# Patient Record
Sex: Male | Born: 1990 | Race: White | Hispanic: No | Marital: Single | Smoking: Current every day smoker
Health system: Southern US, Community
[De-identification: ages and names within clinical notes are randomized; demographics above are authoritative.]

## PROBLEM LIST (undated history)

## (undated) DIAGNOSIS — F1910 Other psychoactive substance abuse, uncomplicated: Secondary | ICD-10-CM

## (undated) DIAGNOSIS — Z789 Other specified health status: Secondary | ICD-10-CM

## (undated) HISTORY — PX: NO PAST SURGERIES: SHX2092

---
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
## 2012-07-11 ENCOUNTER — Emergency Department (HOSPITAL_COMMUNITY)
Admission: EM | Admit: 2012-07-11 | Discharge: 2012-07-11 | Disposition: A | Discharge status: Home / Self Care | Payer: Self-pay | Attending: Emergency Medicine | Admitting: Emergency Medicine

## 2012-07-11 ENCOUNTER — Encounter (HOSPITAL_COMMUNITY): Payer: Self-pay | Admitting: Emergency Medicine

## 2012-07-11 DIAGNOSIS — L02419 Cutaneous abscess of limb, unspecified: Secondary | ICD-10-CM | POA: Insufficient documentation

## 2012-07-11 DIAGNOSIS — M25569 Pain in unspecified knee: Secondary | ICD-10-CM | POA: Insufficient documentation

## 2012-07-11 DIAGNOSIS — L02415 Cutaneous abscess of right lower limb: Secondary | ICD-10-CM

## 2012-07-11 DIAGNOSIS — L02416 Cutaneous abscess of left lower limb: Secondary | ICD-10-CM

## 2012-07-11 DIAGNOSIS — L03119 Cellulitis of unspecified part of limb: Secondary | ICD-10-CM | POA: Insufficient documentation

## 2012-07-11 MED ORDER — CEFTRIAXONE SODIUM 1 G IJ SOLR
1.0000 g | Freq: Once | INTRAMUSCULAR | Status: AC
  Administered 2012-07-11: 1 g via INTRAMUSCULAR
  Filled 2012-07-11: qty 10

## 2012-07-11 MED ORDER — HYDROCODONE-ACETAMINOPHEN 7.5-325 MG PO TABS: 1.0000 | ORAL_TABLET | ORAL | Status: DC | PRN

## 2012-07-11 MED ORDER — HYDROCODONE-ACETAMINOPHEN 5-325 MG PO TABS
2.0000 | ORAL_TABLET | Freq: Once | ORAL | Status: AC
  Administered 2012-07-11: 2 via ORAL
  Filled 2012-07-11: qty 2

## 2012-07-11 MED ORDER — ONDANSETRON HCL 4 MG PO TABS
4.0000 mg | ORAL_TABLET | Freq: Once | ORAL | Status: AC
  Administered 2012-07-11: 4 mg via ORAL
  Filled 2012-07-11: qty 1

## 2012-07-11 MED ORDER — SULFAMETHOXAZOLE-TRIMETHOPRIM 800-160 MG PO TABS: 1.0000 | ORAL_TABLET | Freq: Two times a day (BID) | ORAL | Status: DC

## 2012-07-11 MED ORDER — KETOROLAC TROMETHAMINE 10 MG PO TABS
10.0000 mg | ORAL_TABLET | Freq: Once | ORAL | Status: AC
  Administered 2012-07-11: 10 mg via ORAL
  Filled 2012-07-11: qty 1

## 2012-07-11 MED ORDER — SULFAMETHOXAZOLE-TMP DS 800-160 MG PO TABS
1.0000 | ORAL_TABLET | Freq: Once | ORAL | Status: AC
  Administered 2012-07-11: 1 via ORAL
  Filled 2012-07-11: qty 1

## 2012-07-11 MED ORDER — AMOXICILLIN 500 MG PO CAPS: 500.0000 mg | ORAL_CAPSULE | Freq: Three times a day (TID) | ORAL | Status: DC

## 2012-07-11 NOTE — ED Provider Notes (Signed)
History     CSN: [id number]  Arrival date & time 07/11/12  1927   First MD Initiated Contact with Patient 07/11/12 2206      Chief Complaint  Patient presents with  . Abscess    (Consider location/radiation/quality/duration/timing/severity/associated sxs/prior treatment) HPI Comments: Patient states he has been working around wooded areas and doing tree climbing recently. He thinks that he may have sustained a bite to the lower extremities. He has redness of one area on the right leg and 2 areas on the left. The patient states that he has been squeezing these areas and getting" white pus out of them". He denies any fever or chills. He denies a history of methicillin-resistant staph arias infections. He denies any compromise to his immune system. The patient is able to walk but states that he has soreness particularly of the left knee area. He presents at this time for evaluation and assistance with this problem, particularly the pain.  The history is provided by the patient.    History reviewed. No pertinent past medical history.  History reviewed. No pertinent past surgical history.  History reviewed. No pertinent family history.  History  Substance Use Topics  . Smoking status: Never Smoker   . Smokeless tobacco: Not on file  . Alcohol Use: No      Review of Systems  Constitutional: Negative for activity change.       All ROS Neg except as noted in HPI  HENT: Negative for nosebleeds and neck pain.   Eyes: Negative for photophobia and discharge.  Respiratory: Negative for cough, shortness of breath and wheezing.   Cardiovascular: Negative for chest pain and palpitations.  Gastrointestinal: Negative for abdominal pain and blood in stool.  Genitourinary: Negative for dysuria, frequency and hematuria.  Musculoskeletal: Negative for back pain and arthralgias.  Skin: Positive for wound.  Neurological: Negative for dizziness, seizures and speech difficulty.    Psychiatric/Behavioral: Negative for hallucinations and confusion.    Allergies  Review of patient's allergies indicates not on file.  Home Medications  No current outpatient prescriptions on file.  BP 108/57  Pulse 72  Temp(Src) 97.5 F (36.4 C) (Oral)  Resp 24  Ht 5\' 6"  (1.676 m)  Wt 152 lb (68.947 kg)  BMI 24.55 kg/m2  SpO2 100%  Physical Exam  Nursing note and vitals reviewed. Constitutional: He is oriented to person, place, and time. He appears well-developed and well-nourished.  Non-toxic appearance.  HENT:  Head: Normocephalic.  Right Ear: Tympanic membrane and external ear normal.  Left Ear: Tympanic membrane and external ear normal.  Eyes: EOM and lids are normal. Pupils are equal, round, and reactive to light.  Neck: Normal range of motion. Neck supple. Carotid bruit is not present.  Cardiovascular: Normal rate, regular rhythm, normal heart sounds, intact distal pulses and normal pulses.   Pulmonary/Chest: Breath sounds normal. No respiratory distress.  Abdominal: Soft. Bowel sounds are normal. There is no tenderness. There is no guarding.  Musculoskeletal: Normal range of motion.       Legs: Lymphadenopathy:       Head (right side): No submandibular adenopathy present.       Head (left side): No submandibular adenopathy present.    He has no cervical adenopathy.  Neurological: He is alert and oriented to person, place, and time. He has normal strength. No cranial nerve deficit or sensory deficit.  Skin: Skin is warm and dry.  Psychiatric: He has a normal mood and affect. His speech is normal.  ED Course  Procedures (including critical care time)  Labs Reviewed - No data to display No results found.   No diagnosis found.    MDM  I have reviewed nursing notes, vital signs, and all appropriate lab and imaging results for this patient. Patient has an abscess area of the right leg, and to abscess areas of the left lower extremity. The vital signs are  stable. There is no active drainage at this time. 2 of the areas are scabbed. There is no red streaking noted on examination. There is a lesion of the left lateral knee, but there is no effusion of the knee.  Patient was treated in the emergency department with Rocephin and Bactrim. Prescription for Amoxil and Bactrim given to the patient. Patient has been given an Norco 7.5 mg #20 tablets. Patient will use warm saltwater tub soaks daily. Patient is to return to the emergency department on Thursday, June 12 for recheck of these abscess areas.       Kathie Dike, PA-C 07/11/12 2252

## 2012-07-11 NOTE — ED Notes (Signed)
Pt has red swollen areas to rt ant thigh, lt knee and lt lower leg, present for 3-4 days. Says he "mashed" the area on thigh and "a bunch of white stuff came out".

## 2012-07-11 NOTE — ED Notes (Signed)
Patient has one sore on the upper right leg with swelling and redness and two sores on the lower left leg with swelling and redness x 3 days. No drainage noted from sites at this time.

## 2012-07-13 ENCOUNTER — Emergency Department (HOSPITAL_COMMUNITY)
Admission: EM | Admit: 2012-07-13 | Discharge: 2012-07-13 | Disposition: A | Discharge status: Home / Self Care | Payer: Self-pay | Attending: Emergency Medicine | Admitting: Emergency Medicine

## 2012-07-13 ENCOUNTER — Encounter (HOSPITAL_COMMUNITY): Payer: Self-pay | Admitting: Emergency Medicine

## 2012-07-13 DIAGNOSIS — L02419 Cutaneous abscess of limb, unspecified: Secondary | ICD-10-CM | POA: Insufficient documentation

## 2012-07-13 DIAGNOSIS — L0291 Cutaneous abscess, unspecified: Secondary | ICD-10-CM

## 2012-07-13 MED ORDER — LIDOCAINE HCL (PF) 2 % IJ SOLN
10.0000 mL | Freq: Once | INTRAMUSCULAR | Status: AC
  Administered 2012-07-13: 10 mL
  Filled 2012-07-13: qty 10

## 2012-07-13 NOTE — ED Notes (Signed)
EDP placed dressing to I&D abscess site.

## 2012-07-13 NOTE — ED Notes (Signed)
Pt presents with multiple abscesses to bilateral legs. Pt states was treated once before for this with good results of antibiotic therapy. NAD noted. Denies fever.

## 2012-07-13 NOTE — ED Notes (Signed)
Pt c/o abscesses to left leg x 4 days. Pt states he was seen ED in Monday and received a call this am to return to ED if the abscesses had not "come to a head and popped yet".

## 2012-07-13 NOTE — ED Provider Notes (Signed)
History     CSN: [id number]  Arrival date & time 07/13/12  1437   First MD Initiated Contact with Patient 07/13/12 1606      Chief Complaint  Patient presents with  . Abscess    (Consider location/radiation/quality/duration/timing/severity/associated sxs/prior treatment) HPI Comments: Michael Costa is a 22 y.o. Male presenting for wound check.  He was seen here 2 days ago and treated with several areas on his legs felt to be small abscess site, none which were minimal to I&D.  He was placed on amoxicillin and after, and given a dose of Rocephin during his visit here.  The area on his left lower leg and his right upper thigh have significantly improved, the one left knee continues to be tender, with spreading redness and has also developed a pustule at the site.  He denies fevers or chills.  He can flex his knee with mild discomfort but no significant pain.  He has been using warm Epsom salts several times daily to this wound and is also been using his antibiotics as instructed.     The history is provided by the patient.    History reviewed. No pertinent past medical history.  History reviewed. No pertinent past surgical history.  No family history on file.  History  Substance Use Topics  . Smoking status: Never Smoker   . Smokeless tobacco: Not on file  . Alcohol Use: No      Review of Systems  Constitutional: Negative for fever and chills.  HENT: Negative for facial swelling.   Respiratory: Negative for shortness of breath and wheezing.   Skin: Positive for color change and wound.  Neurological: Negative for numbness.    Allergies  Review of patient's allergies indicates no known allergies.  Home Medications   Current Outpatient Rx  Name  Route  Sig  Dispense  Refill  . amoxicillin (AMOXIL) 500 MG capsule   Oral   Take 1 capsule (500 mg total) by mouth 3 (three) times daily.   21 capsule   0   . HYDROcodone-acetaminophen (NORCO) 7.5-325 MG per tablet  Oral   Take 1 tablet by mouth every 4 (four) hours as needed for pain.   20 tablet   0   . sulfamethoxazole-trimethoprim (SEPTRA DS) 800-160 MG per tablet   Oral   Take 1 tablet by mouth 2 (two) times daily.   14 tablet   0     BP 126/70  Pulse 84  Temp(Src) 98.1 F (36.7 C)  Resp 18  Ht 5\' 6"  (1.676 m)  Wt 148 lb (67.132 kg)  BMI 23.9 kg/m2  SpO2 100%  Physical Exam  Constitutional: He appears well-developed and well-nourished. No distress.  HENT:  Head: Normocephalic and atraumatic.  Eyes: Conjunctivae are normal.  Neck: Neck supple.  Cardiovascular: Normal rate.   Pulmonary/Chest: Effort normal. He has no wheezes.  Musculoskeletal: Normal range of motion. He exhibits no edema.  Skin: There is erythema.  Patient has an area of erythema on his left knee, there is one raised pustule approximately 0.5 cm in length which is intact.  He has a smaller scab superior to this pustule  with surrounding  halo of yellow slightly fluctuant skin also without drainage.  The area erythema had extended beyond the skin marker lines by approximately 3 cm.  There is no red streaking.  He has no knee effusion, and can flex the knee without difficulty.  He has a papule on his left lower leg with  half centimeter surrounding erythema, no fluctuance or induration.  Stating papule on his right upper thigh with erythema which is reduced in size compared to the skin marker pen applied at his previous visit.      ED Course  Procedures (including critical care time)  INCISION AND DRAINAGE Performed by: Burgess Amor Consent: Verbal consent obtained. Risks and benefits: risks, benefits and alternatives were discussed Type: abscess  Body area: left knee  Anesthesia: local infiltration  Incision was made with a scalpel.  Local anesthetic: lidocaine 2% without epinephrine  Anesthetic total: 4 ml  Complexity: complex Blunt dissection to break up loculations  Drainage: purulent  Drainage  amount: Moderate amount of purulent drainage from pustule.  A stab incision was also performed at the scab site with a small amount of purulence drained.    Packing material:  no packing.    Patient tolerance: Patient tolerated the procedure well with no immediate complications.     Labs Reviewed - No data to display No results found.   1. Abscess and cellulitis       MDM  Patient was also seen by Dr. Juleen China are discharge home.  He was encouraged to continue his current antibiotics and to use a soap and water wash twice daily and also a warm Epsom salt soaks several times daily.  Plan to return here in 2 days for recheck if not essentially improved or if symptoms are worsened in any way.       Burgess Amor, PA-C 07/13/12 1728

## 2012-07-14 NOTE — ED Provider Notes (Signed)
Medical screening examination/treatment/procedure(s) were performed by non-physician practitioner and as supervising physician I was immediately available for consultation/collaboration.   Aubry Tucholski M Guadalupe Kerekes, DO 07/14/12 0050 

## 2012-07-19 NOTE — ED Provider Notes (Signed)
Medical screening examination/treatment/procedure(s) were conducted as a shared visit with non-physician practitioner(s) and myself.  I personally evaluated the patient during the encounter.  21yM with small pustules to LLE with mild surrounding cellulitis. Able to range knee easily. Well appearing. Pustules opened. Continued abx and wound care.   Raeford Razor, MD 07/19/12 0900

## 2013-05-10 ENCOUNTER — Encounter (HOSPITAL_COMMUNITY): Payer: Self-pay | Admitting: Emergency Medicine

## 2013-05-10 ENCOUNTER — Emergency Department (HOSPITAL_COMMUNITY)
Admission: EM | Admit: 2013-05-10 | Discharge: 2013-05-10 | Disposition: A | Discharge status: Home / Self Care | Payer: Self-pay | Attending: Emergency Medicine | Admitting: Emergency Medicine

## 2013-05-10 DIAGNOSIS — F17200 Nicotine dependence, unspecified, uncomplicated: Secondary | ICD-10-CM | POA: Insufficient documentation

## 2013-05-10 DIAGNOSIS — Z4801 Encounter for change or removal of surgical wound dressing: Secondary | ICD-10-CM | POA: Insufficient documentation

## 2013-05-10 DIAGNOSIS — Z5189 Encounter for other specified aftercare: Secondary | ICD-10-CM

## 2013-05-10 NOTE — ED Notes (Signed)
PT [facility] PT HERE TO SEE IF HE NEEDS AND ANTIBIOTIC.

## 2013-05-10 NOTE — ED Provider Notes (Signed)
CSN: [id number]     Arrival date & time 05/10/13  1901 History   First MD Initiated Contact with Patient 05/10/13 2020     Chief Complaint  Patient presents with  . Wound Check     (Consider location/radiation/quality/duration/timing/severity/associated sxs/prior Treatment) HPI Comments: Patient is a 23 year old male who presents to the emergency department with a complaint of wanting to have his wound checked. The patient states that he fell and sustained a" deep" laceration to the right lower back on Friday, April 3. [facility]. The wound was stapled. The patient states he wondered to have it checked to see if he needed an antibiotic and if the staples were in place. He denies any high fever. He has had minimal drainage from the stapled site. His been no new injury to the stapled area.   Patient is a 23 y.o. male presenting with wound check. The history is provided by the patient.  Wound Check Pertinent negatives include no abdominal pain, arthralgias, chest pain, coughing or neck pain.    History reviewed. No pertinent past medical history. History reviewed. No pertinent past surgical history. History reviewed. No pertinent family history. History  Substance Use Topics  . Smoking status: Current Every Day Smoker  . Smokeless tobacco: Not on file  . Alcohol Use: No    Review of Systems  Constitutional: Negative for activity change.       All ROS Neg except as noted in HPI  HENT: Negative for nosebleeds.   Eyes: Negative for photophobia and discharge.  Respiratory: Negative for cough, shortness of breath and wheezing.   Cardiovascular: Negative for chest pain and palpitations.  Gastrointestinal: Negative for abdominal pain and blood in stool.  Genitourinary: Negative for dysuria, frequency and hematuria.  Musculoskeletal: Negative for arthralgias, back pain and neck pain.  Skin: Positive for wound.  Neurological: Negative for dizziness, seizures and  speech difficulty.  Psychiatric/Behavioral: Negative for hallucinations and confusion.      Allergies  Review of patient's allergies indicates no known allergies.  Home Medications  No current outpatient prescriptions on file. BP 123/56  Pulse 97  Temp(Src) 97.9 F (36.6 C) (Oral)  Resp 24  Ht 5\' 6"  (1.676 m)  Wt 165 lb (74.844 kg)  BMI 26.64 kg/m2  SpO2 97% Physical Exam  Nursing note and vitals reviewed. Constitutional: He is oriented to person, place, and time. He appears well-developed and well-nourished.  Non-toxic appearance.  HENT:  Head: Normocephalic.  Right Ear: Tympanic membrane and external ear normal.  Left Ear: Tympanic membrane and external ear normal.  Eyes: EOM and lids are normal. Pupils are equal, round, and reactive to light.  Neck: Normal range of motion. Neck supple. Carotid bruit is not present.  Cardiovascular: Normal rate, regular rhythm, normal heart sounds, intact distal pulses and normal pulses.   Pulmonary/Chest: Breath sounds normal. No respiratory distress.  Abdominal: Soft. Bowel sounds are normal. There is no tenderness. There is no guarding.  Musculoskeletal: Normal range of motion.  The stapled wound to the right lower back is healing nicely. There is good granulation going on. The staples are intact. There is no drainage. There is no red streaking up appreciated. The area around the stapled wound is not hot.  Lymphadenopathy:       Head (right side): No submandibular adenopathy present.       Head (left side): No submandibular adenopathy present.    He has no cervical adenopathy.  Neurological: He is alert  and oriented to person, place, and time. He has normal strength. No cranial nerve deficit or sensory deficit.  Skin: Skin is warm and dry.  Psychiatric: He has a normal mood and affect. His speech is normal.    ED Course  Procedures (including critical care time) Labs Review Labs Reviewed - No data to display Imaging Review No  results found.   EKG Interpretation None      MDM Patient presents to the emergency department to have his wound of the right lower back recheck. The patient was concerned as if he may need antibiotics because he felt that the laceration was" quite deep". The vital signs are well within normal limits. There is no red streaking or signs of infection or cellulitis at the site. The wound is healing nicely, and the staples are intact.  A dressing applied, and patient reassured of no evidence of infection at this time.    Final diagnoses:  None    **I have reviewed nursing notes, vital signs, and all appropriate lab and imaging results for this patient.Kathie Dike, PA-C 05/11/13 2008

## 2013-05-10 NOTE — Discharge Instructions (Signed)
Your vital signs are well within normal limits. There is no evidence of infection on tonight's examination. Please change the dressing daily. Please have the staples removed as [facility]. Wound Check Your wound appears healthy today. Your wound will heal gradually over time. Eventually a scar will form that will fade with time. FACTORS THAT AFFECT SCAR FORMATION:  People differ in the severity in which they scar.  Scar severity varies according to location, size, and the traits you inherited from your parents (genetic predisposition).  Irritation to the wound from infection, rubbing, or chemical exposure will increase the amount of scar formation. HOME CARE INSTRUCTIONS   If you were given a dressing, you should change it at least once a day or as instructed by your caregiver. If the bandage sticks, soak it off with a solution of hydrogen peroxide.  If the bandage becomes wet, dirty, or develops a bad smell, change it as soon as possible.  Look for signs of infection.  Only take over-the-counter or prescription medicines for pain, discomfort, or fever as directed by your caregiver. SEEK IMMEDIATE MEDICAL CARE IF:   You have redness, swelling, or increasing pain in the wound.  You notice pus coming from the wound.  You have a fever.  You notice a bad smell coming from the wound or dressing. Document Released: 10/26/2003 Document Revised: 04/13/2011 Document Reviewed: 01/19/2005 [facility] Patient Information 2014 [employer] [place].

## 2013-05-12 NOTE — ED Provider Notes (Signed)
Medical screening examination/treatment/procedure(s) were performed by non-physician practitioner and as supervising physician I was immediately available for consultation/collaboration.   EKG Interpretation None        Deva Ron L Dezhane Staten, MD 05/12/13 1530 

## 2013-09-05 ENCOUNTER — Encounter (HOSPITAL_COMMUNITY): Payer: Self-pay | Admitting: Emergency Medicine

## 2013-09-05 ENCOUNTER — Emergency Department (HOSPITAL_COMMUNITY)
Admission: EM | Admit: 2013-09-05 | Discharge: 2013-09-06 | Disposition: A | Discharge status: Home / Self Care | Payer: No Typology Code available for payment source | Attending: Emergency Medicine | Admitting: Emergency Medicine

## 2013-09-05 ENCOUNTER — Emergency Department (HOSPITAL_COMMUNITY): Payer: No Typology Code available for payment source

## 2013-09-05 DIAGNOSIS — S0181XA Laceration without foreign body of other part of head, initial encounter: Secondary | ICD-10-CM

## 2013-09-05 DIAGNOSIS — S199XXA Unspecified injury of neck, initial encounter: Secondary | ICD-10-CM

## 2013-09-05 DIAGNOSIS — Y9389 Activity, other specified: Secondary | ICD-10-CM | POA: Insufficient documentation

## 2013-09-05 DIAGNOSIS — F17200 Nicotine dependence, unspecified, uncomplicated: Secondary | ICD-10-CM | POA: Insufficient documentation

## 2013-09-05 DIAGNOSIS — Y92410 Unspecified street and highway as the place of occurrence of the external cause: Secondary | ICD-10-CM | POA: Insufficient documentation

## 2013-09-05 DIAGNOSIS — F1320 Sedative, hypnotic or anxiolytic dependence, uncomplicated: Secondary | ICD-10-CM | POA: Insufficient documentation

## 2013-09-05 DIAGNOSIS — S0993XA Unspecified injury of face, initial encounter: Secondary | ICD-10-CM | POA: Insufficient documentation

## 2013-09-05 DIAGNOSIS — F1110 Opioid abuse, uncomplicated: Secondary | ICD-10-CM | POA: Insufficient documentation

## 2013-09-05 DIAGNOSIS — M542 Cervicalgia: Secondary | ICD-10-CM

## 2013-09-05 DIAGNOSIS — S80812A Abrasion, left lower leg, initial encounter: Secondary | ICD-10-CM

## 2013-09-05 DIAGNOSIS — S0990XA Unspecified injury of head, initial encounter: Secondary | ICD-10-CM | POA: Insufficient documentation

## 2013-09-05 DIAGNOSIS — S058X9A Other injuries of unspecified eye and orbit, initial encounter: Secondary | ICD-10-CM | POA: Insufficient documentation

## 2013-09-05 DIAGNOSIS — F1210 Cannabis abuse, uncomplicated: Secondary | ICD-10-CM | POA: Insufficient documentation

## 2013-09-05 DIAGNOSIS — IMO0002 Reserved for concepts with insufficient information to code with codable children: Secondary | ICD-10-CM | POA: Insufficient documentation

## 2013-09-05 LAB — RAPID URINE DRUG SCREEN, HOSP PERFORMED
AMPHETAMINES: NOT DETECTED
BENZODIAZEPINES: POSITIVE — AB
Barbiturates: NOT DETECTED
Cocaine: NOT DETECTED
OPIATES: POSITIVE — AB
Tetrahydrocannabinol: POSITIVE — AB

## 2013-09-05 MED ORDER — LIDOCAINE-EPINEPHRINE 1 %-1:100000 IJ SOLN
10.0000 mL | Freq: Once | INTRAMUSCULAR | Status: DC
  Filled 2013-09-05: qty 10

## 2013-09-05 MED ORDER — LIDOCAINE-EPINEPHRINE-TETRACAINE (LET) SOLUTION
3.0000 mL | Freq: Once | NASAL | Status: AC
  Administered 2013-09-05: 3 mL via TOPICAL
  Filled 2013-09-05: qty 3

## 2013-09-05 MED ORDER — LIDOCAINE HCL (PF) 1 % IJ SOLN
5.0000 mL | Freq: Once | INTRAMUSCULAR | Status: AC
  Administered 2013-09-06: 5 mL
  Filled 2013-09-05: qty 5
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
## 2013-09-05 NOTE — ED Notes (Signed)
Went in to do vitals and rounding [facility]

## 2013-09-05 NOTE — ED Notes (Addendum)
Pt states he does not feel the need to give a urine sample that they have already drawn blood. Family trying to get pt to give a sample.
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
## 2013-09-06 MED ORDER — CYCLOBENZAPRINE HCL 10 MG PO TABS: 10.0000 mg | ORAL_TABLET | Freq: Three times a day (TID) | ORAL | Status: DC | PRN

## 2013-09-06 MED ORDER — NAPROXEN 500 MG PO TABS: 500.0000 mg | ORAL_TABLET | Freq: Two times a day (BID) | ORAL | Status: DC
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
## 2013-09-06 NOTE — ED Notes (Signed)
Removed c collar as per Dr. Lynelle DoctorKnapp and gave patient something to drink. JGW

## 2013-09-06 NOTE — Discharge Instructions (Signed)
Ice packs to the injured or sore muscles and also use heat to relax your sore muscles.  Take the medications for pain and muscle spasms. Return to the ED for any problems listed on the head injury sheet. Recheck if you aren't improving in the next week. The sutures need to be removed in 3-5 days. Use antibiotic ointment on the wound.

## 2013-09-06 NOTE — ED Notes (Signed)
Pt gave permission for mother to see drug screen results.

## 2013-10-09 ENCOUNTER — Emergency Department (HOSPITAL_COMMUNITY)
Admission: EM | Admit: 2013-10-09 | Discharge: 2013-10-09 | Disposition: A | Discharge status: Home / Self Care | Payer: Self-pay | Attending: Emergency Medicine | Admitting: Emergency Medicine

## 2013-10-09 ENCOUNTER — Emergency Department (HOSPITAL_COMMUNITY): Payer: Self-pay

## 2013-10-09 ENCOUNTER — Encounter (HOSPITAL_COMMUNITY): Payer: Self-pay | Admitting: Emergency Medicine

## 2013-10-09 DIAGNOSIS — M25473 Effusion, unspecified ankle: Secondary | ICD-10-CM | POA: Insufficient documentation

## 2013-10-09 DIAGNOSIS — F17200 Nicotine dependence, unspecified, uncomplicated: Secondary | ICD-10-CM | POA: Insufficient documentation

## 2013-10-09 DIAGNOSIS — M79609 Pain in unspecified limb: Secondary | ICD-10-CM | POA: Insufficient documentation

## 2013-10-09 DIAGNOSIS — M25476 Effusion, unspecified foot: Secondary | ICD-10-CM | POA: Insufficient documentation

## 2013-10-09 DIAGNOSIS — M79671 Pain in right foot: Secondary | ICD-10-CM

## 2013-10-09 MED ORDER — IBUPROFEN 800 MG PO TABS
800.0000 mg | ORAL_TABLET | Freq: Once | ORAL | Status: AC
  Administered 2013-10-09: 800 mg via ORAL
  Filled 2013-10-09: qty 1

## 2013-10-09 MED ORDER — IBUPROFEN 800 MG PO TABS: 800.0000 mg | ORAL_TABLET | Freq: Three times a day (TID) | ORAL | Status: DC

## 2013-10-09 NOTE — ED Notes (Signed)
Pt c/o right foot swelling and pain for past few days, denies injury to foot

## 2013-10-09 NOTE — Discharge Instructions (Signed)
Your x-rays are negative for fracture or dislocation. Please keep your foot elevated above your waist is much as possible. Please see Dr. Hilda Lias for additional evaluation and management. Use ibuprofen 3 times daily with food.

## 2013-10-09 NOTE — ED Provider Notes (Signed)
CSN: [id number]     Arrival date & time 10/09/13  2056 History   First MD Initiated Contact with Patient 10/09/13 2114     Chief Complaint  Patient presents with  . Foot Pain     (Consider location/radiation/quality/duration/timing/severity/associated sxs/prior Treatment) HPI Comments: Patient presents to the emergency department with complaint of right foot swelling and pain for the past few days. The patient denies any recent injury to the right foot. He does not recall any insect bites. He states that he has pain on the top of his foot and also on the side of his foot behind his little toe. He denies using any uncomfortable shoes. He has not had this problem previously. He has not had any operations or procedures involving the right foot. He presents now for evaluation of this issue.  The history is provided by the patient.    History reviewed. No pertinent past medical history. History reviewed. No pertinent past surgical history. History reviewed. No pertinent family history. History  Substance Use Topics  . Smoking status: Current Every Day Smoker    Types: Cigarettes  . Smokeless tobacco: Not on file  . Alcohol Use: No    Review of Systems  Constitutional: Negative for activity change.       All ROS Neg except as noted in HPI  HENT: Negative for congestion, ear pain and nosebleeds.   Eyes: Negative for photophobia and discharge.  Respiratory: Negative for cough, shortness of breath and wheezing.   Cardiovascular: Negative for chest pain and palpitations.  Gastrointestinal: Negative for abdominal pain and blood in stool.  Endocrine: Negative.   Genitourinary: Negative for dysuria, frequency and hematuria.  Musculoskeletal: Negative for arthralgias, back pain and neck pain.  Skin: Negative.   Neurological: Negative for dizziness, seizures and speech difficulty.  Psychiatric/Behavioral: Negative for hallucinations and confusion.      Allergies  Review of patient's  allergies indicates no known allergies.  Home Medications   Prior to Admission medications   Medication Sig Start Date End Date Taking? Authorizing Provider  ibuprofen (ADVIL,MOTRIN) 200 MG tablet Take 400 mg by mouth every 6 (six) hours as needed for headache or moderate pain.   Yes Historical Provider, MD   BP 136/80  Pulse 88  Temp(Src) 98.3 F (36.8 C) (Oral)  Resp 18  Ht  (1.676 m)  Wt 127 lb (57.607 kg)  BMI 20.51 kg/m2  SpO2 99% Physical Exam  Nursing note and vitals reviewed. Constitutional: He is oriented to person, place, and time. He appears well-developed and well-nourished.  Non-toxic appearance.  HENT:  Head: Normocephalic.  Right Ear: Tympanic membrane and external ear normal.  Left Ear: Tympanic membrane and external ear normal.  Eyes: EOM and lids are normal. Pupils are equal, round, and reactive to light.  Neck: Normal range of motion. Neck supple. Carotid bruit is not present.  Cardiovascular: Normal rate, regular rhythm, normal heart sounds, intact distal pulses and normal pulses.   Pulmonary/Chest: Breath sounds normal. No respiratory distress.  Abdominal: Soft. Bowel sounds are normal. There is no tenderness. There is no guarding.  Musculoskeletal: Normal range of motion.  There is soft tissue and bony prominence of the dorsum of the right foot. The dorsalis pedis and posterior tibial pulses are 2+. Capillary refill is less than 2 seconds. There is soft tissue prominence of the lateral right foot at the mid metatarsal area. There is no puncture wound of the plantar surface. No lesions noted between the toes. The Achilles  tendon is intact.  Lymphadenopathy:       Head (right side): No submandibular adenopathy present.       Head (left side): No submandibular adenopathy present.    He has no cervical adenopathy.  Neurological: He is alert and oriented to person, place, and time. He has normal strength. No cranial nerve deficit or sensory deficit.  Skin:  Skin is warm and dry.  Psychiatric: He has a normal mood and affect. His speech is normal.    ED Course  Procedures (including critical care time) Labs Review Labs Reviewed - No data to display  Imaging Review Dg Foot Complete Right  10/09/2013   CLINICAL DATA:  Right foot pain and swelling.  EXAM: RIGHT FOOT COMPLETE - 3+ VIEW  COMPARISON:  10/08/2013  FINDINGS: There is no evidence of fracture or dislocation. There is no evidence of arthropathy or other focal bone abnormality. Soft tissues are unremarkable.  IMPRESSION: Negative.   Electronically Signed   By: Burman Nieves M.D.   On: 10/09/2013 21:26     EKG Interpretation None      MDM X-ray of the right foot is negative for fracture or dislocation. Soft tissues are unremarkable. Vital signs are well within normal limits. The patient is wrapped in an Ace wrap, placed in a postoperative shoe, and referred to orthopedics for additional evaluation. Patient reassured that the x-ray was okay has no signs of infection or other acute issues at this time.    Final diagnoses:  None    **I have reviewed nursing notes, vital signs, and all appropriate lab and imaging results for this patient.Kathie Dike, PA-C 10/09/13 2354

## 2013-10-09 NOTE — ED Notes (Signed)
Discharge instructions and prescription given to patient.  Patient verbalized understanding to take medication as directed and keep right foot elevated.  Patient ambulatory; discharged home in good condition.

## 2013-10-10 NOTE — ED Provider Notes (Signed)
Medical screening examination/treatment/procedure(s) were performed by non-physician practitioner and as supervising physician I was immediately available for consultation/collaboration.     Christiano Blandon, MD 10/10/13 1848 

## 2013-12-01 ENCOUNTER — Emergency Department (HOSPITAL_COMMUNITY)
Admission: EM | Admit: 2013-12-01 | Discharge: 2013-12-02 | Disposition: A | Discharge status: Home / Self Care | Payer: Self-pay | Attending: Emergency Medicine | Admitting: Emergency Medicine

## 2013-12-01 ENCOUNTER — Encounter (HOSPITAL_COMMUNITY): Payer: Self-pay | Admitting: Emergency Medicine

## 2013-12-01 DIAGNOSIS — F11220 Opioid dependence with intoxication, uncomplicated: Secondary | ICD-10-CM

## 2013-12-01 DIAGNOSIS — Z791 Long term (current) use of non-steroidal anti-inflammatories (NSAID): Secondary | ICD-10-CM | POA: Insufficient documentation

## 2013-12-01 DIAGNOSIS — F1120 Opioid dependence, uncomplicated: Secondary | ICD-10-CM | POA: Insufficient documentation

## 2013-12-01 DIAGNOSIS — Z720 Tobacco use: Secondary | ICD-10-CM | POA: Insufficient documentation

## 2013-12-01 NOTE — ED Notes (Signed)
Patient states he was scheduled to "pull time in jail tonight" patient states he wants to get off drugs, and patient states he has suicidal ideation. Patient denies having a plan. Patient states "ive thought about it, but im not going to hurt myself" patient states he has been on drugs for years, and wants to stop tonight. Patient is calm cooperative A&OX4 mother is with patient at this time.

## 2013-12-02 LAB — COMPREHENSIVE METABOLIC PANEL
ALBUMIN: 3.7 g/dL (ref 3.5–5.2)
ALT: 14 U/L (ref 0–53)
AST: 19 U/L (ref 0–37)
Alkaline Phosphatase: 57 U/L (ref 39–117)
Anion gap: 9 (ref 5–15)
BUN: 8 mg/dL (ref 6–23)
CO2: 31 mEq/L (ref 19–32)
CREATININE: 0.91 mg/dL (ref 0.50–1.35)
Calcium: 9.3 mg/dL (ref 8.4–10.5)
Chloride: 99 mEq/L (ref 96–112)
GFR calc Af Amer: >90 mL/min (ref >90)
GFR calc non Af Amer: >90 mL/min (ref >90)
Glucose, Bld: 86 mg/dL (ref 70–99)
Potassium: 3.9 mEq/L (ref 3.7–5.3)
Sodium: 139 mEq/L (ref 137–147)
TOTAL PROTEIN: 6.5 g/dL (ref 6.0–8.3)
Total Bilirubin: 0.2 mg/dL — ABNORMAL LOW (ref 0.3–1.2)

## 2013-12-02 LAB — CBC
HEMATOCRIT: 37.3 % — AB (ref 39.0–52.0)
Hemoglobin: 12.8 g/dL — ABNORMAL LOW (ref 13.0–17.0)
MCH: 30.8 pg (ref 26.0–34.0)
MCHC: 34.3 g/dL (ref 30.0–36.0)
MCV: 89.7 fL (ref 78.0–100.0)
Platelets: 281 10*3/uL (ref 150–400)
RBC: 4.16 MIL/uL — AB (ref 4.22–5.81)
RDW: 13.6 % (ref 11.5–15.5)
WBC: 9.2 10*3/uL (ref 4.0–10.5)

## 2013-12-02 LAB — RAPID URINE DRUG SCREEN, HOSP PERFORMED
Amphetamines: POSITIVE — AB
Barbiturates: NOT DETECTED
Benzodiazepines: POSITIVE — AB
COCAINE: NOT DETECTED
OPIATES: NOT DETECTED
Tetrahydrocannabinol: NOT DETECTED

## 2013-12-02 LAB — SALICYLATE LEVEL: Salicylate Lvl: <2 mg/dL — ABNORMAL LOW (ref 2.8–20.0)

## 2013-12-02 LAB — ETHANOL: Alcohol, Ethyl (B): <11 mg/dL (ref 0–11)

## 2013-12-02 LAB — ACETAMINOPHEN LEVEL: Acetaminophen (Tylenol), Serum: <15 ug/mL (ref 10–30)
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
## 2013-12-02 NOTE — ED Notes (Signed)
Pt reported to physician that he "was not really suicidal, but that is what my mother told me to say so I would be seen."   Per EDP.  Suicide precautions determined not necessary after assessment was completed.

## 2013-12-02 NOTE — BH Assessment (Signed)
Assessment completed. Consulted Nanine MeansJamison Lord, NP who recommended that pt be given outpatient resources for substance abuse treatment. Dr. Read DriversMolpus has been informed of the recommendation.

## 2013-12-02 NOTE — ED Notes (Signed)
TTS underway.  

## 2013-12-02 NOTE — ED Provider Notes (Signed)
CSN: [id number]     Arrival date & time 12/01/13  2240 History  This chart was scribed for Michael SeamenJohn L Billiejean Schimek, MD by Gwenyth Oberatherine Macek, ED Scribe. This patient was seen in room APA03/APA03 and the patient's care was started at 12:24 AM.    Chief Complaint  Patient presents with  . Suicidal   The history is provided by the patient. No language interpreter was used.   HPI Comments:  Michael Costa is a 23 y.o. male who presents to the Emergency Department because he wants to detox from marijuana and pain pill use. Pt states that he started taking pain pills PO every 2-3 days starting 2.5 years ago. He denies EtOH and cocaine use. Pt last used pills yesterday. Pt said he went to jail to get clean, but that they were full. He denies thoughts of suicide and acknowledges telling triage nurse about SI because his mother told him to claim SI so the ED would admit him. He denies pain, nausea or vomiting as an associated symptoms. He does complain of feeling anxious and shaky, commonly upon wakening.   Past Medical History  Diagnosis Date  . Drug abuse    History reviewed. No pertinent past surgical history. History reviewed. No pertinent family history. History  Substance Use Topics  . Smoking status: Current Every Day Smoker -- 1.00 packs/day    Types: Cigarettes  . Smokeless tobacco: Not on file  . Alcohol Use: No    Review of Systems  10 Systems reviewed and all are negative for acute change except as noted in the HPI.   Allergies  Review of patient's allergies indicates no known allergies.  Home Medications   Prior to Admission medications   Medication Sig Start Date End Date Taking? Authorizing Provider  ibuprofen (ADVIL,MOTRIN) 200 MG tablet Take 400 mg by mouth every 6 (six) hours as needed for headache or moderate pain.    Historical Provider, MD  ibuprofen (ADVIL,MOTRIN) 800 MG tablet Take 1 tablet (800 mg total) by mouth 3 (three) times daily. 10/09/13   Kathie DikeHobson M Bryant, PA-C   BP  114/68  Pulse 86  Temp(Src) 98.2 F (36.8 C) (Oral)  Resp 20  Ht 5\' 8"  (1.727 m)  Wt 156 lb (70.761 kg)  BMI 23.73 kg/m2  SpO2 100% Physical Exam  Nursing note and vitals reviewed. General: Well-developed, well-nourished male in no acute distress; appearance consistent with age of record HENT: normocephalic; atraumatic Eyes: pupils equal, round and reactive to light; extraocular muscles intact Neck: supple Heart: regular rate and rhythm; no murmurs, rubs or gallops Lungs: clear to auscultation bilaterally Abdomen: Somnolent but arousable; nontender; no masses or hepatosplenomegaly; bowel sounds present Extremities: No deformity; full range of motion; pulses normal Neurologic: Awake, alert and oriented; motor function intact in all extremities and symmetric; no facial droop Skin: Warm and dry Psychiatric: Normal mood and affect; denies SI or HI  ED Course  Procedures (including critical care time) DIAGNOSTIC STUDIES: Oxygen Saturation is 100% on RA, normal by my interpretation.    COORDINATION OF CARE: 12:35 AM Discussed treatment plan with pt at bedside and pt agreed to plan.  MDM  Nursing notes and vitals signs, including pulse oximetry, reviewed.  Summary of this visit's results, reviewed by myself:  Labs:  Results for orders placed during the hospital encounter of 12/01/13 (from the past 24 hour(s))  CBC     Status: Abnormal   Collection Time    12/02/13 12:51 AM  Result Value Ref Range   WBC 9.2  4.0 - 10.5 K/uL   RBC 4.16 (*) 4.22 - 5.81 MIL/uL   Hemoglobin 12.8 (*) 13.0 - 17.0 g/dL   HCT 16.137.3 (*) 09.639.0 - 04.552.0 %   MCV 89.7  78.0 - 100.0 fL   MCH 30.8  26.0 - 34.0 pg   MCHC 34.3  30.0 - 36.0 g/dL   RDW 40.913.6  81.111.5 - 91.415.5 %   Platelets 281  150 - 400 K/uL  COMPREHENSIVE METABOLIC PANEL     Status: Abnormal   Collection Time    12/02/13 12:51 AM      Result Value Ref Range   Sodium 139  137 - 147 mEq/L   Potassium 3.9  3.7 - 5.3 mEq/L   Chloride 99  96 -  112 mEq/L   CO2 31  19 - 32 mEq/L   Glucose, Bld 86  70 - 99 mg/dL   BUN 8  6 - 23 mg/dL   Creatinine, Ser 7.820.91  0.50 - 1.35 mg/dL   Calcium 9.3  8.4 - 95.610.5 mg/dL   Total Protein 6.5  6.0 - 8.3 g/dL   Albumin 3.7  3.5 - 5.2 g/dL   AST 19  0 - 37 U/L   ALT 14  0 - 53 U/L   Alkaline Phosphatase 57  39 - 117 U/L   Total Bilirubin 0.2 (*) 0.3 - 1.2 mg/dL   GFR calc non Af Amer >90  >90 mL/min   GFR calc Af Amer >90  >90 mL/min   Anion gap 9  5 - 15  ETHANOL     Status: None   Collection Time    12/02/13 12:51 AM      Result Value Ref Range   Alcohol, Ethyl (B) <11  0 - 11 mg/dL  ACETAMINOPHEN LEVEL     Status: None   Collection Time    12/02/13 12:51 AM      Result Value Ref Range   Acetaminophen (Tylenol), Serum <15.0  10 - 30 ug/mL  SALICYLATE LEVEL     Status: Abnormal   Collection Time    12/02/13 12:51 AM      Result Value Ref Range   Salicylate Lvl <2.0 (*) 2.8 - 20.0 mg/dL  URINE RAPID DRUG SCREEN (HOSP PERFORMED)     Status: Abnormal   Collection Time    12/02/13  1:00 AM      Result Value Ref Range   Opiates NONE DETECTED  NONE DETECTED   Cocaine NONE DETECTED  NONE DETECTED   Benzodiazepines POSITIVE (*) NONE DETECTED   Amphetamines POSITIVE (*) NONE DETECTED   Tetrahydrocannabinol NONE DETECTED  NONE DETECTED   Barbiturates NONE DETECTED  NONE DETECTED   2:15 AM Patient assessed by TTS. He emphatically denied any suicidal thoughts. We will discharge him with referrals.   I personally performed the services described in this documentation, which was scribed in my presence. The recorded information has been reviewed and is accurate.   Michael SeamenJohn L Kirstine Jacquin, MD 12/02/13 [phone]

## 2013-12-02 NOTE — Discharge Instructions (Signed)
°Emergency Department Resource Guide °1) Find a Doctor and Pay Out of Pocket °Although you won't have to find out who is covered by your insurance plan, it is a good idea to ask around and get recommendations. You will then need to call the office and see if the doctor you have chosen will accept you as a new patient and what types of options they offer for patients who are self-pay. Some doctors offer discounts or will set up payment plans for their patients who do not have insurance, but you will need to ask so you aren't surprised when you get to your appointment. ° °2) Contact Your Local Health Department °Not all health departments have doctors that can see patients for sick visits, but many do, so it is worth a call to see if yours does. If you don't know where your local health department is, you can check in your phone book. The CDC also has a tool to help you locate your state's health department, and many state websites also have listings of all of their local health departments. ° °3) Find a Walk-in Clinic °If your illness is not likely to be very severe or complicated, you may want to try a walk in clinic. These are popping up all over the country in pharmacies, drugstores, and shopping centers. They're usually staffed by nurse practitioners or physician assistants that have been trained to treat common illnesses and complaints. They're usually fairly quick and inexpensive. However, if you have serious medical issues or chronic medical problems, these are probably not your best option. ° °No Primary Care Doctor: °- Call [facility] at  832-8000 - they can help you locate a primary care doctor that  accepts your insurance, provides certain services, etc. °- Physician Referral Service- 1-800-533-3463 ° °Chronic Pain Problems: °Organization         Address  Phone   Notes  °[facility]  (336) 297-2271 Patients need to be referred by their primary care doctor.  ° °Medication  Assistance: °Organization         Address  Phone   Notes  °[place] [facility] [address] °[place], [place] [address] (336) 641-8030 --Must be a resident of [place] °-- Must have NO insurance coverage whatsoever (no Medicaid/ Medicare, etc.) °-- The pt. MUST have a primary care doctor that directs their care regularly and follows them in the community °  °[facility]  (866) 331-1348   °[employer]  (888) 892-1162   ° °Agencies that provide inexpensive medical care: °Organization         Address  Phone   Notes  °[facility]  (336) 832-8035   °[facility]    (336) 832-7272   °[facility] [address] °[place], [place] [address] (336) 832-4777   °[facility] [address], °[place] (336) 271-4999   °[facility]    (336) 373-0678   °[facility]    (336) 272-1050   °[facility] ° [address], [place] Phone:  (336) 832-4444, Fax:  (336) 832-4440 Hours of Operation:  9 am - 6 pm, M-F.  Also accepts Medicaid/Medicare and self-pay.  °[facility] ° [address], [place] Phone: (336) 832-3150, Fax: (336) [phone]. Hours of Operation:  8:30 am - 5:30 pm, M-F.  Also accepts Medicaid and self-pay.  °[facility] [place] [address], [place] Phone: (336) 878-6027   °[facility] [address], [place], [place] (336)723-1848, Ext. 123 Mondays & Thursdays: 7-9 AM.  First 15 patients are seen on a first come, first serve basis. °  ° °Medicaid-accepting [place] Providers: ° °Organization         Address  Phone   Notes  °[facility] [address], [place] (336) 641-2100 Also accepts self-pay patients.  °[facility] [address], [place] ° (336) 856-9996   °[facility] [address], [place]  (336) 288-8857   °[facility] [address] [place] [address], [place] (336) 299-7000   °Veita Bland [address], [place]  ° (336) 373-1557 Only accepts [place] Access Medicaid patients after they have their name applied to their card.  ° °Self-Pay (no insurance) in [place]: ° °Organization         Address  Phone   Notes  °Sickle Cell Patients, [facility] [address], [place] (336) 832-1970   °[facility] [address], [place] (336) 832-4400   °[facility] ° [address], [place] (336) 992-4800   °[facility]/Dr. Osei-Bonsu ° [address] [place] [address], [place] or [address], [place] (336) 841-8500 Phone number for both [place] and [place] locations is the same.  °[facility] [address], [place] (336) 299-0000   °[facility] [address] [place] [address], [place] or [address] (336) 852-7530 °(336) 878-2260   °[facility] [address], [place] (336) 350-1642, phone; (336) 294-5005, fax Sees patients 1st and 3rd Saturday of every month.  Must not qualify for public or private insurance (i.e. Medicaid, Medicare, [place] Health Choice, Veterans' Benefits) • Household income should be no more than 200% of the poverty level •The clinic cannot treat you if you are pregnant or think you are pregnant • Sexually transmitted diseases are not treated at the clinic.  ° ° °Dental Care: °Organization         Address  Phone  Notes  °[place] [facility] [address],  (336) 641-6152 Accepts children up to age 21 who are enrolled in Medicaid or [place] Health Choice; pregnant women with a Medicaid card; and children who have applied for Medicaid or [place] Health Choice, but were declined, whose parents can pay a reduced fee at time of service.  °[place]  [facility] [place]  [address], [place] (336) 641-7733 Accepts children up to age 21 who are enrolled in Medicaid or [place] Health Choice; pregnant women with a Medicaid card; and children who have applied for Medicaid or [place] Health Choice, but were declined, whose parents can pay a reduced fee at time of service.  °[facility] ° [address],  (336) 641-4533 Patients are seen by appointment only. Walk-ins are not accepted. [facility] will see patients 18 years of age and older. °Monday - Tuesday (8am-5pm) °Most Wednesdays (8:30-5pm) °$30 per visit, cash only  °[facility] ° [address], [place] (336) 641-4533 Patients are seen by appointment only. Walk-ins are not accepted. [facility] will see patients 18 years of age and older. °One   Wednesday Evening (Monthly: Volunteer Based).  $30 per visit, cash only  °[facility]  (919) 537-3737 for adults; Children under age 4, call Graduate Pediatric Dentistry at (919) [phone]. Children aged 4-14, please call (919) [phone] to request a pediatric application. ° Dental services are provided in all areas of dental care including fillings, crowns and bridges, complete and partial dentures, implants, gum treatment, root canals, and extractions. Preventive care is also provided. Treatment is provided to both adults and children. °Patients are selected via a lottery and there is often a waiting list. °  °[facility] [address], °[place] ° (336) 763-8833 [email or web address] °  °[facility] [address], [place], [place] (336)723-1848, Ext. 123 Second and Fourth Thursday of each month, opens at 6:30 AM; Clinic ends at 9 AM.  Patients are seen on a first-come first-served basis, and a limited number are seen during each clinic.  ° °[facility] ° [address], [place], [place] (336) 723-7904    Eligibility Requirements °You must have lived in [place], [place], or [place] counties for at least the last three months. °  You cannot be eligible for state or federal sponsored healthcare insurance, including Veterans Administration, Medicaid, or Medicare. °  You generally cannot be eligible for healthcare insurance through your employer.  °  How to apply: °Eligibility screenings are held every Tuesday and Wednesday afternoon from 1:00 pm until 4:00 pm. You do not need an appointment for the interview!  °[facility] [address], [place], [place] 336-631-2330   °[facility]  336-342-8273   °[facility]  336-703-3100   °[facility]  336-570-6415   ° °Behavioral Health Resources in the Community: °Intensive Outpatient Programs °Organization         Address  Phone  Notes  °[facility] [address], [place], [place] 336-878-6098   °[facility] [address], [place], [place] 336-832-9800   °[facility] [address], [place], [place] ° 336-882-2125   °[facility] [address],  °[place], [place] 1-800-853-5163 or 336-641-4981   °Substance Abuse Resources °Organization         Address  Phone  Notes  °[facility]  336-882-2125   °[facility]  336-784-9470   °[facility]  336-285-9073   °[facility]  336-845-3988   °Residential & Outpatient Substance Abuse Program  1-800-659-3381   °Psychological Services °Organization         Address  Phone  Notes  °[facility]  336- 832-9600   °[facility]  336- 378-7881   °[facility] [address], [place] 1-800-853-5163 or 336-641-4981   ° °Mobile Crisis Teams °Organization         Address  Phone  Notes  °[facility], Mobile Crisis Care Unit  1-877-626-1772   °Assertive °[facility] ° [address]  [place], [place] 336-834-9664   °Sharon DeEsch [address] °[place] 336-554-5454   ° °Self-Help/Support Groups °Organization         Address  Phone             Notes  °[facility]  - variety of support groups  336- 373-1402 Call for more information  °Narcotics Anonymous (NA), [facility] [address], °[place]  2 meetings at this location  ° °  Residential Treatment Programs °Organization         Address  Phone  Notes  °[facility] [address],    °[place]  1-866-801-8205   °[facility] ° [address], [place], [place] 704-293-8524   °[facility] [address], [place] 336-845-3988 Admissions: 8am-3pm M-F  °[facility] [address]    °[place], [place] 336-841-1104   °[facility] [address], [place], [place] 336-379-7146   °[facility] [address]  °[place], [place] 336-285-9073   °[facility] - Intensive Outpatient [address], [place], [place] 336-852-3033   °[facility] [address]  °[place], [place] 1-877-615-2722 or 336-784-9470   °[facility] [address] [place], [place] 336-227-7417 Accepts Medicaid  °[facility] [address]  °[place] 1-800-659-3381 Substance Abuse/Addiction Treatment  ° °[place] Behavioral Health Resources °Organization         Address  Phone  Notes  °[facility]  (888) 581-9988   °Julie Brannon, PhD [address] [place], [place]   (336) 349-5553 or (336) 951-0000   °[facility]   [address] °[place], [place] (336) 349-4454   °[facility] [address], [place], [place] (336) 342-8316 Insurance/Medicaid/sponsorship through [facility]  °[facility] [address]                                    [place], [place] (336) 342-8316 Therapy/tele-psych/case    °[facility] [address]  ° [place], [place] (336) 349-2233    °Dr. Arfeen  (336) 349-4544   °[facility] [place]  [employer] [place] [facility] 1) [address], [place] °2) [address], [place] °3)  [address], [place] (336) 349-3220 °(336) 342-7768 ° °(336) 342-8140   °[place] [facility] (336) 342-1394 or (336) 342-3537 (After Hours)    ° ° °

## 2015-03-04 ENCOUNTER — Emergency Department (HOSPITAL_COMMUNITY)
Admission: EM | Admit: 2015-03-04 | Discharge: 2015-03-04 | Disposition: A | Discharge status: Other Acute Inpatient Hospital | Payer: Self-pay | Attending: Emergency Medicine | Admitting: Emergency Medicine

## 2015-03-04 ENCOUNTER — Encounter (HOSPITAL_COMMUNITY): Payer: Self-pay | Admitting: Emergency Medicine

## 2015-03-04 ENCOUNTER — Inpatient Hospital Stay (HOSPITAL_COMMUNITY)
Admission: AD | Admit: 2015-03-04 | Discharge: 2015-03-07 | DRG: 885 | Disposition: A | Discharge status: Home / Self Care | Payer: No Typology Code available for payment source | Source: Intra-hospital | Attending: Psychiatry | Admitting: Psychiatry

## 2015-03-04 ENCOUNTER — Encounter (HOSPITAL_COMMUNITY): Payer: Self-pay | Admitting: *Deleted

## 2015-03-04 DIAGNOSIS — R45850 Homicidal ideations: Secondary | ICD-10-CM

## 2015-03-04 DIAGNOSIS — F419 Anxiety disorder, unspecified: Secondary | ICD-10-CM | POA: Diagnosis present

## 2015-03-04 DIAGNOSIS — F17210 Nicotine dependence, cigarettes, uncomplicated: Secondary | ICD-10-CM | POA: Diagnosis present

## 2015-03-04 DIAGNOSIS — F1010 Alcohol abuse, uncomplicated: Secondary | ICD-10-CM | POA: Clinically undetermined

## 2015-03-04 DIAGNOSIS — F1310 Sedative, hypnotic or anxiolytic abuse, uncomplicated: Secondary | ICD-10-CM | POA: Insufficient documentation

## 2015-03-04 DIAGNOSIS — F1210 Cannabis abuse, uncomplicated: Secondary | ICD-10-CM | POA: Insufficient documentation

## 2015-03-04 DIAGNOSIS — F19929 Other psychoactive substance use, unspecified with intoxication, unspecified: Secondary | ICD-10-CM | POA: Diagnosis present

## 2015-03-04 DIAGNOSIS — F19951 Other psychoactive substance use, unspecified with psychoactive substance-induced psychotic disorder with hallucinations: Secondary | ICD-10-CM

## 2015-03-04 DIAGNOSIS — F1110 Opioid abuse, uncomplicated: Secondary | ICD-10-CM | POA: Insufficient documentation

## 2015-03-04 DIAGNOSIS — G4700 Insomnia, unspecified: Secondary | ICD-10-CM | POA: Diagnosis present

## 2015-03-04 DIAGNOSIS — F1120 Opioid dependence, uncomplicated: Secondary | ICD-10-CM | POA: Diagnosis present

## 2015-03-04 DIAGNOSIS — R440 Auditory hallucinations: Secondary | ICD-10-CM | POA: Insufficient documentation

## 2015-03-04 DIAGNOSIS — F19251 Other psychoactive substance dependence with psychoactive substance-induced psychotic disorder with hallucinations: Secondary | ICD-10-CM | POA: Diagnosis not present

## 2015-03-04 DIAGNOSIS — F329 Major depressive disorder, single episode, unspecified: Secondary | ICD-10-CM | POA: Insufficient documentation

## 2015-03-04 DIAGNOSIS — F32A Depression, unspecified: Secondary | ICD-10-CM

## 2015-03-04 DIAGNOSIS — F1320 Sedative, hypnotic or anxiolytic dependence, uncomplicated: Secondary | ICD-10-CM | POA: Clinically undetermined

## 2015-03-04 DIAGNOSIS — F321 Major depressive disorder, single episode, moderate: Secondary | ICD-10-CM | POA: Diagnosis present

## 2015-03-04 DIAGNOSIS — F17200 Nicotine dependence, unspecified, uncomplicated: Secondary | ICD-10-CM | POA: Clinically undetermined

## 2015-03-04 DIAGNOSIS — F1910 Other psychoactive substance abuse, uncomplicated: Secondary | ICD-10-CM

## 2015-03-04 HISTORY — DX: Other psychoactive substance abuse, uncomplicated: F19.10

## 2015-03-04 LAB — COMPREHENSIVE METABOLIC PANEL
ALK PHOS: 49 U/L (ref 38–126)
ALT: 15 U/L — AB (ref 17–63)
ANION GAP: 8 (ref 5–15)
AST: 15 U/L (ref 15–41)
Albumin: 4.9 g/dL (ref 3.5–5.0)
BUN: 19 mg/dL (ref 6–20)
CO2: 30 mmol/L (ref 22–32)
CREATININE: 0.95 mg/dL (ref 0.61–1.24)
Calcium: 9.8 mg/dL (ref 8.9–10.3)
Chloride: 98 mmol/L — ABNORMAL LOW (ref 101–111)
GFR calc Af Amer: >60 mL/min (ref >60)
Glucose, Bld: 99 mg/dL (ref 65–99)
Potassium: 4.4 mmol/L (ref 3.5–5.1)
SODIUM: 136 mmol/L (ref 135–145)
Total Bilirubin: 1.4 mg/dL — ABNORMAL HIGH (ref 0.3–1.2)
Total Protein: 8.1 g/dL (ref 6.5–8.1)

## 2015-03-04 LAB — CBC WITH DIFFERENTIAL/PLATELET
Basophils Absolute: 0 10*3/uL (ref 0.0–0.1)
Basophils Relative: 0 %
EOS ABS: 0.2 10*3/uL (ref 0.0–0.7)
Eosinophils Relative: 2 %
HCT: 46.3 % (ref 39.0–52.0)
Hemoglobin: 16 g/dL (ref 13.0–17.0)
LYMPHS PCT: 17 %
Lymphs Abs: 2 10*3/uL (ref 0.7–4.0)
MCH: 31.2 pg (ref 26.0–34.0)
MCHC: 34.6 g/dL (ref 30.0–36.0)
MCV: 90.3 fL (ref 78.0–100.0)
MONO ABS: 0.9 10*3/uL (ref 0.1–1.0)
Monocytes Relative: 8 %
Neutro Abs: 8.6 10*3/uL — ABNORMAL HIGH (ref 1.7–7.7)
Neutrophils Relative %: 73 %
PLATELETS: 239 10*3/uL (ref 150–400)
RBC: 5.13 MIL/uL (ref 4.22–5.81)
RDW: 12.9 % (ref 11.5–15.5)
WBC: 11.8 10*3/uL — ABNORMAL HIGH (ref 4.0–10.5)

## 2015-03-04 LAB — ETHANOL: Alcohol, Ethyl (B): <5 mg/dL (ref <5)

## 2015-03-04 LAB — RAPID URINE DRUG SCREEN, HOSP PERFORMED
Amphetamines: POSITIVE — AB
BENZODIAZEPINES: POSITIVE — AB
Barbiturates: NOT DETECTED
COCAINE: POSITIVE — AB
Opiates: NOT DETECTED
Tetrahydrocannabinol: NOT DETECTED

## 2015-03-04 LAB — PROTIME-INR
INR: 1.12 (ref 0.00–1.49)
PROTHROMBIN TIME: 14.6 s (ref 11.6–15.2)

## 2015-03-04 LAB — SALICYLATE LEVEL: Salicylate Lvl: <4 mg/dL (ref 2.8–30.0)

## 2015-03-04 LAB — ACETAMINOPHEN LEVEL: Acetaminophen (Tylenol), Serum: <10 ug/mL — ABNORMAL LOW (ref 10–30)

## 2015-03-04 MED ORDER — LORAZEPAM 1 MG PO TABS
1.0000 mg | ORAL_TABLET | Freq: Four times a day (QID) | ORAL | Status: DC | PRN
  Administered 2015-03-04 – 2015-03-05 (×2): 1 mg via ORAL
  Filled 2015-03-04 (×2): qty 1

## 2015-03-04 MED ORDER — TRAZODONE HCL 50 MG PO TABS
50.0000 mg | ORAL_TABLET | Freq: Every evening | ORAL | Status: DC | PRN
  Administered 2015-03-04: 50 mg via ORAL
  Filled 2015-03-04: qty 1

## 2015-03-04 MED ORDER — MAGNESIUM HYDROXIDE 400 MG/5ML PO SUSP: 30.0000 mL | Freq: Every day | ORAL | Status: DC | PRN

## 2015-03-04 MED ORDER — NICOTINE 21 MG/24HR TD PT24
21.0000 mg | MEDICATED_PATCH | Freq: Every day | TRANSDERMAL | Status: DC
  Administered 2015-03-05 – 2015-03-07 (×3): 21 mg via TRANSDERMAL
  Filled 2015-03-04 (×6): qty 1

## 2015-03-04 MED ORDER — ALUM & MAG HYDROXIDE-SIMETH 200-200-20 MG/5ML PO SUSP: 30.0000 mL | ORAL | Status: DC | PRN

## 2015-03-04 NOTE — ED Notes (Signed)
Per Dr Clarene Duke, no sitter, does not need to dress in paper scrubs at this time.

## 2015-03-04 NOTE — ED Notes (Signed)
Patient left ED at this time with [facility] for transport to [facility].
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
## 2015-03-04 NOTE — Tx Team (Signed)
Initial Interdisciplinary Treatment Plan   PATIENT STRESSORS: Financial difficulties Marital or family conflict Substance abuse   PATIENT STRENGTHS: Active sense of humor Motivation for treatment/growth   PROBLEM LIST: Problem List/Patient Goals Date to be addressed Date deferred Reason deferred Estimated date of resolution  Substance Abuse 03/04/2015     Auditory Hallucinations 03/04/2015                                                DISCHARGE CRITERIA:  Improved stabilization in mood, thinking, and/or behavior  PRELIMINARY DISCHARGE PLAN: Outpatient therapy  PATIENT/FAMIILY INVOLVEMENT: This treatment plan has been presented to and reviewed with the patient, Spiros Greenfeld, and/or family member, .  The patient and family have been given the opportunity to ask questions and make suggestions.  Shelia Media 03/04/2015, 6:14 PM
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
## 2015-03-04 NOTE — ED Notes (Signed)
Pt sent from crisis prevention reports addiction to pain pills, xanax, and marijuana.  Pt last use of pain medication was 3 oxycodone 10's, took 3  xanax and used marijuana 17 days ago.  Pt has no thoughts of suicide but does have homicidal ideation.  Pt not able to provide specifics as to who he would want to hurt.  Pt has also had visual hallucinations, denies command hallucinations.  Pt depressed.  Pt alert and oriented.

## 2015-03-04 NOTE — ED Provider Notes (Signed)
CSN: [id number]     Arrival date & time 03/04/15  1238 History   First MD Initiated Contact with Patient 03/04/15 1312     Chief Complaint  Patient presents with  . Homicidal      HPI  Pt was seen at 1320. Per pt, c/o gradual onset and worsening of persistent depression for the past several weeks. Pt states he abuses opiates, benzos, and marijuana. Endorses occasional auditory hallucinations of his "family arguing." Pt endorses HI, but will not reveal to whom. States there are "guns in the house" that he can access. Pt called [facility] P[facility], then was sent to the ED for further evaluation. Pt also endorses hx of polysubstance abuse and states "I need to get clean." Denies SI, no SA.   Past Medical History  Diagnosis Date  . Polysubstance abuse     opiates, benzos, marijuana   History reviewed. No pertinent past surgical history.  Social History  Substance Use Topics  . Smoking status: Current Every Day Smoker -- 1.00 packs/day    Types: Cigarettes  . Smokeless tobacco: None  . Alcohol Use: No    Review of Systems ROS: Statement: All systems negative except as marked or noted in the HPI; Constitutional: Negative for fever and chills. ; ; Eyes: Negative for eye pain, redness and discharge. ; ; ENMT: Negative for ear pain, hoarseness, nasal congestion, sinus pressure and sore throat. ; ; Cardiovascular: Negative for chest pain, palpitations, diaphoresis, dyspnea and peripheral edema. ; ; Respiratory: Negative for cough, wheezing and stridor. ; ; Gastrointestinal: Negative for nausea, vomiting, diarrhea, abdominal pain, blood in stool, hematemesis, jaundice and rectal bleeding. . ; ; Genitourinary: Negative for dysuria, flank pain and hematuria. ; ; Musculoskeletal: Negative for back pain and neck pain. Negative for swelling and trauma.; ; Skin: Negative for pruritus, rash, abrasions, blisters, bruising and skin lesion.; ; Neuro: Negative for headache, lightheadedness and neck  stiffness. Negative for weakness, altered level of consciousness , altered mental status, extremity weakness, paresthesias, involuntary movement, seizure and syncope.; Psych:  No SI, no SA. +HI, +auditory hallucinations.     Allergies  Norco  Home Medications   Prior to Admission medications   Not on File   BP 130/79 mmHg  Pulse 102  Temp(Src) 97.9 F (36.6 C) (Oral)  Resp 18  Ht  (1.676 m)  Wt 154 lb (69.854 kg)  BMI 24.87 kg/m2  SpO2 99% Physical Exam  1325: Physical examination:  Nursing notes reviewed; Vital signs and O2 SAT reviewed;  Constitutional: Well developed, Well nourished, Well hydrated, In no acute distress; Head:  Normocephalic, atraumatic; Eyes: EOMI, PERRL, No scleral icterus; ENMT: Mouth and pharynx normal, Mucous membranes moist; Neck: Supple, Full range of motion; Cardiovascular: Regular rate and rhythm; Respiratory: Breath sounds clear, No wheezes.  Speaking full sentences with ease, Normal respiratory effort/excursion; Chest: No deformity, Movement normal; Abdomen: Nondistended; Extremities: No deformity.; Neuro: AA&Ox3, Major CN grossly intact.  Speech clear. No gross focal motor deficits in extremities. Climbs on and off stretcher easily by himself. Gait steady.; Skin: Color normal, Warm, Dry.; Psych:  Anxious. Does not appear to be responding to internal stimuli.      ED Course  Procedures (including critical care time) Labs Review  Imaging Review  I have personally reviewed and evaluated these images and lab results as part of my medical decision-making.   EKG Interpretation None      MDM  MDM Reviewed: previous chart, nursing note and vitals Reviewed previous:  labs Interpretation: labs   Results for orders placed or performed during the hospital encounter of 03/04/15  Acetaminophen level  Result Value Ref Range   Acetaminophen (Tylenol), Serum <10 (L) 10 - 30 ug/mL  Comprehensive metabolic panel  Result Value Ref Range   Sodium 136  135 - 145 mmol/L   Potassium 4.4 3.5 - 5.1 mmol/L   Chloride 98 (L) 101 - 111 mmol/L   CO2 30 22 - 32 mmol/L   Glucose, Bld 99 65 - 99 mg/dL   BUN 19 6 - 20 mg/dL   Creatinine, Ser 1.61 0.61 - 1.24 mg/dL   Calcium 9.8 8.9 - 09.6 mg/dL   Total Protein 8.1 6.5 - 8.1 g/dL   Albumin 4.9 3.5 - 5.0 g/dL   AST 15 15 - 41 U/L   ALT 15 (L) 17 - 63 U/L   Alkaline Phosphatase 49 38 - 126 U/L   Total Bilirubin 1.4 (H) 0.3 - 1.2 mg/dL   GFR calc non Af Amer >60 >60 mL/min   GFR calc Af Amer >60 >60 mL/min   Anion gap 8 5 - 15  Ethanol  Result Value Ref Range   Alcohol, Ethyl (B) <5 <5 mg/dL  Salicylate level  Result Value Ref Range   Salicylate Lvl <4.0 2.8 - 30.0 mg/dL  CBC with Differential  Result Value Ref Range   WBC 11.8 (H) 4.0 - 10.5 K/uL   RBC 5.13 4.22 - 5.81 MIL/uL   Hemoglobin 16.0 13.0 - 17.0 g/dL   HCT 04.5 40.9 - 81.1 %   MCV 90.3 78.0 - 100.0 fL   MCH 31.2 26.0 - 34.0 pg   MCHC 34.6 30.0 - 36.0 g/dL   RDW 91.4 78.2 - 95.6 %   Platelets 239 150 - 400 K/uL   Neutrophils Relative % 73 %   Neutro Abs 8.6 (H) 1.7 - 7.7 K/uL   Lymphocytes Relative 17 %   Lymphs Abs 2.0 0.7 - 4.0 K/uL   Monocytes Relative 8 %   Monocytes Absolute 0.9 0.1 - 1.0 K/uL   Eosinophils Relative 2 %   Eosinophils Absolute 0.2 0.0 - 0.7 K/uL   Basophils Relative 0 %   Basophils Absolute 0.0 0.0 - 0.1 K/uL  Protime-INR  Result Value Ref Range   Prothrombin Time 14.6 11.6 - 15.2 seconds   INR 1.12 0.00 - 1.49    1450:  TTS has evaluated pt: placement pending.  1500:  [facility] has accepted pt; will transfer stable.         Samuel Jester, DO 03/06/15 1302

## 2015-03-04 NOTE — ED Notes (Signed)
Pt states he has no plans to hurt himself but states he has a temper and when he gets mad he wants to hurt others that have made him mad. States he also, wants to get off drugs

## 2015-03-04 NOTE — ED Notes (Signed)
Pt wanded by security. 

## 2015-03-04 NOTE — ED Notes (Signed)
Given sprite per request 

## 2015-03-04 NOTE — ED Notes (Signed)
Called [facility] for transport to [facility]. 

## 2015-03-04 NOTE — BH Assessment (Addendum)
[facility] Assessment Progress Note   Pt accepted to [facility] bed 307-2 per Inetta Fermo, [facility]. Fransisca Kaufmann, NP accepted to Dr. Dub Mikes. Please call report to [phone] and fax voluntary paperwork.

## 2015-03-04 NOTE — Progress Notes (Signed)
Adult Psychoeducational Group Note  Date:  03/04/2015 Time:  9:19 PM  Group Topic/Focus:  Wrap-Up Group:   The focus of this group is to help patients review their daily goal of treatment and discuss progress on daily workbooks.  Participation Level:  Active  Participation Quality:  Appropriate and Attentive  Affect:  Appropriate  Cognitive:  Appropriate  Insight: Appropriate and Good  Engagement in Group:  Engaged  Modes of Intervention:  Discussion  Additional Comments:  Pt rated his day a 6 out of 10. Pt goal is to try and find things to occupy his time.   Merlinda Frederick 03/04/2015, 9:19 PM

## 2015-03-04 NOTE — Progress Notes (Signed)
D:Patient in the hallway on approach.  Patient states he is here because he is having family problems.  Patient states his sister makes him angry.  Patient does admits to having a polysub problem.  Patient denies SI but states he would have HI if someone tried to harm him.  Patient states he has auditory hallucinations sometimes. A: Staff to monitor Q 15 mins for safety.  Encouragement and support offered.  No scheduled medications administered per orders.  Ativan administered prn for anxiety.  Trazodone administered prn for sleep.   R: Patient remains safe on the unit.  Patient attended group tonight.  Patient visible on the unit.  Patient taking administered medications.

## 2015-03-05 ENCOUNTER — Encounter (HOSPITAL_COMMUNITY): Payer: Self-pay | Admitting: Psychiatry

## 2015-03-05 DIAGNOSIS — F321 Major depressive disorder, single episode, moderate: Secondary | ICD-10-CM | POA: Clinically undetermined

## 2015-03-05 DIAGNOSIS — F1010 Alcohol abuse, uncomplicated: Secondary | ICD-10-CM | POA: Clinically undetermined

## 2015-03-05 DIAGNOSIS — F17200 Nicotine dependence, unspecified, uncomplicated: Secondary | ICD-10-CM | POA: Clinically undetermined

## 2015-03-05 DIAGNOSIS — F1320 Sedative, hypnotic or anxiolytic dependence, uncomplicated: Secondary | ICD-10-CM | POA: Clinically undetermined

## 2015-03-05 DIAGNOSIS — F19251 Other psychoactive substance dependence with psychoactive substance-induced psychotic disorder with hallucinations: Secondary | ICD-10-CM

## 2015-03-05 DIAGNOSIS — F1120 Opioid dependence, uncomplicated: Secondary | ICD-10-CM | POA: Clinically undetermined

## 2015-03-05 MED ORDER — METHOCARBAMOL 500 MG PO TABS: 500.0000 mg | ORAL_TABLET | Freq: Three times a day (TID) | ORAL | Status: DC | PRN

## 2015-03-05 MED ORDER — CITALOPRAM HYDROBROMIDE 10 MG PO TABS
10.0000 mg | ORAL_TABLET | Freq: Every day | ORAL | Status: DC
  Administered 2015-03-05 – 2015-03-07 (×3): 10 mg via ORAL
  Filled 2015-03-05: qty 7
  Filled 2015-03-05 (×5): qty 1

## 2015-03-05 MED ORDER — CLONIDINE HCL 0.1 MG PO TABS
0.1000 mg | ORAL_TABLET | ORAL | Status: DC
  Filled 2015-03-05 (×4): qty 1

## 2015-03-05 MED ORDER — CHLORDIAZEPOXIDE HCL 25 MG PO CAPS
25.0000 mg | ORAL_CAPSULE | Freq: Three times a day (TID) | ORAL | Status: AC
  Administered 2015-03-06 (×3): 25 mg via ORAL
  Filled 2015-03-05 (×2): qty 1

## 2015-03-05 MED ORDER — CHLORDIAZEPOXIDE HCL 25 MG PO CAPS: 25.0000 mg | ORAL_CAPSULE | Freq: Every day | ORAL | Status: DC

## 2015-03-05 MED ORDER — CHLORDIAZEPOXIDE HCL 25 MG PO CAPS
25.0000 mg | ORAL_CAPSULE | Freq: Four times a day (QID) | ORAL | Status: AC
  Administered 2015-03-05 (×2): 25 mg via ORAL
  Filled 2015-03-05 (×3): qty 1

## 2015-03-05 MED ORDER — CLONIDINE HCL 0.1 MG PO TABS
0.1000 mg | ORAL_TABLET | Freq: Every day | ORAL | Status: DC
Start: 2015-03-09 — End: 2015-03-07
  Filled 2015-03-05: qty 1

## 2015-03-05 MED ORDER — CLONIDINE HCL 0.1 MG PO TABS
0.1000 mg | ORAL_TABLET | Freq: Four times a day (QID) | ORAL | Status: AC
  Administered 2015-03-05 – 2015-03-06 (×3): 0.1 mg via ORAL
  Filled 2015-03-05 (×8): qty 1

## 2015-03-05 MED ORDER — THIAMINE HCL 100 MG/ML IJ SOLN
100.0000 mg | Freq: Once | INTRAMUSCULAR | Status: AC
Start: 2015-03-05 — End: 2015-03-05
  Administered 2015-03-05: 100 mg via INTRAMUSCULAR
  Filled 2015-03-05: qty 2

## 2015-03-05 MED ORDER — CHLORDIAZEPOXIDE HCL 25 MG PO CAPS
25.0000 mg | ORAL_CAPSULE | Freq: Four times a day (QID) | ORAL | Status: DC | PRN
  Administered 2015-03-06: 25 mg via ORAL
  Filled 2015-03-05: qty 1

## 2015-03-05 MED ORDER — HYDROXYZINE HCL 25 MG PO TABS: 25.0000 mg | ORAL_TABLET | Freq: Four times a day (QID) | ORAL | Status: DC | PRN

## 2015-03-05 MED ORDER — ADULT MULTIVITAMIN W/MINERALS CH
1.0000 | ORAL_TABLET | Freq: Every day | ORAL | Status: DC
  Administered 2015-03-05 – 2015-03-07 (×3): 1 via ORAL
  Filled 2015-03-05 (×6): qty 1

## 2015-03-05 MED ORDER — VITAMIN B-1 100 MG PO TABS
100.0000 mg | ORAL_TABLET | Freq: Every day | ORAL | Status: DC
  Administered 2015-03-06 – 2015-03-07 (×2): 100 mg via ORAL
  Filled 2015-03-05 (×4): qty 1

## 2015-03-05 MED ORDER — CHLORDIAZEPOXIDE HCL 25 MG PO CAPS
25.0000 mg | ORAL_CAPSULE | ORAL | Status: DC
  Administered 2015-03-07: 25 mg via ORAL
  Filled 2015-03-05: qty 1

## 2015-03-05 MED ORDER — LOPERAMIDE HCL 2 MG PO CAPS: 2.0000 mg | ORAL_CAPSULE | ORAL | Status: DC | PRN

## 2015-03-05 MED ORDER — DICYCLOMINE HCL 20 MG PO TABS: 20.0000 mg | ORAL_TABLET | Freq: Four times a day (QID) | ORAL | Status: DC | PRN

## 2015-03-05 MED ORDER — NAPROXEN 500 MG PO TABS
500.0000 mg | ORAL_TABLET | Freq: Two times a day (BID) | ORAL | Status: DC | PRN
  Administered 2015-03-06: 500 mg via ORAL
  Filled 2015-03-05: qty 1

## 2015-03-05 MED ORDER — ONDANSETRON 4 MG PO TBDP: 4.0000 mg | ORAL_TABLET | Freq: Four times a day (QID) | ORAL | Status: DC | PRN

## 2015-03-05 MED ORDER — TRAZODONE HCL 50 MG PO TABS
50.0000 mg | ORAL_TABLET | Freq: Every day | ORAL | Status: DC
  Administered 2015-03-05 – 2015-03-06 (×2): 50 mg via ORAL
  Filled 2015-03-05 (×2): qty 1
  Filled 2015-03-05: qty 7
  Filled 2015-03-05 (×3): qty 1

## 2015-03-05 NOTE — H&P (Signed)
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 Admitted to [facility] from the [facility] with complaints of polysubstance use disorder, depressed mood & the thought of violence towards others. Ritesh reports that this is his first inpatient psychiatric admission. During this admission assessment, he reports, "My family took me to the [facility] yesterday. I have been having a lot of stuff happening in my life. My parents are having problems, then they split up. I have been depressed since. I have been using pills that I buy off the the streets. I have been using x 4 years. I use just to get away from my reality. I also use to get high. This is not who I'm. I was not like this in the past. Since started using drugs, I don't like to be around people. I don't talk much any more. I drink occasional alcohol (bud light), may be 2-3 cans at a time. Depressed for 4 years. I have not been treated for depression. I have not been to any substance abuse treatment program. I have not been sober. I'm not suicidal or homicidal. I have occasional thought of violence, but I will walk away from violence before I will get involved in one. I have been using the Roxys & the hydrocodones. I had used some Subutex about 8 months ago. I did not do pain pills for 2 weeks. Is there any way that I can be put on Subutex for my opioid addiction". Raeqwon will be receiving detox treatments for Benzodiazepine & opioids. He will be started on an antidepressant.    Associated Signs/Symptoms:  Depression Symptoms:  depressed mood, insomnia, feelings of worthlessness/guilt, anxiety, loss of energy/fatigue,  (Hypo) Manic Symptoms:  Denies any hypomanic symptoms  Anxiety Symptoms:  Excessive Worry,  Psychotic Symptoms:  Admits auditory hallucinations 3 days ago  PTSD Symptoms: Denies  Total Time spent with patient: 1 hour  Past Psychiatric History: Says depressed x 3 days  Risk to Self: Is patient at risk for suicide?: No What has been your use of drugs/alcohol within the last 12 months?: pain pills daily "for years now." xanax for years now. "buying off the street." occassional marijuana abuse. "I drink sometimes but hardly ever."   Risk to Others: No  Prior Inpatient Therapy: No  Prior Outpatient Therapy: No  Alcohol Screening: 1. How often do you have a drink containing alcohol?: 4 or more times a week 2. How many drinks containing alcohol do you have on a typical day when you are drinking?: 1 or 2 3. How often do you have six or more drinks on one occasion?: Never Preliminary Score: 0 9. Have you or someone else been injured as a result of your drinking?: No 10. Has a relative or friend or a doctor or another health worker been concerned about your drinking or suggested you cut down?: No Alcohol Use Disorder Identification Test Final Score (AUDIT): 4 Brief Intervention: AUDIT score less than 7 or less-screening does not suggest unhealthy drinking-brief intervention not indicated  Substance Abuse History in the last 12 months:  Yes.  Consequences of Substance Abuse: Medical Consequences:  Liver damage, Possible death by overdose Legal Consequences:  Arrests, jail time, Loss of driving privilege. Family Consequences:  Family discord, divorce and or separation.  Previous Psychotropic Medications: No   Psychological Evaluations: Yes   Past Medical History:  Past Medical History  Diagnosis Date  . Polysubstance abuse      opiates, benzos, marijuana   History reviewed. No pertinent past surgical history.  Family History: History reviewed. No pertinent family history.  Family Psychiatric  History: Denies any family hx of mental illness or sunbstance abuse  Social History:  History  Alcohol Use  . 3.0 oz/week  . 5 Cans of beer per week     History  Drug Use  . Yes  . Special: Marijuana    Comment: pain pills, opiates, benzos    Social History   Social History  . Marital Status: Single    Spouse Name: N/A  . Number of Children: N/A  . Years of Education: N/A   Social History Main Topics  . Smoking status: Current Every Day Smoker -- 2.00 packs/day    Types: Cigarettes  . Smokeless tobacco: None  . Alcohol Use: 3.0 oz/week    5 Cans of beer per week  . Drug Use: Yes    Special: Marijuana     Comment: pain pills, opiates, benzos  . Sexual Activity: Yes    Birth Control/ Protection: None   Other Topics Concern  . None   Social History Narrative   Additional Social History: History of alcohol / drug use?: Yes  Allergies:   Allergies  Allergen Reactions  . Norco [Hydrocodone-Acetaminophen] Nausea And Vomiting   Lab Results:  Results for orders placed or performed during the hospital encounter of 03/04/15 (from the past 48 hour(s))  Acetaminophen level     Status: Abnormal   Collection Time: 03/04/15  1:25 PM  Result Value Ref Range   Acetaminophen (Tylenol), Serum <10 (L) 10 - 30 ug/mL    Comment:        THERAPEUTIC CONCENTRATIONS VARY SIGNIFICANTLY. A RANGE OF 10-30 ug/mL MAY BE AN EFFECTIVE CONCENTRATION FOR MANY PATIENTS. HOWEVER, SOME ARE BEST TREATED AT CONCENTRATIONS OUTSIDE THIS RANGE. ACETAMINOPHEN CONCENTRATIONS >150 ug/mL AT 4 HOURS AFTER INGESTION AND >50 ug/mL AT 12 HOURS AFTER INGESTION ARE OFTEN ASSOCIATED WITH TOXIC REACTIONS.   Comprehensive metabolic panel     Status: Abnormal   Collection Time: 03/04/15  1:25 PM  Result Value Ref Range   Sodium 136  135 - 145 mmol/L   Potassium 4.4 3.5 - 5.1 mmol/L   Chloride 98 (L) 101 - 111 mmol/L   CO2 30 22 - 32 mmol/L   Glucose, Bld 99 65 - 99 mg/dL   BUN 19 6 - 20 mg/dL   Creatinine, Ser 0.95 0.61 - 1.24 mg/dL   Calcium 9.8 8.9 - 10.3 mg/dL   Total Protein 8.1 6.5 - 8.1 g/dL   Albumin 4.9 3.5 - 5.0 g/dL   AST 15 15 - 41 U/L   ALT 15 (L) 17 - 63 U/L   Alkaline Phosphatase 49 38 - 126 U/L   Total Bilirubin 1.4 (H) 0.3 - 1.2 mg/dL   GFR calc non Af Amer >60 >60 mL/min   GFR calc Af Amer >60 >60 mL/min    Comment: (NOTE) The eGFR has been calculated using the CKD EPI equation. This calculation has not been validated in all clinical situations. eGFR's persistently <60 mL/min signify possible Chronic Kidney  Disease.    Anion gap 8 5 - 15  Ethanol     Status: None   Collection Time: 03/04/15  1:25 PM  Result Value Ref Range   Alcohol, Ethyl (B) <5 <5 mg/dL    Comment:        LOWEST DETECTABLE LIMIT FOR SERUM ALCOHOL IS 5 mg/dL FOR MEDICAL PURPOSES ONLY   Salicylate level     Status: None   Collection Time: 03/04/15  1:25 PM  Result Value Ref Range   Salicylate Lvl <2.4 2.8 - 30.0 mg/dL  CBC with Differential     Status: Abnormal   Collection Time: 03/04/15  1:25 PM  Result Value Ref Range   WBC 11.8 (H) 4.0 - 10.5 K/uL   RBC 5.13 4.22 - 5.81 MIL/uL   Hemoglobin 16.0 13.0 - 17.0 g/dL   HCT 46.3 39.0 - 52.0 %   MCV 90.3 78.0 - 100.0 fL   MCH 31.2 26.0 - 34.0 pg   MCHC 34.6 30.0 - 36.0 g/dL   RDW 12.9 11.5 - 15.5 %   Platelets 239 150 - 400 K/uL   Neutrophils Relative % 73 %   Neutro Abs 8.6 (H) 1.7 - 7.7 K/uL   Lymphocytes Relative 17 %   Lymphs Abs 2.0 0.7 - 4.0 K/uL   Monocytes Relative 8 %   Monocytes Absolute 0.9 0.1 - 1.0 K/uL   Eosinophils Relative 2 %   Eosinophils Absolute 0.2 0.0 - 0.7 K/uL   Basophils Relative 0 %   Basophils Absolute 0.0 0.0 - 0.1 K/uL  Protime-INR     Status: None   Collection Time: 03/04/15  1:25 PM  Result Value Ref Range   Prothrombin Time  14.6 11.6 - 15.2 seconds   INR 1.12 0.00 - 1.49  Urine rapid drug screen (hosp performed)     Status: Abnormal   Collection Time: 03/04/15  2:30 PM  Result Value Ref Range   Opiates NONE DETECTED NONE DETECTED   Cocaine POSITIVE (A) NONE DETECTED   Benzodiazepines POSITIVE (A) NONE DETECTED   Amphetamines POSITIVE (A) NONE DETECTED   Tetrahydrocannabinol NONE DETECTED NONE DETECTED   Barbiturates NONE DETECTED NONE DETECTED    Comment:        DRUG SCREEN FOR MEDICAL PURPOSES ONLY.  IF CONFIRMATION IS NEEDED FOR ANY PURPOSE, NOTIFY LAB WITHIN 5 DAYS.        LOWEST DETECTABLE LIMITS FOR URINE DRUG SCREEN Drug Class       Cutoff (ng/mL) Amphetamine      1000 Barbiturate      200 Benzodiazepine   401 Tricyclics       027 Opiates          300 Cocaine          300 THC              50    Metabolic Disorder Labs:  No results found for: HGBA1C, MPG No results found for: PROLACTIN No results found for: CHOL, TRIG, HDL, CHOLHDL, VLDL, LDLCALC  Current Medications: Current Facility-Administered Medications  Medication Dose Route Frequency Provider Last Rate Last Dose  . alum & mag hydroxide-simeth (MAALOX/MYLANTA) 200-200-20 MG/5ML suspension 30 mL  30 mL Oral Q4H PRN Niel Hummer, NP      . chlordiazePOXIDE (LIBRIUM) capsule 25 mg  25 mg Oral Q6H PRN Encarnacion Slates, NP      . chlordiazePOXIDE (LIBRIUM) capsule 25 mg  25 mg Oral QID Encarnacion Slates, NP  Followed by  . [START ON 03/06/2015] chlordiazePOXIDE (LIBRIUM) capsule 25 mg  25 mg Oral TID Encarnacion Slates, NP       Followed by  . [START ON 03/07/2015] chlordiazePOXIDE (LIBRIUM) capsule 25 mg  25 mg Oral BH-qamhs Encarnacion Slates, NP       Followed by  . [START ON 03/09/2015] chlordiazePOXIDE (LIBRIUM) capsule 25 mg  25 mg Oral Daily Encarnacion Slates, NP      . hydrOXYzine (ATARAX/VISTARIL) tablet 25 mg  25 mg Oral Q6H PRN Encarnacion Slates, NP      . loperamide (IMODIUM) capsule 2-4 mg  2-4 mg Oral PRN Encarnacion Slates, NP      . magnesium  hydroxide (MILK OF MAGNESIA) suspension 30 mL  30 mL Oral Daily PRN Niel Hummer, NP      . multivitamin with minerals tablet 1 tablet  1 tablet Oral Daily Encarnacion Slates, NP      . nicotine (NICODERM CQ - dosed in mg/24 hours) patch 21 mg  21 mg Transdermal Daily Nicholaus Bloom, MD   21 mg at 03/05/15 1194  . ondansetron (ZOFRAN-ODT) disintegrating tablet 4 mg  4 mg Oral Q6H PRN Encarnacion Slates, NP      . thiamine (B-1) injection 100 mg  100 mg Intramuscular Once Encarnacion Slates, NP      . Derrill Memo ON 03/06/2015] thiamine (VITAMIN B-1) tablet 100 mg  100 mg Oral Daily Encarnacion Slates, NP      . traZODone (DESYREL) tablet 50 mg  50 mg Oral QHS Encarnacion Slates, NP       PTA Medications: No prescriptions prior to admission   Musculoskeletal: Strength & Muscle Tone: within normal limits Gait & Station: normal Patient leans: N/A  Psychiatric Specialty Exam: Physical Exam  Constitutional: He is oriented to person, place, and time. He appears well-developed and well-nourished.  HENT:  Head: Normocephalic.  Eyes: Pupils are equal, round, and reactive to light.  Neck: Normal range of motion.  Cardiovascular: Normal rate.   Respiratory: Effort normal.  GI: Soft.  Genitourinary:  Denies any issues in this area  Musculoskeletal: Normal range of motion.  Neurological: He is alert and oriented to person, place, and time.  Skin: Skin is warm and dry.  Psychiatric: His speech is normal and behavior is normal. Thought content normal. His mood appears anxious. His affect is not angry, not blunt, not labile and not inappropriate. Cognition and memory are normal. He expresses impulsivity. He exhibits a depressed mood.    Review of Systems  Constitutional: Positive for chills, malaise/fatigue and diaphoresis.  Eyes: Negative.   Respiratory: Negative.   Cardiovascular: Negative.   Gastrointestinal: Positive for nausea and abdominal pain.  Genitourinary: Negative.   Musculoskeletal: Positive for myalgias.   Skin: Negative.   Neurological: Positive for dizziness, weakness and headaches.  Endo/Heme/Allergies: Negative.   Psychiatric/Behavioral: Positive for depression (Rates #7) and substance abuse (Opioid, Benzodiazepine dependence). Negative for suicidal ideas, hallucinations and memory loss. The patient is nervous/anxious and has insomnia.     Blood pressure 97/53, pulse 116, temperature 97.3 F (36.3 C), temperature source Oral, resp. rate 14, height 5' 6"  (1.676 m), weight 69.854 kg (154 lb), SpO2 99 %.Body mass index is 24.87 kg/(m^2).  General Appearance: Casual and Fairly Groomed  Eye Contact::  Good  Speech:  Clear and Coherent  Volume:  Normal  Mood:  Anxious and Depressed  Affect:  Blunt  Thought Process:  Coherent,  Goal Directed and Logical  Orientation:  Full (Time, Place, and Person)  Thought Content:  Admits auditory hallucinations 3 days ago  Suicidal Thoughts:  Denies  Homicidal Thoughts:  Denies  Memory:  Grossly intact  Judgement:  Fair  Insight:  Present  Psychomotor Activity:  Restlessness  Concentration:  Fair  Recall:  Good  Fund of Knowledge:Fair  Language: Good  Akathisia:  No  Handed:  Right  AIMS (if indicated):     Assets:  Communication Skills Desire for Improvement Physical Health  ADL's:  Fairly intact  Cognition: WNL  Sleep:  Number of Hours: 6.75   Treatment Plan/Recommendations: 1. Admit for crisis management and stabilization, estimated length of stay 3-5 days.  2. Medication management to reduce current symptoms to base line and improve the patient's overall level of functioning; Librium/Clonidine detoxification treatment protocols for opioid & Benzodiazepine dependence, Citalopram 10 mg for depression & Trazodone 50 mg for insomnia.   3. Treat health problems as indicated.  4. Develop treatment plan to decrease risk of relapse upon discharge and the need for readmission.  5. Psycho-social education regarding relapse prevention and self care.   6. Health care follow up as needed for medical problems.  7. Review, reconcile, and reinstate any pertinent home medications for other health issues where appropriate. 8. Call for consults with hospitalist for any additional specialty patient care services as needed.  Observation Level/Precautions:  15 minute checks  Laboratory:  PER ED, UDS + for Amphetamine, Benzodiazepine & cocaine  Psychotherapy: Group counseling sessions, AA/NA meetings  Medications: Librium/clonidine detox protocols, Citalopram 10 mg, Trazodone 50 mg   Consultations: As needed   Discharge Concerns: mood stability/maintaining sobriety   Estimated LOS: 2-4 days only  Other:  Admit for Benzodiazepine/opioid detox & depression treatments   I certify that inpatient services furnished can reasonably be expected to improve the patient's condition.   Encarnacion Slates, PMHNP, FNP-BC 1/31/201711:18 AM

## 2015-03-05 NOTE — Progress Notes (Signed)
Patient ID: Michael Costa, male   DOB: 17-Jun-1990, 25 y.o.   MRN: [id number]   Pt currently presents with a flat affect and anxious, needy behavior. Pt only writes "Not bad right now" when referring to his depression, hopelessness and anxiety. Pt's daily goal is to "my thoughts" and they intend to do so by "staying focused." Pt reports fair sleep, a fair appetite, normal energy and poor concentration. Pt frequently asks for medications. Pt states that he is feeling uncomfortable from withdrawal today, pt sits in dayroom and attends groups today. Pt seen laughing in the dayroom.  Pt provided with medications per providers orders. Pt's labs and vitals were monitored throughout the day. Pt supported emotionally and encouraged to express concerns and questions. Pt educated on medications and medication adherence.   Pt's safety ensured with 15 minute and environmental checks. Pt currently denies SI/HI and A/V hallucinations. Pt verbally agrees to seek staff if SI/HI or A/VH occurs and to consult with staff before acting on these thoughts. Will continue POC.

## 2015-03-05 NOTE — BHH Counselor (Signed)
Adult Comprehensive Assessment  Patient ID: Michael Costa, male   DOB: Jan 02, 1991, 25 y.o.   MRN: [id number]  Information Source: Information source: Patient  Current Stressors:  Educational / Learning stressors: 10th grade Employment / Job issues: unemployed due to drug use.  Physical health (include injuries & life threatening diseases): none identified  Bereavement / Loss: none identified.   Living/Environment/Situation:  Living Arrangements: Parent Living conditions (as described by patient or guardian): living with mom, grandma, sister, and neice.  How long has patient lived in current situation?: 6 months.  What is atmosphere in current home: Supportive  Family History:  Marital status: Single Are you sexually active?: No What is your sexual orientation?: heterosexual  Has your sexual activity been affected by drugs, alcohol, medication, or emotional stress?: no Does patient have children?: No  Childhood History:  By whom was/is the patient raised?: Mother/father and step-parent Additional childhood history information: when pt was 5, biological father died of heart attack. mom was drug addict until pt was 61mo old. grandmother helped raise him. Description of patient's relationship with caregiver when they were a child: close to mother and grandmother Patient's description of current relationship with people who raised him/her: strained with mother and grandmother. close to stepdad who is separated from mother.  How were you disciplined when you got in trouble as a child/adolescent?: n/a  Does patient have siblings?: Yes Number of Siblings: 1 Description of patient's current relationship with siblings: 2 year old sister and 68 year old neice. "I love her to death."  Did patient suffer any verbal/emotional/physical/sexual abuse as a child?: No Did patient suffer from severe childhood neglect?: No Has patient ever been sexually abused/assaulted/raped as an adolescent or adult?:  No Was the patient ever a victim of a crime or a disaster?: No Witnessed domestic violence?: No Has patient been effected by domestic violence as an adult?: No  Education:  Highest grade of school patient has completed: 10th grade; quit school. "I stayed at home about a year and then I got a job."  Currently a Consulting civil engineer?: No Learning disability?: No  Employment/Work Situation:   Employment situation: Unemployed Patient's job has been impacted by current illness: Yes Describe how patient's job has been impacted: "drugs got in the way."  What is the longest time patient has a held a job?: [employer] work Where was the patient employed at that time?: 6-8 months  Has patient ever been in the [employer]?: No Has patient ever served in combat?: No Did You Receive Any Psychiatric Treatment/Services While in Equities trader?: No Are There Guns or Other Weapons in Your Home?: Yes Types of Guns/Weapons: n/a  Are These Comptroller?:  (n/a )  Financial Resources:   Financial resources: Support from parents / caregiver, No income Does patient have a Lawyer or guardian?: No  Alcohol/Substance Abuse:   What has been your use of drugs/alcohol within the last 12 months?: pain pills daily "for years now." xanax for years now. "buying off the street." occassional marijuana abuse. "I drink sometimes but hardly ever."  If attempted suicide, did drugs/alcohol play a role in this?: No Alcohol/Substance Abuse Treatment Hx: Denies past history If yes, describe treatment: n/a  Has alcohol/substance abuse ever caused legal problems?: Yes (Feb 9 for driving with license revoked. )  Social Support System:   Patient's Community Support System: Good Describe Community Support System: good friends in community Type of faith/religion: christian How does patient's faith help to cope with current  illness?: "I don't go to church."   Leisure/Recreation:   Leisure and Hobbies:  riding 4 wheelers; playing basketball.   Strengths/Needs:   What things does the patient do well?: outdoors stuff In what areas does patient struggle / problems for patient: stress and anxiety; chronic pain pill and xanax abuse.   Discharge Plan:   Does patient have access to transportation?: Yes Will patient be returning to same living situation after discharge?: Yes Currently receiving community mental health services: No If no, would patient like referral for services when discharged?: Yes (What county[place] [facility] [place]) Does patient have financial barriers related to discharge medications?: No (no income; no insurance)  Summary/Recommendations:   Emergency planning/management officer and Recommendations (to be completed by the evaluator): Patient is 25 year old [place] [facility] [place] with his mother, sister, grandmother, and niece. Patient presents to the hospital seeking treatment for opiate abuse/benzo abuse, increased anxiety/depression, medication stabilization. Recommendations for patient include: crisis stabilization, therapeutic milieu, encourage group attendance and participation, medication management for mood/detox, and development of comprehensive mental wellness/sobriety plan.   Smart, Shirline Kendle LCSW 03/05/2015 10:23 AM

## 2015-03-05 NOTE — Progress Notes (Signed)
D: Pt has depressed affect and mood.  He reports his day was "pretty good" and that his goal is to "have a more positive attitude and work on me."  He reports he "might go to [place] in [place]."  Pt denies SI/HI, denies hallucinations, denies pain.  Pt has been visible in milieu interacting with peers and staff appropriately.  Pt attended evening group.  He reports withdrawal symptom of "dry mouth."   A: Introduced self to pt.  Met with pt 1:1 and provided support and encouragement.  PO fluids encouraged and pitcher of Gatorade provided.  Medications administered per order.  R: Pt is compliant with medications.  Pt verbally contracts for safety.  Will continue to monitor and assess.
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
## 2015-03-05 NOTE — Progress Notes (Signed)
Adult Psychoeducational Group Note  Date:  03/05/2015 Time:  9:21 PM  Group Topic/Focus:  Wrap-Up Group:   The focus of this group is to help patients review their daily goal of treatment and discuss progress on daily workbooks.  Participation Level:  Active  Participation Quality:  Appropriate  Affect:  Appropriate  Cognitive:  Alert  Insight: Appropriate  Engagement in Group:  Engaged  Modes of Intervention:  Discussion  Additional Comments:  Patient goal for today was to stay positive, and to maintain a positive attitude. Patient stated he was still achieving his goal. On a scale between 1-10 (1=worst, 10=best) patient rated his day as a 7.  Michael Costa 03/05/2015, 9:21 PM

## 2015-03-05 NOTE — [facility] Group Notes (Signed)
[facility] LCSW Group Therapy  03/05/2015 3:26 PM  Type of Therapy:  Group Therapy  Participation Level:  Active  Participation Quality:  Attentive  Affect:  Appropriate  Cognitive:  Alert and Oriented  Insight:  Improving  Engagement in Therapy:  Improving  Modes of Intervention:  Confrontation, Discussion, Education, Exploration, Problem-solving, Rapport Building, Socialization and Support  Summary of Progress/Problems: Feelings Around Diagnosis. Davarious was attentive and engaged during today's processing group. He shared that he is finally acknowledging that he has an anxiety problem and has been self medicating with benzos and pain pills for "years." "It's just a temporary escape but I don't know how to handle all this stress." Mj shared that he is open to learning more about his diagnosis and what coping skills will help him deal appropriately with his symptoms.   Smart, Takiyah Bohnsack LCSW 03/05/2015, 3:26 PM

## 2015-03-05 NOTE — Plan of Care (Signed)
Problem: Alteration in mood & ability to function due to Goal: STG-Patient will report withdrawal symptoms Outcome: Progressing Same nurse assigned to pt today as yesterday

## 2015-03-05 NOTE — Plan of Care (Signed)
Problem: Alteration in mood Goal: STG-Patient is able to discuss feelings and issues (Patient is able to discuss feelings and issues leading to depression)  Outcome: Not Progressing Patient not able to verbalize what is upsetting him at this time.  Patient walks off when writer is trying to speak to him.

## 2015-03-05 NOTE — Progress Notes (Signed)
Recreation Therapy Notes  Animal-Assisted Activity (AAA) Program Checklist/Progress Notes Patient Eligibility Criteria Checklist & Daily Group note for Rec Tx Intervention  Date: 01.31.2017 Time: 2:45pm Location: [address]    AAA/T Program Assumption of Risk Form signed by Patient/ or Parent Legal Guardian  Yes  Patient is free of allergies or sever asthma yes  Patient reports no fear of animals yes  Patient reports no history of cruelty to animals yes  Patient understands his/her participation is voluntary yes  Patient washes hands before animal contact yes  Patient washes hands after animal contact yes  Behavioral Response: Appropriate   Education: Hand Washing, Appropriate Animal Interaction   Education Outcome: Acknowledges education.   Clinical Observations/Feedback: Patient interacted appropriately with therapy dog and peers during session. Additionally he shared stories about his pets at home with peers in session.   Marykay Lex Deandria Klute, LRT/CTRS  Cornie Herrington L 03/05/2015 3:22 PM
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
## 2015-03-05 NOTE — Progress Notes (Signed)
Patient ID: Michael Costa, male   DOB: 01/10/1991, 25 y.o.   MRN: [id number] Adult Psychoeducational Group Note  Date:  03/05/2015 Time: 10:50am  Group Topic/Focus:  Orientation:   The focus of this group is to educate the patient on the purpose and policies of crisis stabilization and provide a format to answer questions about their admission.  The group details unit policies and expectations of patients while admitted.  Participation Level:  Active  Participation Quality:  Appropriate  Affect:  Appropriate  Cognitive:  Appropriate  Insight: Appropriate  Engagement in Group:  Engaged  Modes of Intervention:  Education, Orientation and Support  Additional Comments:  Pt supported peers in group today, active participation.   Aurora Mask 03/05/2015, 11:53 AM

## 2015-03-06 ENCOUNTER — Encounter (HOSPITAL_COMMUNITY): Payer: Self-pay | Admitting: Psychiatry

## 2015-03-06 DIAGNOSIS — F1120 Opioid dependence, uncomplicated: Secondary | ICD-10-CM

## 2015-03-06 DIAGNOSIS — F1010 Alcohol abuse, uncomplicated: Secondary | ICD-10-CM

## 2015-03-06 DIAGNOSIS — F321 Major depressive disorder, single episode, moderate: Principal | ICD-10-CM

## 2015-03-06 LAB — TSH: TSH: 0.287 u[IU]/mL — ABNORMAL LOW (ref 0.350–4.500)

## 2015-03-06 MED ORDER — BIOTENE DRY MOUTH MT LIQD
15.0000 mL | OROMUCOSAL | Status: DC | PRN
  Administered 2015-03-07: 15 mL via OROMUCOSAL
  Filled 2015-03-06 (×2): qty 15

## 2015-03-06 NOTE — Progress Notes (Signed)
[facility] MD Progress Note  03/06/2015 1:36 PM Michael Costa  MRN:  [id number] Subjective:  25 Y/O male who states that wants to quit using drugs and alcohol as he does not like who he has become. Admits there is stress in the household because of it. He went to school until the 10 th grade. Started not caring started using drugs. He states if he is clean he feels OK. Right now he is not working what increases his stress level. Has done mill work, Aeronautical engineer. If he continues to use he is not going to be able to find and keep a job Principal Problem: MDD (major depressive disorder), single episode, moderate (HCC) Diagnosis:   Patient Active Problem List   Diagnosis Date Noted  . MDD (major depressive disorder), single episode, moderate (HCC) [F32.1] 03/05/2015  . 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 Alcohol use disorder, mild, abuse [F10.10] 03/05/2015  . 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 Tobacco use disorder [F17.200] 03/05/2015  . 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 Polysubstance abuse     opiates, benzos, marijuana   History reviewed. No pertinent past surgical history. Family History:  Family History  Problem Relation Age of Onset  . Mental illness Neg Hx    Family Psychiatric  History: denies family history alcohol or drugs or mental iilnness  Social History:  History  Alcohol Use  . 3.0 oz/week  . 5 Cans of beer per week     History  Drug Use  . Yes  . Special: Marijuana, Hydrocodone, Oxycodone, Benzodiazepines    Comment: pain pills, opiates, benzos    Social History   Social History  . Marital Status: Single    Spouse Name: N/A  . Number of Children: N/A  . Years of Education:  N/A   Social History Main Topics  . Smoking status: Current Every Day Smoker -- 2.00 packs/day    Types: Cigarettes  . Smokeless tobacco: None  . Alcohol Use: 3.0 oz/week    5 Cans of beer per week  . Drug Use: Yes    Special: Marijuana, Hydrocodone, Oxycodone, Benzodiazepines     Comment: pain pills, opiates, benzos  . Sexual Activity: Yes    Birth Control/ Protection: None   Other Topics Concern  . None   Social History Narrative  10 th grade drop out started not caring was using drugs. Lives with grandmother sister has worked in Naval architect Social History:    History of alcohol / drug use?: Yes                    Sleep: Fair  Appetite:  Poor  Current Medications: Current Facility-Administered Medications  Medication Dose Route Frequency Provider Last Rate Last Dose  . alum & mag hydroxide-simeth (MAALOX/MYLANTA) 200-200-20 MG/5ML suspension 30 mL  30 mL Oral Q4H PRN Thermon Leyland, NP      . chlordiazePOXIDE (LIBRIUM) capsule 25 mg  25 mg Oral Q6H PRN Sanjuana Kava, NP      . chlordiazePOXIDE (LIBRIUM) capsule 25 mg  25 mg Oral TID Sanjuana Kava, NP   25 mg at 03/06/15 1213   Followed by  . [START ON 03/07/2015] chlordiazePOXIDE (LIBRIUM) capsule 25 mg  25  mg Oral BH-qamhs Sanjuana Kava, NP       Followed by  . [START ON 03/08/2015] chlordiazePOXIDE (LIBRIUM) capsule 25 mg  25 mg Oral Daily Sanjuana Kava, NP      . citalopram (CELEXA) tablet 10 mg  10 mg Oral Daily Sanjuana Kava, NP   10 mg at 03/06/15 0817  . cloNIDine (CATAPRES) tablet 0.1 mg  0.1 mg Oral QID Sanjuana Kava, NP   0.1 mg at 03/06/15 1213   Followed by  . [START ON 03/07/2015] cloNIDine (CATAPRES) tablet 0.1 mg  0.1 mg Oral BH-qamhs Sanjuana Kava, NP       Followed by  . [START ON 03/09/2015] cloNIDine (CATAPRES) tablet 0.1 mg  0.1 mg Oral QAC breakfast Sanjuana Kava, NP      . dicyclomine (BENTYL) tablet 20 mg  20 mg Oral Q6H PRN Sanjuana Kava, NP      . hydrOXYzine (ATARAX/VISTARIL)  tablet 25 mg  25 mg Oral Q6H PRN Sanjuana Kava, NP      . loperamide (IMODIUM) capsule 2-4 mg  2-4 mg Oral PRN Sanjuana Kava, NP      . magnesium hydroxide (MILK OF MAGNESIA) suspension 30 mL  30 mL Oral Daily PRN Thermon Leyland, NP      . methocarbamol (ROBAXIN) tablet 500 mg  500 mg Oral Q8H PRN Sanjuana Kava, NP      . multivitamin with minerals tablet 1 tablet  1 tablet Oral Daily Sanjuana Kava, NP   1 tablet at 03/06/15 1914  . naproxen (NAPROSYN) tablet 500 mg  500 mg Oral BID PRN Sanjuana Kava, NP      . nicotine (NICODERM CQ - dosed in mg/24 hours) patch 21 mg  21 mg Transdermal Daily Rachael Fee, MD   21 mg at 03/06/15 0816  . ondansetron (ZOFRAN-ODT) disintegrating tablet 4 mg  4 mg Oral Q6H PRN Sanjuana Kava, NP      . thiamine (VITAMIN B-1) tablet 100 mg  100 mg Oral Daily Sanjuana Kava, NP   100 mg at 03/06/15 0817  . traZODone (DESYREL) tablet 50 mg  50 mg Oral QHS Sanjuana Kava, NP   50 mg at 03/05/15 2110    Lab Results:  Results for orders placed or performed during the hospital encounter of 03/04/15 (from the past 48 hour(s))  TSH     Status: Abnormal   Collection Time: 03/06/15  6:48 AM  Result Value Ref Range   TSH 0.287 (L) 0.350 - 4.500 uIU/mL    Comment: Performed at [facility]    Physical Findings: AIMS: Facial and Oral Movements Muscles of Facial Expression: None, normal Lips and Perioral Area: None, normal Jaw: None, normal Tongue: None, normal,Extremity Movements Upper (arms, wrists, hands, fingers): None, normal Lower (legs, knees, ankles, toes): None, normal, Trunk Movements Neck, shoulders, hips: None, normal, Overall Severity Severity of abnormal movements (highest score from questions above): None, normal Incapacitation due to abnormal movements: None, normal Patient's awareness of abnormal movements (rate only patient's report): No Awareness, Dental Status Current problems with teeth and/or dentures?: Yes (deental caries) Does  patient usually wear dentures?: No  CIWA:  CIWA-Ar Total: 1 COWS:  COWS Total Score: 1  Musculoskeletal: Strength & Muscle Tone: within normal limits Gait & Station: normal Patient leans: normal  Psychiatric Specialty Exam: Review of Systems  Constitutional: Positive for malaise/fatigue.  HENT: Negative.   Eyes: Negative.  Respiratory:       Pack a day  Gastrointestinal: Positive for diarrhea.  Genitourinary: Negative.   Musculoskeletal: Positive for back pain.  Skin: Negative.   Neurological: Positive for dizziness and weakness.  Endo/Heme/Allergies: Negative.   Psychiatric/Behavioral: Positive for depression. The patient is nervous/anxious.     Blood pressure 124/68, pulse 115, temperature 97.7 F (36.5 C), temperature source Oral, resp. rate 16, height  (1.676 m), weight 69.854 kg (154 lb), SpO2 99 %.Body mass index is 24.87 kg/(m^2).  General Appearance: Fairly Groomed  Patent attorney::  Fair  Speech:  Clear and Coherent and Slow  Volume:  Decreased  Mood:  Anxious and worried  Affect:  anxious worried  Thought Process:  Coherent and Goal Directed  Orientation:  Full (Time, Place, and Person)  Thought Content:  symptomse events worries concerns  Suicidal Thoughts:  No  Homicidal Thoughts:  No  Memory:  Immediate;   Fair Recent;   Fair Remote;   Fair  Judgement:  Fair  Insight:  Present and Shallow  Psychomotor Activity:  Restlessness  Concentration:  Fair  Recall:  [employer] of Knowledge:Fair  Language: Fair  Akathisia:  No  Handed:  Left  AIMS (if indicated):     Assets:  Desire for Improvement Housing  ADL's:  Intact  Cognition: WNL  Sleep:  Number of Hours: 5.25   Treatment Plan Summary: Daily contact with patient to assess and evaluate symptoms and progress in treatment and Medication management Supportive approach/coping skills Polysubstance dependence; continue the Librium Clonidine detox protocol/work a relapse prevention plan Depression;  continue Celexa 10 mg increase to 20 mg daily Insomnia; continue the Trazodone 50 mg daily PRN Work with CBT/mindfulness Explore residential treatment options Carthel Castille A, MD 03/06/2015, 1:36 PM

## 2015-03-06 NOTE — [facility] Group Notes (Signed)
[facility] LCSW Group Therapy  03/06/2015 3:37 PM  Type of Therapy:  Group Therapy  Participation Level:  Did Not Attend-Pt chose to remain in bed.   Summary of Progress/Problems: Today's Topic: Overcoming Obstacles. Patients identified one short term goal and potential obstacles in reaching this goal. Patients processed barriers involved in overcoming these obstacles. Patients identified steps necessary for overcoming these obstacles and explored motivation (internal and external) for facing these difficulties head on.   Smart, Billie Trager LCSW 03/06/2015, 3:37 PM

## 2015-03-06 NOTE — Progress Notes (Addendum)
Patient ID: Michael Costa, male   DOB: 1990-09-15, 25 y.o.   MRN: [id number]  Pt currently presents with an appropriate affect and cooperative behavior. Per self inventory, pt rates depression at a 8, hopelessness 5 and anxiety 7. Pt's daily goal is "not being distracted" and they intend to do so by "staying positive." Pt reports good sleep, a fair appetite, low energy and poor concentration. Pt currently not showing any signs of withdrawal this morning.   Pt provided with medications per providers orders. Pt's labs and vitals were monitored throughout the day. Pt supported emotionally and encouraged to express concerns and questions. Pt educated on medications and assertiveness techniques.   Pt's safety ensured with 15 minute and environmental checks. Pt currently denies SI/HI and A/V hallucinations. Pt verbally agrees to seek staff if SI/HI or A/VH occurs and to consult with staff before acting on these thoughts. Pt reports that he feels he needs to get out of his current living situation. Pt states that he spends most of his time at home trying to take care of his grandmother, sister and niece. Pt wishes to go to [facility] at discharge. Will continue POC.

## 2015-03-06 NOTE — Progress Notes (Signed)
Pt attended NA meeting this evening.  

## 2015-03-06 NOTE — [facility] Group Notes (Signed)
[facility] LCSW Aftercare Discharge Planning Group Note   03/06/2015 9:22 AM  Participation Quality:  Appropriate   Mood/Affect:  Appropriate  Depression Rating:  5  Anxiety Rating:  "low"  Thoughts of Suicide:  No Will you contract for safety?   NA  Current AVH:  No  Plan for Discharge/Comments:  Pt reports that he is willing to go to [facility] at d/c if they have a bed available. 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   Transportation Means: unknown at this time   Supports: mother, grandmother, sister  Smart, Herbert Seta LCSW

## 2015-03-06 NOTE — Progress Notes (Signed)
[facility] Group Notes:  (Nursing/MHT/Case Management/Adjunct)  Date:  03/06/2015  Time:  6:59 PM  Type of Therapy:  Psychoeducational Skills  Participation Level:  Active  Participation Quality:  Appropriate, Attentive and Sharing  Affect:  Appropriate  Cognitive:  Appropriate  Insight:  Appropriate, Good and Improving  Engagement in Group:  Engaged  Modes of Intervention:  Discussion, Education, Limit-setting, Socialization and Support  Summary of Progress/Problems: Pt attended group on personal development. Pt shared what values in the workbook that are important to patient. Pt also used the values oriented worksheet to complete ways to improve values. Pt was flat and spoke when asked to share. Pt was pleasant appropriate.  Karleen Hampshire Brittini 03/06/2015, 6:59 PM

## 2015-03-06 NOTE — Plan of Care (Signed)
Problem: Alteration in mood & ability to function due to Goal: STG-Patient will attend groups Outcome: Progressing Pt attends groups on the unit today     

## 2015-03-07 MED ORDER — TRAZODONE HCL 50 MG PO TABS: 50.0000 mg | ORAL_TABLET | Freq: Every day | ORAL | Status: DC

## 2015-03-07 MED ORDER — NICOTINE 21 MG/24HR TD PT24: 21.0000 mg | MEDICATED_PATCH | Freq: Every day | TRANSDERMAL | Status: DC

## 2015-03-07 MED ORDER — CITALOPRAM HYDROBROMIDE 10 MG PO TABS: 10.0000 mg | ORAL_TABLET | Freq: Every day | ORAL | Status: DC

## 2015-03-07 NOTE — [facility] Suicide Risk Assessment (Signed)
[facility] Discharge Suicide Risk Assessment   Principal Problem: MDD (major depressive disorder), single episode, moderate (HCC) Discharge Diagnoses:  Patient Active Problem List   Diagnosis Date Noted  . MDD (major depressive disorder), single episode, moderate (HCC) [F32.1] 03/05/2015  . 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 Alcohol use disorder, mild, abuse [F10.10] 03/05/2015  . 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 Tobacco use disorder [F17.200] 03/05/2015  . 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   HENT: Negative.   Eyes: Negative.   Respiratory: Negative.   Cardiovascular: Negative.   Gastrointestinal: Negative.   Genitourinary: Negative.   Musculoskeletal: Negative.   Skin: Negative.   Neurological: Negative.   Endo/Heme/Allergies: Negative.   Psychiatric/Behavioral: Positive for depression and substance abuse.    Blood pressure 107/57, pulse 79, temperature 97.8 F (36.6 C), temperature source Oral, resp. rate 17, height  (1.676 m), weight 69.854 kg (154 lb), SpO2 99 %.Body mass index is 24.87 kg/(m^2).  General Appearance: Fairly Groomed  Patent attorney::  Fair  Speech:  Clear and Coherent409  Volume:  Normal  Mood:  Euthymic  Affect:  Appropriate  Thought Process:  Coherent and Goal Directed  Orientation:  Full (Time, Place, and Person)  Thought Content:  plans as he moves on, relapse prevention plan  Suicidal Thoughts:  No  Homicidal Thoughts:  No  Memory:  Immediate;   Fair Recent;   Fair Remote;   Fair  Judgement:  Fair  Insight:  Present  Psychomotor Activity:  Normal  Concentration:  Fair  Recall:   [employer] of Knowledge:Fair  Language: Fair  Akathisia:  No  Handed:  Right  AIMS (if indicated):     Assets:  Desire for Improvement Housing Social Support  Sleep:  Number of Hours: 5.75  Cognition: WNL  ADL's:  Intact  Elmo states he feels ready to go home today. He declined a bed at [facility] today. States he can  Take it from here. States he feels really good and is willing to pursue outpatient treatment to be sure he does not fall back into the same old habits behaviors. He is planning to get a job ASAP and get busy Mental Status Per Nursing Assessment::   On Admission:  NA  Demographic Factors:  Male, Adolescent or young adult and Caucasian  Loss Factors: Financial problems/change in socioeconomic status  Historical Factors: none identified  Risk Reduction Factors:   Sense of responsibility to family, Living with another person, especially a relative and Positive social support  Continued Clinical Symptoms:  Alcohol/Substance Abuse/Dependencies  Cognitive Features That Contribute To Risk:  None    Suicide Risk:  Minimal: No identifiable suicidal ideation.  Patients presenting with no risk factors but with morbid ruminations; may be classified as minimal risk based on the severity of the depressive symptoms  Follow-up Information    Follow up with Arna Medici  On 03/11/2015.   Why:  Appt on this date at 8:00AM for hospital follow-up. Please bring the following: Photo ID, Social Security Card, and any proof of income (if any).    Contact information:   [address] [place], [place] [address] Phone: [phone] Fax: [phone]      Plan Of Care/Follow-up recommendations:  Activity:  as tolerated Diet:  regular  Caleigha Zale A, MD 03/07/2015, 2:22 PM

## 2015-03-07 NOTE — Discharge Summary (Signed)
Physician Discharge Summary Note  Patient:  Michael Costa is an 25 y.o., male MRN:  [id number] DOB:  10/20/1990 Patient phone:  [phone] (home)  Patient address:   [address] [place] [address],  Total Time spent with patient: Greater than 30 minutes  Date of Admission:  03/04/2015 Date of Discharge: 03-07-15  Reason for Admission:  Opioid & alcohol induced mood disorder  Principal Problem: MDD (major depressive disorder), single episode, moderate (HCC)  Discharge Diagnoses: Patient Active Problem List   Diagnosis Date Noted  . MDD (major depressive disorder), single episode, moderate (HCC) [F32.1] 03/05/2015  . 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 Alcohol use disorder, mild, abuse [F10.10] 03/05/2015  . 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 Tobacco use disorder [F17.200] 03/05/2015  . 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 Polysubstance abuse     opiates, benzos, marijuana   History reviewed. No pertinent past surgical history. Family History:  Family History  Problem Relation Age of Onset  . Mental illness Neg Hx    Family Psychiatric  History: See H&P  Social History:  History  Alcohol Use  . 3.0 oz/week  . 5 Cans of beer per week     History  Drug Use  . Yes  . Special: Marijuana, Hydrocodone, Oxycodone, Benzodiazepines    Comment: pain pills, opiates, benzos    Social History   Social History  . Marital Status: Single    Spouse Name: N/A  . Number of Children: N/A  . Years of Education: N/A   Social History Main Topics  . Smoking status: Current Every Day Smoker -- 2.00 packs/day    Types: Cigarettes  . Smokeless tobacco: None  . Alcohol Use: 3.0 oz/week    5 Cans of beer per week  . Drug Use: Yes    Special:  Marijuana, Hydrocodone, Oxycodone, Benzodiazepines     Comment: pain pills, opiates, benzos  . Sexual Activity: Yes    Birth Control/ Protection: None   Other Topics Concern  . None   Social History Narrative   Hospital Course: Michael Costa is a 25 year old Caucasian male. Ad[facility] to [facility] from the [name] with complaints of polysubstance use disorder, depressed mood & the thought of violence towards others. Michael Costa reports that this is his first inpatient psychiatric admission. During this admission assessment, he reports, "My family took me to the [name] yesterday (03-03-15). I have been having a lot of stuff happening in my life. My parents are having problems, then they split up. I have been depressed since. I have been using pills that I buy off the the streets. I have been using x 4 years. I use just to get away from my reality. I also use to get high. This is not who I'm. I was not like this in the past. Since started using drugs, I don't like to be around people. I don't talk much any more. I drink occasional alcohol (bud light), may be 2-3 cans at a time. Depressed for 4 years. I have not been treated for depression. I have not been to any substance abuse treatment program. I have not been sober. I'm not suicidal or homicidal. I have occasional thought of violence, but I will walk away from violence before I will get involved in one. I have  been using the Roxys & the hydrocodones. I had used some Subutex about 8 months ago. I did not do pain pills for 2 weeks. Is there any way that I can be put on Subutex for my opioid addiction". Michael Costa will be receiving detox treatments for Benzodiazepine & opioids. He will be started on an antidepressant.    Michael Costa was admitted to the hospital with his UDS test results positive for Amphetamine, Benzodiazepine & cocaine. He also admitted using opioids as well. He was presenting with substance withdrawal symptoms. He reported having been very  depressed related to familial problems. He was in need of drug detox as well as mood stabilization treatments. He says he has never been treated for depression & would like to get started on antidepressant during his hospital stay. Michael Costa also stated that he has never had substance abuse treatment, but was not willing to commit to participating in one after discharge. He preferred an outpatient substance abuse treatment.  During the course of his hospitalization, Michael Costa received both Clonidine & Librium detoxification treatment protocols for opioid/benzodiazepine detox. He was also medicated & discharged on; Citalopram 10 mg for depression, Hydroxyzine 25 mg prn for anxiety & trazodone 50 mg for insomnia. He presented no other pre-existing medical issues that required treatment or monitoring. However, he tolerated his treatment regimen without any adverse effects or reactions reported. Michael Costa was enrolled & participated in the group counseling sessions, AA/Na meetings being offered & held on this unit. He learned coping skills that should help him to cope better after discharge to maintain sobriety & mood stability.  Michael Costa has completed detox treatment & his mood is stable. This is evidenced by his reports of improved mood, absence of suicidal/violent thoughts, presentation of good affects & absence of substance withdrawal symptoms. He is currently being discharged to continue psychiatric & substance abuse treatments on an outpatient basis at the [facility] clinic in [place], [place]. He is provided with all the necessary information needed to make this appointment without problems.   Upon discharge, Hence adamantly denies any SIHI, AVH, delusional thoughts or paranoia. He is provided with a 7 days worth, supply samples of his [name] discharge medications. [name] left [facility] with all personal belonging in no apparent distress. Transportation per parents.   Physical Findings: AIMS: Facial and Oral Movements Muscles  of Facial Expression: None, normal Lips and Perioral Area: None, normal Jaw: None, normal Tongue: None, normal,Extremity Movements Upper (arms, wrists, hands, fingers): None, normal Lower (legs, knees, ankles, toes): None, normal, Trunk Movements Neck, shoulders, hips: None, normal, Overall Severity Severity of abnormal movements (highest score from questions above): None, normal Incapacitation due to abnormal movements: None, normal Patient's awareness of abnormal movements (rate only patient's report): No Awareness, Dental Status Current problems with teeth and/or dentures?: Yes (deental caries) Does patient usually wear dentures?: No  CIWA:  CIWA-Ar Total: 0 COWS:  COWS Total Score: 0  Musculoskeletal: Strength & Muscle Tone: within normal limits Gait & Station: normal Patient leans: N/A  Psychiatric Specialty Exam: Review of Systems  Constitutional: Negative.   HENT: Negative.   Eyes: Negative.   Respiratory: Negative.   Cardiovascular: Negative.   Gastrointestinal: Negative.   Genitourinary: Negative.   Musculoskeletal: Negative.   Skin: Negative.   Neurological: Negative.   Endo/Heme/Allergies: Negative.   Psychiatric/Behavioral: Positive for depression (Stable), hallucinations and substance abuse (Hx Opioid & alcohol abuse). Negative for suicidal ideas and memory loss. The patient has insomnia (Stable). The patient is not nervous/anxious.  Blood pressure 107/57, pulse 79, temperature 97.8 F (36.6 C), temperature source Oral, resp. rate 17, height  (1.676 m), weight 69.854 kg (154 lb), SpO2 99 %.Body mass index is 24.87 kg/(m^2).  See Md's SRA   Have you used any form of tobacco in the last 30 days? (Cigarettes, Smokeless Tobacco, Cigars, and/or Pipes): Yes  Has this patient used any form of tobacco in the last 30 days? (Cigarettes, Smokeless Tobacco, Cigars, and/or Pipes) Yes, Yes, A prescription for an FDA-approved tobacco cessation medication was offered at  discharge and the patient refused  Metabolic Disorder Labs:  No results found for: HGBA1C, MPG No results found for: PROLACTIN No results found for: CHOL, TRIG, HDL, CHOLHDL, VLDL, LDLCALC  See Psychiatric Specialty Exam and Suicide Risk Assessment completed by Attending Physician prior to discharge.  Discharge destination:  Home  Is patient on multiple antipsychotic therapies at discharge:  No   Has Patient had three or more failed trials of antipsychotic monotherapy by history:  No  Recommended Plan for Multiple Antipsychotic Therapies: NA    Medication List    TAKE these medications      Indication   citalopram 10 MG tablet  Commonly known as:  CELEXA  Take 1 tablet (10 mg total) by mouth daily. For depression   Indication:  Depression     nicotine 21 mg/24hr patch  Commonly known as:  NICODERM CQ - dosed in mg/24 hours  Place 1 patch (21 mg total) onto the skin daily. For smoking cessation   Indication:  Nicotine Addiction     traZODone 50 MG tablet  Commonly known as:  DESYREL  Take 1 tablet (50 mg total) by mouth at bedtime. For sleep   Indication:  Trouble Sleeping       Follow-up Information    Follow up with Arna Medici  On 03/11/2015.   Why:  Appt on this date at 8:00AM for hospital follow-up. Please bring the following: Photo ID, Social Security Card, and any proof of income (if any).    Contact information:   [address] [place], [place] [address] Phone: [phone] Fax: [phone]     Follow-up recommendations: Activity:  As tolerated Diet: As recommended by your primary care doctor. Keep all scheduled follow-up appointments as recommended.   Comments: Take all your medications as prescribed by your mental healthcare provider. Report any adverse effects and or reactions from your medicines to your outpatient provider promptly. Patient is instructed and cautioned to not engage in alcohol and or illegal drug use while on prescription medicines. In  the event of worsening symptoms, patient is instructed to call the crisis hotline, 911 and or go to the nearest ED for appropriate evaluation and treatment of symptoms. Follow-up with your primary care provider for your other medical issues, concerns and or health care needs.   Signed: Sanjuana Kava, NP, PMHNP-BC 03/07/2015, 1:25 PM I personally assessed the patient and formulated the plan Madie Reno A. Dub Mikes, M.D.

## 2015-03-07 NOTE — [facility] Group Notes (Signed)
[facility] Group Notes:  (Nursing/MHT/Case Management/Adjunct)  Date:  03/07/2015  Time:  0900  Type of Therapy:  Nurse Education  Participation Level:  Did Not Attend  Participation Quality:    Affect:    Cognitive:    Insight:    Engagement in Group:    Modes of Intervention:    Summary of Progress/Problems: Patient invited by staff however elected to remain in bed.  Merian Capron [facility] 03/07/2015, 0930

## 2015-03-07 NOTE — [facility] Suicide Risk Assessment (Signed)
[facility] INPATIENT:  Family/Significant Other Suicide Prevention Education  Suicide Prevention Education:  Education Completed; Kristian Covey (pt's stepfather) [phone] has been identified by the patient as the family member/significant other with whom the patient will be residing, and identified as the person(s) who will aid the patient in the event of a mental health crisis (suicidal ideations/suicide attempt).  With written consent from the patient, the family member/significant other has been provided the following suicide prevention education, prior to the and/or following the discharge of the patient.  The suicide prevention education provided includes the following:  Suicide risk factors  Suicide prevention and interventions  National Suicide Hotline telephone number  [facility] assessment telephone number  [facility] Emergency Assistance 911  [facility] and/or Residential Mobile Crisis Unit telephone number  Request made of family/significant other to:  Remove weapons (e.g., guns, rifles, knives), all items previously/currently identified as safety concern.    Remove drugs/medications (over-the-counter, prescriptions, illicit drugs), all items previously/currently identified as a safety concern.  The family member/significant other verbalizes understanding of the suicide prevention education information provided.  The family member/significant other agrees to remove the items of safety concern listed above.  Smart, Shawnn Bouillon LCSW 03/07/2015, 11:17 AM

## 2015-03-07 NOTE — Progress Notes (Signed)
  [facility] Adult Case Management Discharge Plan :  Will you be returning to the same living situation after discharge:  Yes,  home At discharge, do you have transportation home?: Yes,  parent Do you have the ability to pay for your medications: Yes,  medication management  Release of information consent forms completed and submitted to medical records by CSW.  Patient to Follow up at: Follow-up Information    Follow up with Arna Medici  On 03/11/2015.   Why:  Appt on this date at 8:00AM for hospital follow-up. Please bring the following: Photo ID, Social Security Card, and any proof of income (if any).    Contact information:   [address] [place], [place] [address] Phone: [phone] Fax: [phone]      Next level of care provider has access to [facility] Link:no  Safety Planning and Suicide Prevention discussed: Yes,  SPE completed with pt's stepfather. SPI pamphlet provided to pt and she was encouraged to share information with support network, ask questions, and talk about any concerns relating to SPE.  Have you used any form of tobacco in the last 30 days? (Cigarettes, Smokeless Tobacco, Cigars, and/or Pipes): Yes  Has patient been referred to the Quitline?: Patient refused referral  Patient has been referred for addiction treatment: Pt. refused referral-accepted to [facility]. Declined bed.   Smart, Tiarah Shisler LCSW 03/07/2015, 12:43 PM
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
## 2015-03-07 NOTE — Progress Notes (Signed)
Patient verbalizes readiness for discharge however mom concerned patient will relapse. Mom and patient spoke and decided to proceed with discharge. Follow up plan explained, Rx's given along with samples. All belongings returned, as well as belongings previously believed to be missing. Patient verbalizes understanding. Denies SI/HI and assures this Clinical research associate he will seek assistance should that change. Patient discharged ambulatory and in stable condition to mother. Lawrence Marseilles

## 2015-03-07 NOTE — Progress Notes (Signed)
D  Pt is appropriate and pleasant   He does report some anxiety and depression which he said is improving  He can be attention seeking at times   He does endorse desire to quit using drugs and ETOH    He reports minimal withdrawal symptoms A   Verbal support and encouragement given   Medications administered and effectiveness monitored   Q 15 min checks R   Pt safe at present

## 2015-03-07 NOTE — Progress Notes (Signed)
Patient up and visible in the milieu today. Reports feeling sleepy but denies pain or other physical complaints. Denies withdrawal. Rates his depression, hopelessness and anxiety all at a 1/10. Medicated per orders, no prn's requested or needed. Emotional support offered. Per Dr. Lugo, patient verbaDub Mikesizing readiness and desire to discharge today. 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 Awaiting discharge order. Michael Costa

## 2015-04-07 ENCOUNTER — Emergency Department (HOSPITAL_COMMUNITY)
Admission: EM | Admit: 2015-04-07 | Discharge: 2015-04-07 | Disposition: A | Discharge status: Other Acute Inpatient Hospital | Payer: Self-pay | Attending: Emergency Medicine | Admitting: Emergency Medicine

## 2015-04-07 ENCOUNTER — Encounter (HOSPITAL_COMMUNITY): Payer: Self-pay | Admitting: *Deleted

## 2015-04-07 ENCOUNTER — Inpatient Hospital Stay (HOSPITAL_COMMUNITY)
Admission: AD | Admit: 2015-04-07 | Discharge: 2015-04-15 | DRG: 885 | Disposition: A | Discharge status: Home / Self Care | Payer: No Typology Code available for payment source | Source: Intra-hospital | Attending: Psychiatry | Admitting: Psychiatry

## 2015-04-07 ENCOUNTER — Encounter (HOSPITAL_COMMUNITY): Payer: Self-pay

## 2015-04-07 DIAGNOSIS — R441 Visual hallucinations: Secondary | ICD-10-CM | POA: Insufficient documentation

## 2015-04-07 DIAGNOSIS — F17210 Nicotine dependence, cigarettes, uncomplicated: Secondary | ICD-10-CM | POA: Insufficient documentation

## 2015-04-07 DIAGNOSIS — R440 Auditory hallucinations: Secondary | ICD-10-CM | POA: Insufficient documentation

## 2015-04-07 DIAGNOSIS — R451 Restlessness and agitation: Secondary | ICD-10-CM | POA: Insufficient documentation

## 2015-04-07 DIAGNOSIS — F333 Major depressive disorder, recurrent, severe with psychotic symptoms: Secondary | ICD-10-CM | POA: Diagnosis not present

## 2015-04-07 DIAGNOSIS — R45851 Suicidal ideations: Secondary | ICD-10-CM | POA: Diagnosis present

## 2015-04-07 DIAGNOSIS — Z79899 Other long term (current) drug therapy: Secondary | ICD-10-CM | POA: Insufficient documentation

## 2015-04-07 DIAGNOSIS — R443 Hallucinations, unspecified: Secondary | ICD-10-CM

## 2015-04-07 DIAGNOSIS — F329 Major depressive disorder, single episode, unspecified: Secondary | ICD-10-CM | POA: Diagnosis present

## 2015-04-07 LAB — COMPREHENSIVE METABOLIC PANEL
ALBUMIN: 4.7 g/dL (ref 3.5–5.0)
ALK PHOS: 50 U/L (ref 38–126)
ALT: 13 U/L — AB (ref 17–63)
AST: 17 U/L (ref 15–41)
Anion gap: 13 (ref 5–15)
BILIRUBIN TOTAL: 1 mg/dL (ref 0.3–1.2)
BUN: 7 mg/dL (ref 6–20)
CALCIUM: 9.6 mg/dL (ref 8.9–10.3)
CO2: 24 mmol/L (ref 22–32)
CREATININE: 0.82 mg/dL (ref 0.61–1.24)
Chloride: 102 mmol/L (ref 101–111)
GFR calc Af Amer: >60 mL/min (ref >60)
GFR calc non Af Amer: >60 mL/min (ref >60)
GLUCOSE: 93 mg/dL (ref 65–99)
Potassium: 3.7 mmol/L (ref 3.5–5.1)
SODIUM: 139 mmol/L (ref 135–145)
TOTAL PROTEIN: 7.5 g/dL (ref 6.5–8.1)

## 2015-04-07 LAB — CBC
HCT: 42.9 % (ref 39.0–52.0)
Hemoglobin: 15.1 g/dL (ref 13.0–17.0)
MCH: 30.8 pg (ref 26.0–34.0)
MCHC: 35.2 g/dL (ref 30.0–36.0)
MCV: 87.4 fL (ref 78.0–100.0)
PLATELETS: 276 10*3/uL (ref 150–400)
RBC: 4.91 MIL/uL (ref 4.22–5.81)
RDW: 12.6 % (ref 11.5–15.5)
WBC: 10.7 10*3/uL — ABNORMAL HIGH (ref 4.0–10.5)

## 2015-04-07 LAB — RAPID URINE DRUG SCREEN, HOSP PERFORMED
Amphetamines: NOT DETECTED
Barbiturates: NOT DETECTED
Benzodiazepines: NOT DETECTED
COCAINE: NOT DETECTED
OPIATES: NOT DETECTED
Tetrahydrocannabinol: NOT DETECTED

## 2015-04-07 LAB — ETHANOL: Alcohol, Ethyl (B): <5 mg/dL (ref <5)

## 2015-04-07 MED ORDER — ENSURE ENLIVE PO LIQD
237.0000 mL | Freq: Two times a day (BID) | ORAL | Status: DC
  Administered 2015-04-08 – 2015-04-15 (×13): 237 mL via ORAL

## 2015-04-07 MED ORDER — ACETAMINOPHEN 325 MG PO TABS
650.0000 mg | ORAL_TABLET | Freq: Four times a day (QID) | ORAL | Status: DC | PRN
  Administered 2015-04-12 – 2015-04-15 (×4): 650 mg via ORAL
  Filled 2015-04-07 (×4): qty 2

## 2015-04-07 MED ORDER — NICOTINE 21 MG/24HR TD PT24
21.0000 mg | MEDICATED_PATCH | Freq: Every day | TRANSDERMAL | Status: DC
  Administered 2015-04-09 – 2015-04-15 (×6): 21 mg via TRANSDERMAL
  Filled 2015-04-07 (×11): qty 1

## 2015-04-07 MED ORDER — INFLUENZA VAC SPLIT QUAD 0.5 ML IM SUSY
0.5000 mL | PREFILLED_SYRINGE | INTRAMUSCULAR | Status: DC
  Filled 2015-04-07: qty 0.5

## 2015-04-07 MED ORDER — OLANZAPINE 5 MG PO TABS
5.0000 mg | ORAL_TABLET | Freq: Every day | ORAL | Status: DC
  Administered 2015-04-07 – 2015-04-08 (×2): 5 mg via ORAL
  Filled 2015-04-07 (×5): qty 1

## 2015-04-07 MED ORDER — TRAZODONE HCL 50 MG PO TABS
50.0000 mg | ORAL_TABLET | Freq: Every evening | ORAL | Status: DC | PRN
  Administered 2015-04-07 – 2015-04-14 (×8): 50 mg via ORAL
  Filled 2015-04-07 (×8): qty 1

## 2015-04-07 MED ORDER — PNEUMOCOCCAL VAC POLYVALENT 25 MCG/0.5ML IJ INJ: 0.5000 mL | INJECTION | INTRAMUSCULAR | Status: DC

## 2015-04-07 NOTE — ED Provider Notes (Signed)
CSN: [id number]     Arrival date & time 04/07/15  1629 History   First MD Initiated Contact with Patient 04/07/15 1813     Chief Complaint  Patient presents with  . Psychiatric Evaluation     (Consider location/radiation/quality/duration/timing/severity/associated sxs/prior Treatment) The history is provided by the patient and medical records. No language interpreter was used.     Michael Costa is a 25 y.o. male  with a PMH of polysubstance abuse who presents to the Emergency Department for drug detox. Patient states he is a daily opioid drug user, but has not had any illicit drug use in the last 3 days. Patient states occasional alcohol use, with no ETOH in the last 4 days. Patient admits to visual hallucinations over the last 3 months, which he describes as lights and colors. Patient admits to auditory hallucinations which he describes as voices telling him to do "stupid things "patient denies suicidal ideations, however and admits to homicidal ideations-stating he occasionally to hurt people when he gets angry. Patient denies a plan, or any certain person in particular he would like to hurt. Patient admits to depressed mood over the last 2 days; also admits to mood swings over the last few months.   Past Medical History  Diagnosis Date  . Polysubstance abuse     opiates, benzos, marijuana   History reviewed. No pertinent past surgical history. Family History  Problem Relation Age of Onset  . Mental illness Neg Hx    Social History  Substance Use Topics  . Smoking status: Current Every Day Smoker -- 2.00 packs/day    Types: Cigarettes  . Smokeless tobacco: None  . Alcohol Use: 3.0 oz/week    5 Cans of beer per week    Review of Systems  Constitutional: Negative for fever and chills.  HENT: Negative for congestion and sore throat.   Respiratory: Negative for cough, shortness of breath and wheezing.   Cardiovascular: Negative.   Gastrointestinal: Negative for nausea, vomiting  and abdominal pain.  Genitourinary: Negative for dysuria.  Musculoskeletal: Negative for myalgias.  Skin: Negative for wound.  Neurological: Negative for dizziness, weakness and headaches.  Psychiatric/Behavioral: Positive for hallucinations and agitation. Negative for suicidal ideas and self-injury.      Allergies  Review of patient's allergies indicates no active allergies.  Home Medications   Prior to Admission medications   Medication Sig Start Date End Date Taking? Authorizing Provider  citalopram (CELEXA) 10 MG tablet Take 1 tablet (10 mg total) by mouth daily. For depression 03/07/15   Sanjuana Kava, NP  nicotine (NICODERM CQ - DOSED IN MG/24 HOURS) 21 mg/24hr patch Place 1 patch (21 mg total) onto the skin daily. For smoking cessation 03/07/15   Sanjuana Kava, NP  traZODone (DESYREL) 50 MG tablet Take 1 tablet (50 mg total) by mouth at bedtime. For sleep 03/07/15   Sanjuana Kava, NP   BP 131/76 mmHg  Pulse 89  Temp(Src) 98.3 F (36.8 C) (Oral)  Resp 16  Ht  (1.676 m)  Wt 71.668 kg  BMI 25.51 kg/m2  SpO2 96% Physical Exam  Constitutional: He is oriented to person, place, and time. He appears well-developed and well-nourished.  Alert and in no acute distress  HENT:  Head: Normocephalic and atraumatic.  Cardiovascular: Normal rate, regular rhythm and normal heart sounds.  Exam reveals no gallop and no friction rub.   No murmur heard. Pulmonary/Chest: Effort normal and breath sounds normal. No respiratory distress. He has no wheezes.  He has no rales.  Abdominal: Soft. He exhibits no distension. There is no tenderness.  Musculoskeletal: He exhibits no edema.  Neurological: He is alert and oriented to person, place, and time.  Skin: Skin is warm and dry. No rash noted.  Psychiatric: He has a normal mood and affect. His behavior is normal. Judgment and thought content normal.  Nursing note and vitals reviewed.   ED Course  Procedures (including critical care time) Labs  Review Labs Reviewed  COMPREHENSIVE METABOLIC PANEL - Abnormal; Notable for the following:    ALT 13 (*)    All other components within normal limits  CBC - Abnormal; Notable for the following:    WBC 10.7 (*)    All other components within normal limits  ETHANOL  URINE RAPID DRUG SCREEN, HOSP PERFORMED    Imaging Review No results found. I have personally reviewed and evaluated these images and lab results as part of my medical decision-making.   EKG Interpretation None      MDM   Final diagnoses:  Hallucinations   Michael Costa presents for drug detox . + auditory and visual hallucinations. All labs reviewed. Dispo pending TTS. Spoke with Dr. Ala DachFord who states patient has been [facility]. Accepting physician Dr. Jama Flavorsobos.   [facility] Pilcher Justise Ehmann, PA-C 04/07/15 2111  Pricilla LovelessScott Goldston, MD 04/10/15 [phone]

## 2015-04-07 NOTE — ED Notes (Signed)
Pt reports hallucinations x 3 months.  Pt reports when he closes eyes he sees red lights and hears unintelligible voices.  Pt reports he has been crushing and snorting Oxycodone daily x 1 year, several pills a day.  Has not had any in 3 days.  No other s/s noted.

## 2015-04-07 NOTE — ED Notes (Signed)
Called staffing for sitter 

## 2015-04-07 NOTE — Tx Team (Signed)
Initial Interdisciplinary Treatment Plan   PATIENT STRESSORS: Medication change or noncompliance "hallucinations"    PATIENT STRENGTHS: Ability for insight Average or above average intelligence Communication skills General fund of knowledge   PROBLEM LIST: Problem List/Patient Goals Date to be addressed Date deferred Reason deferred Estimated date of resolution  "to feel better about myself"  04/07/15     "start caring more"  04/07/15           psychosis 04/07/15     Substance abuse 04/07/15                              DISCHARGE CRITERIA:  Improved stabilization in mood, thinking, and/or behavior Motivation to continue treatment in a less acute level of care  PRELIMINARY DISCHARGE PLAN: Outpatient therapy  PATIENT/FAMIILY INVOLVEMENT: This treatment plan has been presented to and reviewed with the patient, Donata ClayJustin Vandewater.  The patient and family have been given the opportunity to ask questions and make suggestions.  Arrie AranChurch, Marche Hottenstein J 04/07/2015, 11:08 PM

## 2015-04-07 NOTE — ED Notes (Signed)
TTS called for eval. Family is here to see pt after consult. RN notified.

## 2015-04-07 NOTE — ED Notes (Signed)
[facility]. No sitter needed since pt is not IVC'd per GilliamShelly, AC.

## 2015-04-07 NOTE — ED Notes (Signed)
Pt given red scrubs to change into, belongings given to RN and security called for wanding of pt.

## 2015-04-07 NOTE — BH Assessment (Addendum)
Tele Assessment Note   Michael Costa is a single 25 y.o. Caucasian male who presents [facility] accompanied by his mother, who participated in assessment. Pt reports he needs help because he feels "my mind isn't right." He reports racing thoughts and that he "thinks stupid stuff." Pt states he hears voices that he cannot understand. He describes feeling depressed and having low self-esteem. Pt reports symptoms including crying spells, social withdrawal, loss of interest in usual pleasures, fatigue, irritability, decreased concentration, decreased sleep, decreased appetite and feelings of guilt and hopelessness. He reports passive suicidal ideation with no plan or intent. He denies any history of suicide attempts or intentional self-injurious behavior. Pt reports having thoughts of harming people which trouble him. He denies any plan or intent to harm anyone and denies any history of violence. Pt's mother says Pt has said he has thoughts of harming her. Pt's mother also reports Pt told her he sees "laser lights" but Pt denies this or other visual hallucinations. Pt has a history of abusing narcotic pain medications, benzodiazepines, alcohol and marijuana. Pt states he has not been using on a daily basis and reports his last use was four days ago. Pt's urine drug screen is negative and blood alcohol is less than five.  Pt's mother reports she is concerned about Pt's behavior. She reports Pt was sleeping and "flipped out" says that someone was lying on top of him and yelling "get off me." Pt denies this. Mother reports she called 911 today and [place] to Pt. Mother also reports Pt hasn't been eating or sleeping well. Pt also denies this. Pt and mother agree there are family conflicts.   [facility] and diagnosed with major depression and opioid, alcohol and benzodiazepine use. [facility] [place] [facility] [place]  [place]. Pt states he went once, did not return and was unable to provide a reason for not returning.  Pt is dressed in hospital scrubs, alert, oriented x4 with normal speech and normal motor behavior. Eye contact is good. Pt's mood is depressed and guilty and affect is congruent with mood. Thought process is coherent and relevant. Pt initially agreed to inpatient psychiatric treatment, then changed his mind and said he didn't want help but also didn't appear to have a safety plan for tonight. Pt then changed his mind again and agreed to inpatient psychiatric treatment.   Diagnosis: Major Depressive Disorder, recurrent, severe with psychotic features; Opioid Use Disorder  Past Medical History:  Past Medical History  Diagnosis Date  . Polysubstance abuse     opiates, benzos, marijuana    History reviewed. No pertinent past surgical history.  Family History:  Family History  Problem Relation Age of Onset  . Mental illness Neg Hx     Social History:  reports that he has been smoking Cigarettes.  He has been smoking about 2.00 packs per day. He does not have any smokeless tobacco history on file. He reports that he drinks about 3.0 oz of alcohol per week. He reports that he uses illicit drugs (Marijuana, Hydrocodone, Oxycodone, and Benzodiazepines).  Additional Social History:  Alcohol / Drug Use Pain Medications: yes Prescriptions: Pt reports he is not taking any medications Over the Counter: Denies Longest period of sobriety (when/how long): 10 days Negative Consequences of Use: Work / Programmer, multimediachool, Personal relationships Withdrawal Symptoms: Irritability Substance #2 Name of Substance 2: percocet 10 (snorting) 2 - Age of First  Use: unk 2 - Amount (size/oz): 3-4 10 mg 2 - Frequency: "every few days" 2 - Duration: years 2 - Last Use / Amount: 04/03/15 Substance #3 Name of Substance 3: xanax 3 - Age of First Use: unk 3 - Amount (size/oz): 2-3 1 mg 3 - Frequency: daily 3 - Duration:  years 3 - Last Use / Amount: 04/03/15  CIWA: CIWA-Ar BP: 131/76 mmHg Pulse Rate: 89 COWS:    PATIENT STRENGTHS: (choose at least two) Ability for insight Average or above average intelligence Capable of independent living Metallurgist fund of knowledge Motivation for treatment/growth Physical Health Supportive family/friends  Allergies: No Active Allergies  Home Medications:  (Not in a hospital admission)  OB/GYN Status:  No LMP for male patient.  General Assessment Data Location of Assessment: [facility] ED TTS Assessment: In system Is this a Tele or Face-to-Face Assessment?: Tele Assessment Is this an Initial Assessment or a Re-assessment for this encounter?: Initial Assessment Marital status: Single Maiden name: NA Is patient pregnant?: No Pregnancy Status: No Living Arrangements: Parent Can pt return to current living arrangement?: Yes Admission Status: Voluntary Is patient capable of signing voluntary admission?: Yes Referral Source: Self/Family/Friend Insurance type: Self-pay     Crisis Care Plan Living Arrangements: Parent Legal Guardian: Other: (None) Name of Psychiatrist: [facility] Name of Therapist: [facility]  Education Status Is patient currently in school?: No Current Grade: NA Highest grade of school patient has completed: 9 Name of school: NA Contact person: NA  Risk to self with the past 6 months Suicidal Ideation: Yes-Currently Present Has patient been a risk to self within the past 6 months prior to admission? : No Suicidal Intent: No Has patient had any suicidal intent within the past 6 months prior to admission? : No Is patient at risk for suicide?: No Suicidal Plan?: No Has patient had any suicidal plan within the past 6 months prior to admission? : No Access to Means: No Specify Access to Suicidal Means: Pt denies What has been your use of drugs/alcohol within the last 12 months?: Pt is using alcohol and  opioids Previous Attempts/Gestures: No How many times?: 0 Other Self Harm Risks: None Triggers for Past Attempts: None known Intentional Self Injurious Behavior: None Family Suicide History: No Recent stressful life event(s): Conflict (Comment) (Conflicts with family members) Persecutory voices/beliefs?: No Depression: Yes Depression Symptoms: Despondent, Tearfulness, Isolating, Fatigue, Guilt, Loss of interest in usual pleasures, Feeling worthless/self pity, Feeling angry/irritable Substance abuse history and/or treatment for substance abuse?: Yes Suicide prevention information given to non-admitted patients: Not applicable  Risk to Others within the past 6 months Homicidal Ideation: No Does patient have any lifetime risk of violence toward others beyond the six months prior to admission? : No Thoughts of Harm to Others: Yes-Currently Present Comment - Thoughts of Harm to Others: Pt reports he has thoughts of harming other people Current Homicidal Intent: No Current Homicidal Plan: No Access to Homicidal Means: No Describe Access to Homicidal Means: None Identified Victim: Mother History of harm to others?: No Assessment of Violence: None Noted Violent Behavior Description: Pt denies history of violence Does patient have access to weapons?: No Criminal Charges Pending?: Yes Describe Pending Criminal Charges: Driving with licensed revoked Does patient have a court date: Yes Court Date: 05/01/15 Is patient on probation?: No  Psychosis Hallucinations: Auditory (See assessment note) Delusions: None noted  Mental Status Report Appearance/Hygiene: Unremarkable Eye Contact: Fair Motor Activity: Unremarkable Speech: Logical/coherent Level of Consciousness: Alert Mood: Depressed, Guilty  Affect: Depressed Anxiety Level: Minimal Thought Processes: Coherent, Relevant Judgement: Partial Orientation: Person, Place, Time, Situation, Appropriate for developmental age Obsessive  Compulsive Thoughts/Behaviors: None  Cognitive Functioning Concentration: Fair Memory: Recent Intact, Remote Intact IQ: Average Insight: Fair Impulse Control: Fair Appetite: Poor Weight Loss: 0 Weight Gain: 0 Sleep: Decreased Total Hours of Sleep: 6 Vegetative Symptoms: None  ADLScreening [facility] Assessment Services) Patient's cognitive ability adequate to safely complete daily activities?: Yes Patient able to express need for assistance with ADLs?: Yes Independently performs ADLs?: Yes (appropriate for developmental age)  Prior Inpatient Therapy Prior Inpatient Therapy: Yes Prior Therapy Dates: 03/04/15-02//02/17 Prior Therapy Facilty/Provider(s): [facility] Reason for Treatment: MDD, substance abuse  Prior Outpatient Therapy Prior Outpatient Therapy: Yes Prior Therapy Dates: 03/2015 Prior Therapy Facilty/Provider(s): [facility] Reason for Treatment: MDD, substance abuse Does patient have an ACCT team?: No Does patient have Intensive In-House Services?  : No Does patient have [facility] services? : No Does patient have P4CC services?: No  ADL Screening (condition at time of admission) Patient's cognitive ability adequate to safely complete daily activities?: Yes Is the patient deaf or have difficulty hearing?: No Does the patient have difficulty seeing, even when wearing glasses/contacts?: No Does the patient have difficulty concentrating, remembering, or making decisions?: No Patient able to express need for assistance with ADLs?: Yes Does the patient have difficulty dressing or bathing?: No Independently performs ADLs?: Yes (appropriate for developmental age) Does the patient have difficulty walking or climbing stairs?: No Weakness of Legs: None Weakness of Arms/Hands: None  Home Assistive Devices/Equipment Home Assistive Devices/Equipment: None    Abuse/Neglect Assessment (Assessment to be complete while patient is alone) Physical Abuse: Denies Verbal Abuse:  Denies Sexual Abuse: Denies Exploitation of patient/patient's resources: Denies Self-Neglect: Denies     Merchant navy officer (For Healthcare) Does patient have an advance directive?: No Would patient like information on creating an advanced directive?: No - patient declined information    Additional Information 1:1 In Past 12 Months?: No CIRT Risk: No Elopement Risk: No Does patient have medical clearance?: Yes     Disposition: Binnie Rail, AC at [facility], says a bed is not available tonight but will be available in the morning after scheduled discharges. Gave clinical report to Alberteen Sam, NP who said Pt meets criteria for inpatient psychiatric treatment and accepted Pt to the service of Dr. Geoffery Lyons, room 405-1. Notified [employer] [facility], PA-C and Hervey Ard, RN of acceptance.  Disposition Initial Assessment Completed for this Encounter: Yes Disposition of Patient: Other dispositions Type of inpatient treatment program: Adult Other disposition(s): Other (Comment)   Pamalee Leyden, [facility], [facility], [facility] Triage Specialist [phone]   Patsy Baltimore, Harlin Rain 04/07/2015 8:00 PM

## 2015-04-07 NOTE — ED Notes (Signed)
Pt talking with TTS at this time.  

## 2015-04-07 NOTE — ED Notes (Signed)
Called staffing for sitter again, sitter will not be available until 11pm.

## 2015-04-07 NOTE — ED Notes (Signed)
Pt requesting to have his clothes back and requesting to leave. Instructed patient to please wait until we hear [facility] from [facility].

## 2015-04-08 DIAGNOSIS — F333 Major depressive disorder, recurrent, severe with psychotic symptoms: Principal | ICD-10-CM

## 2015-04-08 MED ORDER — THIAMINE HCL 100 MG/ML IJ SOLN: 100.0000 mg | Freq: Once | INTRAMUSCULAR | Status: DC

## 2015-04-08 MED ORDER — CITALOPRAM HYDROBROMIDE 10 MG PO TABS
10.0000 mg | ORAL_TABLET | Freq: Every day | ORAL | Status: DC
  Administered 2015-04-08 – 2015-04-10 (×3): 10 mg via ORAL
  Filled 2015-04-08 (×6): qty 1

## 2015-04-08 MED ORDER — LORAZEPAM 1 MG PO TABS
1.0000 mg | ORAL_TABLET | Freq: Four times a day (QID) | ORAL | Status: DC | PRN
  Administered 2015-04-08: 1 mg via ORAL
  Filled 2015-04-08: qty 1

## 2015-04-08 MED ORDER — INFLUENZA VAC SPLIT QUAD 0.5 ML IM SUSY: 0.5000 mL | PREFILLED_SYRINGE | INTRAMUSCULAR | Status: DC

## 2015-04-08 MED ORDER — PNEUMOCOCCAL VAC POLYVALENT 25 MCG/0.5ML IJ INJ: 0.5000 mL | INJECTION | INTRAMUSCULAR | Status: DC

## 2015-04-08 MED ORDER — ONDANSETRON 4 MG PO TBDP: 4.0000 mg | ORAL_TABLET | Freq: Four times a day (QID) | ORAL | Status: AC | PRN

## 2015-04-08 MED ORDER — VITAMIN B-1 100 MG PO TABS
100.0000 mg | ORAL_TABLET | Freq: Every day | ORAL | Status: DC
  Administered 2015-04-09 – 2015-04-15 (×7): 100 mg via ORAL
  Filled 2015-04-08 (×9): qty 1

## 2015-04-08 MED ORDER — LOPERAMIDE HCL 2 MG PO CAPS: 2.0000 mg | ORAL_CAPSULE | ORAL | Status: AC | PRN

## 2015-04-08 MED ORDER — ADULT MULTIVITAMIN W/MINERALS CH
1.0000 | ORAL_TABLET | Freq: Every day | ORAL | Status: DC
  Administered 2015-04-08 – 2015-04-15 (×8): 1 via ORAL
  Filled 2015-04-08 (×11): qty 1

## 2015-04-08 MED ORDER — HYDROXYZINE HCL 25 MG PO TABS
25.0000 mg | ORAL_TABLET | Freq: Four times a day (QID) | ORAL | Status: AC | PRN
  Administered 2015-04-08 – 2015-04-10 (×3): 25 mg via ORAL
  Filled 2015-04-08 (×3): qty 1

## 2015-04-08 NOTE — Progress Notes (Signed)
Adult Psychoeducational Group Note  Date:  04/08/2015 Time:  9:19 PM  Group Topic/Focus:  Wrap-Up Group:   The focus of this group is to help patients review their daily goal of treatment and discuss progress on daily workbooks.  Participation Level:  Active  Participation Quality:  Appropriate  Affect:  Appropriate  Cognitive:  Alert  Insight: Appropriate  Engagement in Group:  Engaged  Modes of Intervention:  Discussion  Additional Comments: Patient stated, "I've had an all right day. It's just been long". Patient goal for today was too try to get his mind clear.     Verba Ainley L Renada Cronin 04/08/2015, 9:19 PM

## 2015-04-08 NOTE — Plan of Care (Signed)
Problem: Alteration in mood & ability to function due to Goal: LTG-Pt reports reduction in suicidal thoughts (Patient reports reduction in suicidal thoughts and is able to verbalize a safety plan for whenever patient is feeling suicidal)  Outcome: Progressing Pt denies SI at this time     

## 2015-04-08 NOTE — Progress Notes (Signed)
Pt agitated, pacing and appears to be responding to internal stimuli. Pt easily agitated and irritable. Pt given multivitamin and Ativan for withdrawal symptoms as ordered. Pt refused thiamine injection, stating that he doesn't like shots.

## 2015-04-08 NOTE — Progress Notes (Signed)
D: Pt irritable on approach this morning. Pt stated that he's been having racing thoughts.  Pt appeared to be minimizing his symptoms during shift assessment. Pt noted to be easily agitated when asked about endorsing AVH. Pt denies AVH to writer this morning. Pt refused to take scheduled vaccines due to not feeling well this morning and feeling weak. Pt agreed to taking vaccines tomorrow. Pt encouraged to increase fluid intake. Fluid offered to pt. Pt appears withdrawn and has been isolative this morning. Pt appears disheveled with poor hygiene.  A: Medications administered as ordered per MD. Orders reviewed by writer. Pt encouraged to attend groups. Pt encouraged to perform ADL's. 15 minute checks performed for safety.  R: Pt safety maintained. Pt has not comply with tx.

## 2015-04-08 NOTE — Progress Notes (Signed)
Recreation Therapy Notes  Date: 03.06.2017 Time: 9:30am Location: [address] Group Room   Group Topic: Stress Management  Goal Area(s) Addresses:  Patient will actively participate in stress management techniques presented during session.   Behavioral Response: Did not attend.   Jenasis Straley L Jaslen Adcox, LRT/CTRS        Marley Pakula L 04/08/2015 2:28 PM 

## 2015-04-08 NOTE — [facility] Suicide Risk Assessment (Signed)
[facility]   Nursing information obtained from:  Patient Demographic factors:  Male, Caucasian Current Mental Status:  See below  Loss Factors: does not identify any specific triggers or stressors  ]Historical Factors:  Prior psychiatric admission in January 2017, history of substance abuse  Risk Reduction Factors:  Employed, Living with another person, especially a relative  Total Time spent with patient: 45 minutes Principal Problem:  MDD  Diagnosis:   Patient Active Problem List   Diagnosis Date Noted  . MDD (major depressive disorder), recurrent, severe, with psychosis (HCC) [F33.3] 04/07/2015  . MDD (major depressive disorder), single episode, moderate (HCC) [F32.1] 03/05/2015  . 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 Alcohol use disorder, mild, abuse [F10.10] 03/05/2015  . 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 Tobacco use disorder [F17.200] 03/05/2015  . 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  World Science writerHealth Organization [facility](WHO). Score between 0-7:  no or low risk or alcohol related problems. Score between 8-15:  moderate risk of alcohol related problems. Score between 16-19:  high risk of alcohol related problems. Score 20 or above:  warrants further diagnostic evaluation for alcohol dependence and treatment.   CLINICAL FACTORS:  25 year old male, reports worsening depression, anxiety, as well as vague auditory hallucinations. Recent admission in January. History of opiate , bzd abuse, but states he has not been using any drugs in several days, and admission BAL and UDS negative.      Psychiatric Specialty Exam: ROS  Blood  pressure 103/57, pulse 96, temperature 98.4 F (36.9 C), temperature source Oral, resp. rate 20, height 5' 5.5" (1.664 m), weight 143 lb (64.864 kg).Body mass index is 23.43 kg/(m^2).  See admit note MSE  COGNITIVE FEATURES THAT CONTRIBUTE TO RISK:  Closed-mindedness and Loss of executive function    SUICIDE RISK:   Moderate:  Frequent suicidal ideation with limited intensity, and duration, some specificity in terms of plans, no associated intent, good self-control, limited dysphoria/symptomatology, some risk factors present, and identifiable protective factors, including available and accessible social support.  PLAN OF CARE: Patient will be admitted to inpatient psychiatric unit for stabilization and safety. Will provide and encourage milieu participation. Provide medication management and maked adjustments as needed.  Will follow daily.    I certify that inpatient services furnished can reasonably be expected to improve the patient's condition.   Nehemiah MassedOBOS, FERNANDO, MD 04/08/2015, 1:47 PM

## 2015-04-08 NOTE — Progress Notes (Signed)
NUTRITION ASSESSMENT  Pt identified as at risk on the Malnutrition Screen Tool  INTERVENTION: 1. Educated patient on the importance of nutrition and encouraged intake of food and beverages. 2. Discussed weight goals. 3. Supplements: continue Ensure Enlive po BID, each supplement provides 350 kcal and 20 grams of protein   NUTRITION DIAGNOSIS: Unintentional weight loss related to sub-optimal intake as evidenced by pt report.   Goal: Pt to meet >/= 90% of their estimated nutrition needs.  Monitor:  PO intake  Assessment:  Pt seen for MST. Pt admitted for substance abuse, SI, and psychosis. Pt states decreased appetite and intakes the past few months with associated 17 lb weight loss in the past 4-5 months. This would indicate 11% wt loss in this time frame which is significant. Ensure Shiela Mayernlive has already been ordered BID and is appropriate. Continue to encourage good intakes during meal and snack times.   25 y.o. male  Height: Ht Readings from Last 1 Encounters:  04/07/15 5' 5.5" (1.664 m)    Weight: Wt Readings from Last 1 Encounters:  04/07/15 143 lb (64.864 kg)    Weight Hx: Wt Readings from Last 10 Encounters:  04/07/15 143 lb (64.864 kg)  04/07/15 158 lb (71.668 kg)  03/04/15 154 lb (69.854 kg)  03/04/15 154 lb (69.854 kg)  12/01/13 156 lb (70.761 kg)  10/09/13 127 lb (57.607 kg)  09/05/13 156 lb (70.761 kg)  05/10/13 165 lb (74.844 kg)  07/13/12 148 lb (67.132 kg) (39 %*, Z = -0.27)  07/11/12 152 lb (68.947 kg) (46 %*, Z = -0.10)   * Growth percentiles are based on CDC 2-20 Years data.    BMI:  Body mass index is 23.43 kg/(m^2). Pt meets criteria for normal weight based on current BMI.  Estimated Nutritional Needs: Kcal: 25-30 kcal/kg Protein: > 1 gram protein/kg Fluid: 1 ml/kcal  Diet Order: Diet regular Room service appropriate?: Yes; Fluid consistency:: Thin Pt is also offered choice of unit snacks mid-morning and mid-afternoon.  Pt is eating as  desired.   Lab results and medications reviewed.     Trenton GammonJessica Dannae Kato, RD, LDN Inpatient Clinical Dietitian Pager # [phone] After hours/weekend pager # [phone]

## 2015-04-08 NOTE — H&P (Addendum)
Psychiatric Admission Assessment Adult  Patient Identification: Michael Costa MRN:  [id number] Date of Evaluation:  04/08/2015 Chief Complaint: " Depression , Anxiety" Principal Diagnosis: MDD Diagnosis:   Patient Active Problem List   Diagnosis Date Noted  . MDD (major depressive disorder), recurrent, severe, with psychosis ([place]) [F33.3] 04/07/2015  . MDD (major depressive disorder), single episode, moderate ([place]) [F32.1] 03/05/2015  . Opioid use disorder, moderate, dependence ([place]) [F11.20] 03/05/2015  . Alcohol use disorder, mild, abuse [F10.10] 03/05/2015  . 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 Tobacco use disorder [F17.200] 03/05/2015  . 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  He was hospitalized in our unit in January ( 1/30- 2/2 /17 ) for substance abuse and depression.  He states " when I left I felt better", but reports again feeling more depressed, sad, as well as more anxious anxious, with  frequent panic attacks. He reports passive suicide thoughts, but denies any attempts .  He cannot identify any  trigger for his worsening depression. As per notes,his parent's separation had been a prior trigger, but states this is not  an issue now, because "they are getting along now". He states he has " slowed down a lot " insofar as drug abuse, and states he has not used any drugs in almost a week. Of note, admission UDS negative and BAL negative as well . Of note, patient also reports recent onset auditory hallucinations , which he describes as voice that he cannot make out. Denies command hallucinations.  States " it was getting so bad again that I knew I had to come back to the hospital " Patient had been prescribed Celexa on prior admission, states he  took if for about one week , but then stopped it because " I ran out". Denies side effects.     Associated Signs/Symptoms: Depression Symptoms:  depressed mood, anhedonia, recurrent thoughts of death, anxiety, panic attacks, loss of energy/fatigue, decreased sense of self esteem (Hypo) Manic Symptoms:   Does not endorse  Anxiety Symptoms:   Reports frequent panic attacks, denies agoraphobia.  Psychotic Symptoms:   States he has auditory hallucinations but " cannot make out what they say" PTSD Symptoms: Denies  Total Time spent with patient: 45 minutes  Past Psychiatric History:One prior psychiatric admission in January 2017. States he has  Been depressed for " about two months now", but states he has had prior episodes of depression , although not severe. Denies any prior history of psychosis. Describes history of anxiety symptoms to include sense of excessive anxiety , some panic attacks, but no clear agoraphobia. He has never attempted suicide, denies history of cutting, denies history of violence . Denies history of PTSD   Is the patient at risk to self? Yes.    Has the patient been a risk to self in the past 6 months? Yes.    Has the patient been a risk to self within the distant past? No.  Is the patient a risk to others? No.  Has the patient been a risk to others in the past 6 months? No.  Has the patient been a risk to others within the distant past? No.   Prior Inpatient Therapy:  one prior psychiatric admission earlier this year  Prior Outpatient Therapy:  [facility]   Alcohol Screening: 1. How  often do you have a drink containing alcohol?: 2 to 4 times a month 2. How many drinks containing alcohol do you have on a typical day when you are drinking?: 3 or 4 3. How often do you have six or more drinks on one occasion?: Less than monthly Preliminary Score: 2 4. How often during the last year have you found that you were not able to stop drinking once you had started?: Never 5.  How often during the last year have you failed to do what was normally expected from you becasue of drinking?: Never 6. How often during the last year have you needed a first drink in the morning to get yourself going after a heavy drinking session?: Never 7. How often during the last year have you had a feeling of guilt of remorse after drinking?: Never 8. How often during the last year have you been unable to remember what happened the night before because you had been drinking?: Never 9. Have you or someone else been injured as a result of your drinking?: Yes, but not in the last year 10. Has a relative or friend or a doctor or another health worker been concerned about your drinking or suggested you cut down?: No Alcohol Use Disorder Identification Test Final Score (AUDIT): 6 Brief Intervention: AUDIT score less than 7 or less-screening does not suggest unhealthy drinking-brief intervention not indicated Substance Abuse History in the last 12 months:  History of opiate dependence, states he has " slowed down", and last used one week ago. He has also abused cocaine and crystal methamphetamine in the past .  Consequences of Substance Abuse: DUI two years ago , denies any history of seizures  Previous Psychotropic Medications: Recent Celexa trial , which he states he stopped a few weeks ago. States " I think it was starting to help ", and does not remember having had any side effects Psychological Evaluations:  No  Past Medical History: Denies medical illnesses, NKDA   Past Medical History  Diagnosis Date  . Polysubstance abuse     opiates, benzos, marijuana   History reviewed. No pertinent past surgical history. Family History: parents alive , separated , one sister  Family History  Problem Relation Age of Onset  . Mental illness Neg Hx    Family Psychiatric  History:  Denies any family history of mental illness, denies any suicides in family, history of alcohol dependence in extended  family . Tobacco Screening: smokes 1 PPD Social History: single, no children, employed, partial high school, denies legal issues , lives with mother/grandmother History  Alcohol Use  . 3.0 oz/week  . 5 Cans of beer per week     History  Drug Use  . Yes  . Special: Marijuana, Hydrocodone, Oxycodone, Benzodiazepines    Comment: pain pills, opiates, benzos    Additional Social History:      Pain Medications: yes Prescriptions: reports abusing opiates (crusing and snorting them) Over the Counter: Denies History of alcohol / drug use?: Yes Longest period of sobriety (when/how long): 10 days Negative Consequences of Use: Work / Youth worker, Charity fundraiser relationships Withdrawal Symptoms: Irritability   Name of Substance 2: "2 to 3 of any pain med" (snorting)- opiates 2 - Age of First Use: 20 2 - Amount (size/oz): 2-3 of any pain med per pt 2 - Frequency: daily 2 - Duration: years 2 - Last Use / Amount: 04/04/15                Allergies:  Allergies  Allergen Reactions  . Adhesive [Tape]     sensitivity   Lab Results:  Results for orders placed or performed during the hospital encounter of 04/07/15 (from the past 48 hour(s))  Ethanol (ETOH)     Status: None   Collection Time: 04/07/15  4:49 PM  Result Value Ref Range   Alcohol, Ethyl (B) <5 <5 mg/dL    Comment:        LOWEST DETECTABLE LIMIT FOR SERUM ALCOHOL IS 5 mg/dL FOR MEDICAL PURPOSES ONLY   Comprehensive metabolic panel     Status: Abnormal   Collection Time: 04/07/15  4:49 PM  Result Value Ref Range   Sodium 139 135 - 145 mmol/L   Potassium 3.7 3.5 - 5.1 mmol/L   Chloride 102 101 - 111 mmol/L   CO2 24 22 - 32 mmol/L   Glucose, Bld 93 65 - 99 mg/dL   BUN 7 6 - 20 mg/dL   Creatinine, Ser 0.82 0.61 - 1.24 mg/dL   Calcium 9.6 8.9 - 10.3 mg/dL   Total Protein 7.5 6.5 - 8.1 g/dL   Albumin 4.7 3.5 - 5.0 g/dL   AST 17 15 - 41 U/L   ALT 13 (L) 17 - 63 U/L   Alkaline Phosphatase 50 38 - 126 U/L   Total Bilirubin  1.0 0.3 - 1.2 mg/dL   GFR calc non Af Amer >60 >60 mL/min   GFR calc Af Amer >60 >60 mL/min    Comment: (NOTE) The eGFR has been calculated using the CKD EPI equation. This calculation has not been validated in all clinical situations. eGFR's persistently <60 mL/min signify possible Chronic Kidney Disease.    Anion gap 13 5 - 15  CBC     Status: Abnormal   Collection Time: 04/07/15  4:49 PM  Result Value Ref Range   WBC 10.7 (H) 4.0 - 10.5 K/uL   RBC 4.91 4.22 - 5.81 MIL/uL   Hemoglobin 15.1 13.0 - 17.0 g/dL   HCT 42.9 39.0 - 52.0 %   MCV 87.4 78.0 - 100.0 fL   MCH 30.8 26.0 - 34.0 pg   MCHC 35.2 30.0 - 36.0 g/dL   RDW 12.6 11.5 - 15.5 %   Platelets 276 150 - 400 K/uL  Urine rapid drug screen (hosp performed) (Not at [facility])     Status: None   Collection Time: 04/07/15  4:49 PM  Result Value Ref Range   Opiates NONE DETECTED NONE DETECTED   Cocaine NONE DETECTED NONE DETECTED   Benzodiazepines NONE DETECTED NONE DETECTED   Amphetamines NONE DETECTED NONE DETECTED   Tetrahydrocannabinol NONE DETECTED NONE DETECTED   Barbiturates NONE DETECTED NONE DETECTED    Comment:        DRUG SCREEN FOR MEDICAL PURPOSES ONLY.  IF CONFIRMATION IS NEEDED FOR ANY PURPOSE, NOTIFY LAB WITHIN 5 DAYS.        LOWEST DETECTABLE LIMITS FOR URINE DRUG SCREEN Drug Class       Cutoff (ng/mL) Amphetamine      1000 Barbiturate      200 Benzodiazepine   734 Tricyclics       037 Opiates          300 Cocaine          300 THC              50     Blood Alcohol level:  Lab Results  Component Value Date   ETH <5 04/07/2015   ETH <5 03/04/2015  Metabolic Disorder Labs:  No results found for: HGBA1C, MPG No results found for: PROLACTIN No results found for: CHOL, TRIG, HDL, CHOLHDL, VLDL, LDLCALC  Current Medications: Current Facility-Administered Medications  Medication Dose Route Frequency Provider Last Rate Last Dose  . acetaminophen (TYLENOL) tablet 650 mg  650 mg Oral Q6H PRN Lurena Nida, NP      . feeding supplement (ENSURE ENLIVE) (ENSURE ENLIVE) liquid 237 mL  237 mL Oral BID BM Nicholaus Bloom, MD   237 mL at 04/08/15 1000  . Influenza vac split quadrivalent PF (FLUARIX) injection 0.5 mL  0.5 mL Intramuscular Tomorrow-1000 Nicholaus Bloom, MD   0.5 mL at 04/08/15 1000  . nicotine (NICODERM CQ - dosed in mg/24 hours) patch 21 mg  21 mg Transdermal Daily Nicholaus Bloom, MD   21 mg at 04/08/15 0800  . OLANZapine (ZYPREXA) tablet 5 mg  5 mg Oral QHS Lurena Nida, NP   5 mg at 04/07/15 2301  . pneumococcal 23 valent vaccine (PNU-IMMUNE) injection 0.5 mL  0.5 mL Intramuscular Tomorrow-1000 Nicholaus Bloom, MD   0.5 mL at 04/08/15 1000  . traZODone (DESYREL) tablet 50 mg  50 mg Oral QHS PRN Lurena Nida, NP   50 mg at 04/07/15 2301   PTA Medications: Prescriptions prior to admission  Medication Sig Dispense Refill Last Dose  . citalopram (CELEXA) 10 MG tablet Take 1 tablet (10 mg total) by mouth daily. For depression 30 tablet 0   . traZODone (DESYREL) 50 MG tablet Take 1 tablet (50 mg total) by mouth at bedtime. For sleep 30 tablet 0     Musculoskeletal: Strength & Muscle Tone: within normal limits no current tremors, no diaphoresis  Gait & Station: normal Patient leans: N/A  Psychiatric Specialty Exam: Physical Exam  Review of Systems  Constitutional: Negative.   HENT: Negative.   Eyes: Negative.   Respiratory: Positive for cough. Negative for shortness of breath.   Cardiovascular: Negative.   Gastrointestinal: Negative.   Genitourinary: Negative.   Musculoskeletal: Negative.   Skin: Negative.   Neurological: Negative for seizures.  Endo/Heme/Allergies: Negative.   Psychiatric/Behavioral: Positive for depression, suicidal ideas, hallucinations and substance abuse.  All other systems reviewed and are negative.   Blood pressure 103/57, pulse 96, temperature 98.4 F (36.9 C), temperature source Oral, resp. rate 20, height 5' 5.5" (1.664 m), weight 143 lb (64.864  kg).Body mass index is 23.43 kg/(m^2).  General Appearance: Fairly Groomed  Engineer, water::  Good  Speech:  Normal Rate  Volume:  Decreased  Mood:  depressed, anxious   Affect:  Constricted and vaguely irritable  Thought Process:  Linear  Orientation:  Other:  fully alert and attentive   Thought Content:  (+) auditory hallucinations, no delusions expressed, not currently internally preoccupied   Suicidal Thoughts:  No denies any suicidal plan or intention at this time and contracts for safety   Homicidal Thoughts:  No denies any homicidal or violent ideations   Memory:  recent and remote grossly intact   Judgement:  Fair  Insight:  Fair  Psychomotor Activity:  Decreased  Concentration:  Good  Recall:  Good  Fund of Knowledge:Good  Language: Good  Akathisia:  Negative  Handed:  Left  AIMS (if indicated):     Assets:  Desire for Improvement Resilience  ADL's:  Intact  Cognition: WNL  Sleep:  Number of Hours: 5.75     Treatment Plan Summary: Daily contact with patient to assess and evaluate  symptoms and progress in treatment, Medication management, Plan inpatient admission and medications as below   Observation Level/Precautions:  15 minute checks  Laboratory:  as needed - will order HgbA1C, Lipid Panel, and will follow up on recent low TSH- repeat TSH, obtain FT3, FT4   Psychotherapy:  Milieu, support   Medications:  States Celexa was well tolerated and helping Will restart CELEXA 10 mgrs QDAY  Continue ZYPREXA 5 mgrs QHS  Due to history of BZD and Alcohol Abuse, will add Ativan PRNs in case he might develop symptoms of WDL .   Consultations:  As needed   Discharge Concerns: -    Estimated LOS: 5 days   Other:     I certify that inpatient services furnished can reasonably be expected to improve the patient's condition.    Neita Garnet, MD 3/6/20171:48 PM

## 2015-04-08 NOTE — [facility] Group Notes (Signed)
[facility]  04/08/2015 8:45 AM  Pt did not attend, declined invitation.   Chad CordialLauren Carter, LCSWA 04/08/2015 4:07 PM

## 2015-04-08 NOTE — [facility] Group Notes (Signed)
[facility] LCSW Group Therapy Note  04/08/2015 1:15pm  Type of Therapy and Topic: Group Therapy: Holding onto Grudges   Pt did not attend, declined invitation.   Chad CordialLauren Carter, LCSWA 04/08/2015 4:08 PM

## 2015-04-08 NOTE — Progress Notes (Signed)
D: Pt denies SI/HI/AVH. Pt is pleasant and cooperative. Pt stated he was doing better due to his medications working.   A: Pt was offered support and encouragement. Pt was given scheduled medications. Pt was encourage to attend groups. Q 15 minute checks were done for safety.   R:Pt attends groups and interacts well with peers and staff. Pt is taking medication. Pt has no complaints.Pt receptive to treatment and safety maintained on unit.

## 2015-04-08 NOTE — Progress Notes (Signed)
Pt is a 25 year [facility], passive SI and psychosis.  He reports he is here "for my anxiety and stress, not feeling myself."  He reports having hallucinations "like seeing lights, feeling things like something is on my chest, sometimes hearing things, a voice, I can't make out what it says."  Pt reports he has been having hallucinations for the past month.  He reports he has not been taking his prescribed medication for the past "week or two."  [facility]  During admission assessment, pt denies SI/HI, denies hallucinations at the time, denies pain, denies withdrawal symptoms except for anxiety.  He appears to be responding to internal stimuli, looking around the room throughout assessment as if he is seeing something.  He is also slow to answer questions at times.  Reports 17 pound weight loss within the past 4-5 months.  He reports he drinks alcohol 2-4 times a month and has 3 or 4 drinks each time.  He reports using opiates daily, stating he takes 2-3 of any pain medication.  He reports he snorts the medication and that the last time he used was 3 days ago.  Reports smoking 1 pack of cigarettes daily.  Pt denies history of physical, sexual, emotional abuse.  He identifies coping skills of listening to music, exercising, and talking to his father.  Pt's reported goals are "to feel better about myself" and to "start caring more."    Introduced self to pt.  Admission process and paperwork completed with pt.  Non-invasive body assessment completed.  Pt has scar on right upper arm, erythema to middle of left arm, multiple superficial scratches on both hands, 2 scratches on right side of upper neck, superficial abrasions to both lower legs, bruise to left ankle, tattoo on upper back.  He reports the scratches are from "briars."  Belongings searched for contraband and items not allowed on unit are in locker 8.  Pt oriented to unit.  Pt provided with  meal and beverage.  Medication administered per order.  PRN medication administered for sleep.  Ensure ordered, nicotine patch ordered, flu vaccine ordered, pneumonia vaccine ordered.  Actively listened to pt and offered support and encouragement.    Pt is cooperative with admission process.  He verbally contracts for safety and reports that he will inform staff of needs and concerns.  Pt is safe on the unit.  Will continue to monitor and assess.

## 2015-04-08 NOTE — [facility] Counselor (Signed)
Adult Comprehensive Assessment  Patient ID: Michael Costa, male DOB: 1990-02-09, 25 y.o. MRN: [id number]  Information Source: Information source: Patient  Current Stressors:  Educational / Learning stressors: 10th grade Employment / Job issues: unemployed due to drug use.  Physical health (include injuries & life threatening diseases): none identified  Bereavement / Loss: none identified.   Living/Environment/Situation:  Living Arrangements: Parent Living conditions (as described by patient or guardian): living with mom, grandma, sister, and neice.  How long has patient lived in current situation?: 6 months.  What is atmosphere in current home: Supportive  Family History:  Marital status: Single Are you sexually active?: No What is your sexual orientation?: heterosexual  Has your sexual activity been affected by drugs, alcohol, medication, or emotional stress?: no Does patient have children?: No  Childhood History:  By whom was/is the patient raised?: Mother/father and step-parent Additional childhood history information: when pt was 5, biological father died of heart attack. mom was drug addict until pt was 18mo old. grandmother helped raise him. Description of patient's relationship with caregiver when they were a child: close to mother and grandmother Patient's description of current relationship with people who raised him/her: strained with mother and grandmother. close to stepdad who is separated from mother.  How were you disciplined when you got in trouble as a child/adolescent?: n/a  Does patient have siblings?: Yes Number of Siblings: 1 Description of patient's current relationship with siblings: 23 year old sister and 23 year old neice. "I love her to death."  Did patient suffer any verbal/emotional/physical/sexual abuse as a child?: No Did patient suffer from severe childhood neglect?: No Has patient ever been sexually abused/assaulted/raped as an  adolescent or adult?: No Was the patient ever a victim of a crime or a disaster?: No Witnessed domestic violence?: No Has patient been effected by domestic violence as an adult?: No  Education:  Highest grade of school patient has completed: 10th grade; quit school. "I stayed at home about a year and then I got a job."  Currently a Consulting civil engineerstudent?: No Learning disability?: No  Employment/Work Situation:  Employment situation: Unemployed Patient's job has been impacted by current illness: Yes Describe how patient's job has been impacted: "drugs got in the way."  What is the longest time patient has a held a job?: [employer] work Where was the patient employed at that time?: 6-8 months  Has patient ever been in the [employer] and Companymilitary?: No Has patient ever served in combat?: No Did You Receive Any Psychiatric Treatment/Services While in Equities traderthe Military?: No Are There Guns or Other Weapons in Your Home?: Yes Types of Guns/Weapons: n/a  Are These ComptrollerWeapons Safely Secured?: (n/a )  Financial Resources:  Financial resources: Support from parents / caregiver, No income Does patient have a Lawyerrepresentative payee or guardian?: No  Alcohol/Substance Abuse:  What has been your use of drugs/alcohol within the last 12 months?: pain pills daily "for years now" but has been using less recently If attempted suicide, did drugs/alcohol play a role in this?: No Alcohol/Substance Abuse Treatment Hx: Denies past history If yes, describe treatment: n/a  Has alcohol/substance abuse ever caused legal problems?: Yes (Feb 9 for driving with license revoked. )  Social Support System:  Patient's Community Support System: Production assistant, radioGood Describe Community Support System: good friends in community Type of faith/religion: christian How does patient's faith help to cope with current illness?: "I don't go to church."   Leisure/Recreation:  Leisure and Hobbies: riding 4 wheelers; playing basketball.  Strengths/Needs:  What things does the patient do well?: outdoors stuff In what areas does patient struggle / problems for patient: stress and anxiety; chronic pain pill and xanax abuse.   Discharge Plan:  Does patient have access to transportation?: Yes Will patient be returning to same living situation after discharge?: Yes Currently receiving community mental health services: Yes- [facility] in [place] If no, would patient like referral for services when discharged?: N/A Does patient have financial barriers related to discharge medications?: No (no income; no insurance)  Summary/Recommendations: Patient is a 25 year old male with a diagnosis of Substance-Induced Mood Disorder. Pt presented to the hospital with increased anxiety, thoughts of suicide, and hallucinations. Pt reports primary trigger(s) for admission was substance abuse and unregulated depression. Patient will benefit from crisis stabilization, medication evaluation, group therapy and psycho education in addition to case management for discharge planning. At discharge it is recommended that Pt remain compliant with established discharge plan and continued treatment.   [name], LCSWA Clinical Social Work [phone]

## 2015-04-08 NOTE — Tx Team (Signed)
Interdisciplinary Treatment Plan Update (Adult) Date: 04/08/2015   Date: 04/08/2015 3:56 PM  Progress in Treatment:  Attending groups: No Participating in groups: No Taking medication as prescribed: Yes  Tolerating medication: Yes  Family/Significant othe contact made: No, CSW attempting to make contact with mother Patient understands diagnosis: Yes AEB seeking help with depression and substance abuse Discussing patient identified problems/goals with staff: Yes  Medical problems stabilized or resolved: Yes  Denies suicidal/homicidal ideation: No Pt recently admitted with SI Patient has not harmed self or Others: Yes   New problem(s) identified: None identified at this time.   Discharge Plan or Barriers: Pt will return to mother's house and follow-up with [facility]  Additional comments:  Patient and CSW reviewed pt's identified goals and treatment plan. Patient verbalized understanding and agreed to treatment plan. CSW reviewed [facility] "Discharge Process and Patient Involvement" Form. Pt verbalized understanding of information provided and signed form.   Reason for Continuation of Hospitalization:  Depression Medication stabilization Suicidal ideation Withdrawal symptoms Psychotic  Estimated length of stay: 3-5 days  Review of initial/current patient goals per problem list:   1.  Goal(s): Patient will participate in aftercare plan  Met:  Yes  Target date: 3-5 days from date of admission   As evidenced by: Patient will participate within aftercare plan AEB aftercare provider and housing plan at discharge being identified.   04/08/15: Pt will return to mother's house and follow-up with [facility] in [place]  2.  Goal (s): Patient will exhibit decreased depressive symptoms and suicidal ideations.  Met:  No  Target date: 3-5 days from date of admission   As evidenced by: Patient will utilize self rating of depression at 3 or below and demonstrate decreased  signs of depression or be deemed stable for discharge by MD. 04/08/15: Pt was admitted with symptoms of depression, rating 10/10. Pt continues to present with flat affect and depressive symptoms.  Pt will demonstrate decreased symptoms of depression and rate depression at 3/10 or lower prior to discharge.  4.  Goal(s): Patient will demonstrate decreased signs of withdrawal due to substance abuse  Met:  No  Target date: 3-5 days from date of admission   As evidenced by: Patient will produce a CIWA/COWS score of 0, have stable vitals signs, and no symptoms of withdrawal  04/08/15: Pt has COWS score of 3 and endorses anxiety and restlessness as symptoms of withdrawal. 5.  Goal(s): Patient will demonstrate decreased signs of psychosis  . Met:  No . Target date: 3-5 days from date of admission  . As evidenced by: Patient will demonstrate decreased frequency of AVH or return to baseline function    -04/08/15: Pt recently admitted with    AVH; will continue to monitor  Attendees:  Patient:    Family:    Physician: Dr. Parke Poisson, MD  04/08/2015 3:56 PM  Nursing: Lars Pinks, RN Case manager  04/08/2015 3:56 PM  Clinical Social Worker Peri Maris, [place] 04/08/2015 3:56 PM  Other: Tilden Fossa, [place] 04/08/2015 3:56 PM  Clinical:  Darrol Angel, RN; Mayra Neer, RN 04/08/2015 3:56 PM  Other: , RN Charge Nurse 04/08/2015 3:56 PM  Other: Hilda Lias, [place], [place] Work [phone]

## 2015-04-09 LAB — T4, FREE: FREE T4: 0.92 ng/dL (ref 0.61–1.12)

## 2015-04-09 LAB — LIPID PANEL
CHOL/HDL RATIO: 4.2 ratio
CHOLESTEROL: 130 mg/dL (ref 0–200)
HDL: 31 mg/dL — AB (ref >40)
LDL Cholesterol: 85 mg/dL (ref 0–99)
Triglycerides: 72 mg/dL (ref <150)
VLDL: 14 mg/dL (ref 0–40)

## 2015-04-09 LAB — TSH: TSH: 0.382 u[IU]/mL (ref 0.350–4.500)

## 2015-04-09 MED ORDER — OLANZAPINE 10 MG PO TABS
10.0000 mg | ORAL_TABLET | Freq: Every day | ORAL | Status: DC
  Administered 2015-04-09: 10 mg via ORAL
  Filled 2015-04-09 (×3): qty 1

## 2015-04-09 MED ORDER — LORAZEPAM 1 MG PO TABS
1.0000 mg | ORAL_TABLET | Freq: Four times a day (QID) | ORAL | Status: DC | PRN
  Administered 2015-04-09 – 2015-04-15 (×8): 1 mg via ORAL
  Filled 2015-04-09 (×8): qty 1

## 2015-04-09 NOTE — Progress Notes (Signed)
[facility] MD Progress Note  04/09/2015 3:35 PM Michael Costa  MRN:  [id number] Subjective:   Patient reports he feels vaguely agitated . He describes hallucinations. He has difficulty describing what voices say, but denies command hallucinations. States " just friends talking to me ".  Objective : I have discussed case with treatment team and have met with patient . As discussed with Nursing staff, patient has presented internally preoccupied, and has reported hallucinations. He at times appears vaguely agitated, pacing , but is redirectable, and has not exhibited any disruptive behaviors on unit . Limited group , milieu participation. Patient states he is still depressed, but less so than on admission. Of note he denies any suicidal ideations and contracts for safety on unit. He  has been experiencing auditory hallucinations since January, but does state he feels they are worsening, in spite of decrease in substance use. I inquired again about any potential hallucinogen use, or use of K2, Spice, or Bath Salts, but patient denies .  Although does not appear internally preoccupied at this time, he presents with inappropriate affect, and at times giggles or laughs for no known reason. He does state that he is " bothered " by his hallucinations. He also presents medication seeking, wanting to be prescribed ritalin or similar - I reviewed the rationale for avoiding this type of medication at this time, and he expressed understanding. Labs - TSH low normal, T4 WNL, Lipid panel remarkable for decreased HDL   Principal Problem:  MDD,with psychotic features , consider also substance induced psychosis  Diagnosis:   Patient Active Problem List   Diagnosis Date Noted  . MDD (major depressive disorder), recurrent, severe, with psychosis ([place]) [F33.3] 04/07/2015  . MDD (major depressive disorder), single episode, moderate ([place]) [F32.1] 03/05/2015  . Opioid use disorder, moderate, dependence ([place]) [F11.20] 03/05/2015   . Alcohol use disorder, mild, abuse [F10.10] 03/05/2015  . 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 Tobacco use disorder [F17.200] 03/05/2015  . 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 Polysubstance abuse     opiates, benzos, marijuana   History reviewed. No pertinent past surgical history. Family History:  Family History  Problem Relation Age of Onset  . Mental illness Neg Hx     Social History:  History  Alcohol Use  . 3.0 oz/week  . 5 Cans of beer per week     History  Drug Use  . Yes  . Special: Marijuana, Hydrocodone, Oxycodone, Benzodiazepines    Comment: pain pills, opiates, benzos    Social History   Social History  . Marital Status: Single    Spouse Name: N/A  . Number of Children: N/A  . Years of Education: N/A   Social History Main Topics  . Smoking status: Current Every Day Smoker -- 2.00 packs/day    Types: Cigarettes  . Smokeless tobacco: None  . Alcohol Use: 3.0 oz/week    5 Cans of beer per week  . Drug Use: Yes    Special: Marijuana, Hydrocodone, Oxycodone, Benzodiazepines     Comment: pain pills, opiates, benzos  . Sexual Activity: Not Currently    Birth Control/ Protection: None   Other Topics Concern  . None   Social History Narrative   Additional Social History:    Pain Medications: yes Prescriptions: reports abusing opiates (crusing and  snorting them) Over the Counter: Denies History of alcohol / drug use?: Yes Longest period of sobriety (when/how long): 10 days Negative Consequences of Use: Work / Youth worker, Personal relationships Withdrawal Symptoms: Irritability   Name of Substance 2: "2 to 3 of any pain med" (snorting)- opiates 2 - Age of First Use: 20 2 - Amount (size/oz): 2-3 of any pain med per pt 2 - Frequency:  daily 2 - Duration: years 2 - Last Use / Amount: 04/04/15  Sleep: Good  Appetite:  Fair  Current Medications: Current Facility-Administered Medications  Medication Dose Route Frequency Provider Last Rate Last Dose  . acetaminophen (TYLENOL) tablet 650 mg  650 mg Oral Q6H PRN Lurena Nida, NP      . citalopram (CELEXA) tablet 10 mg  10 mg Oral Daily Jenne Campus, MD   10 mg at 04/09/15 0759  . feeding supplement (ENSURE ENLIVE) (ENSURE ENLIVE) liquid 237 mL  237 mL Oral BID BM Nicholaus Bloom, MD   237 mL at 04/08/15 1400  . hydrOXYzine (ATARAX/VISTARIL) tablet 25 mg  25 mg Oral Q6H PRN Jenne Campus, MD   25 mg at 04/09/15 1356  . Influenza vac split quadrivalent PF (FLUARIX) injection 0.5 mL  0.5 mL Intramuscular Tomorrow-1000 Nicholaus Bloom, MD   0.5 mL at 04/08/15 1000  . loperamide (IMODIUM) capsule 2-4 mg  2-4 mg Oral PRN Jenne Campus, MD      . LORazepam (ATIVAN) tablet 1 mg  1 mg Oral Q6H PRN Jenne Campus, MD   1 mg at 04/08/15 1809  . multivitamin with minerals tablet 1 tablet  1 tablet Oral Daily Jenne Campus, MD   1 tablet at 04/09/15 0759  . nicotine (NICODERM CQ - dosed in mg/24 hours) patch 21 mg  21 mg Transdermal Daily Nicholaus Bloom, MD   21 mg at 04/09/15 0759  . OLANZapine (ZYPREXA) tablet 10 mg  10 mg Oral QHS Myer Peer Leisl Spurrier, MD      . ondansetron (ZOFRAN-ODT) disintegrating tablet 4 mg  4 mg Oral Q6H PRN Myer Peer Demarquez Ciolek, MD      . pneumococcal 23 valent vaccine (PNU-IMMUNE) injection 0.5 mL  0.5 mL Intramuscular Tomorrow-1000 Nicholaus Bloom, MD   0.5 mL at 04/08/15 1000  . thiamine (B-1) injection 100 mg  100 mg Intramuscular Once Jenne Campus, MD   100 mg at 04/08/15 1810  . thiamine (VITAMIN B-1) tablet 100 mg  100 mg Oral Daily Jenne Campus, MD   100 mg at 04/09/15 0759  . traZODone (DESYREL) tablet 50 mg  50 mg Oral QHS PRN Lurena Nida, NP   50 mg at 04/08/15 2106    Lab Results:  Results for orders placed or performed during the  hospital encounter of 04/07/15 (from the past 48 hour(s))  TSH     Status: None   Collection Time: 04/09/15  6:15 AM  Result Value Ref Range   TSH 0.382 0.350 - 4.500 uIU/mL    Comment: Performed at [facility]  T4, free     Status: None   Collection Time: 04/09/15  6:15 AM  Result Value Ref Range   Free T4 0.92 0.61 - 1.12 ng/dL    Comment: Performed at [facility]  Lipid panel     Status: Abnormal   Collection Time: 04/09/15  6:15 AM  Result Value Ref Range   Cholesterol 130 0 - 200 mg/dL   Triglycerides  72 <150 mg/dL   HDL 31 (L) >40 mg/dL   Total CHOL/HDL Ratio 4.2 RATIO   VLDL 14 0 - 40 mg/dL   LDL Cholesterol 85 0 - 99 mg/dL    Comment:        Total Cholesterol/HDL:CHD Risk Coronary Heart Disease Risk Table                     Men   Women  1/2 Average Risk   3.4   3.3  Average Risk       5.0   4.4  2 X Average Risk   9.6   7.1  3 X Average Risk  23.4   11.0        Use the calculated Patient Ratio above and the CHD Risk Table to determine the patient's CHD Risk.        ATP III CLASSIFICATION (LDL):  <100     mg/dL   Optimal  100-129  mg/dL   Near or Above                    Optimal  130-159  mg/dL   Borderline  160-189  mg/dL   High  >190     mg/dL   Very High Performed at [facility]     Blood Alcohol level:  Lab Results  Component Value Date   [facility] <5 04/07/2015   ETH <5 03/04/2015    Physical Findings: AIMS: Facial and Oral Movements Muscles of Facial Expression: None, normal Lips and Perioral Area: None, normal Jaw: None, normal Tongue: None, normal,Extremity Movements Upper (arms, wrists, hands, fingers): None, normal Lower (legs, knees, ankles, toes): None, normal, Trunk Movements Neck, shoulders, hips: None, normal, Overall Severity Severity of abnormal movements (highest score from questions above): None, normal Incapacitation due to abnormal movements: None, normal Patient's awareness of abnormal movements  (rate only patient's report): No Awareness, Dental Status Current problems with teeth and/or dentures?: Yes (dental caries) Does patient usually wear dentures?: No  CIWA:  CIWA-Ar Total: 1 COWS:  COWS Total Score: 3  Musculoskeletal: Strength & Muscle Tone: within normal limits Gait & Station: normal Patient leans: N/A  Psychiatric Specialty Exam: ROS denies headache, denies chest pain, denies shortness of breath, no vomiting  Blood pressure 113/82, pulse 81, temperature 97.5 F (36.4 C), temperature source Oral, resp. rate 20, height 5' 5.5" (1.664 m), weight 143 lb (64.864 kg).Body mass index is 23.43 kg/(m^2).  General Appearance: Fairly Groomed  Engineer, water::  Fair  Speech:  Normal Rate  Volume:  Normal  Mood:  Anxious and Depressed  Affect:  inappropriate affect at times, smiles , laughs at times for no known reason  Thought Process:  generally linear  Orientation:  Other:  fully alert and attentive   Thought Content:  describes auditory hallucinations, denies command hallucinations, no delusions expressed   Suicidal Thoughts:  No at this time denies any suicidal or self injurious ideations   Homicidal Thoughts:  No  Memory:  recent and remote grossly intact   Judgement:  Fair  Insight:  Fair  Psychomotor Activity:  mild restlessness at times   Concentration:  Fair  Recall:  Good  Fund of Knowledge:Good  Language: Good  Akathisia:  Negative  Handed:  Right  AIMS (if indicated):     Assets:  Desire for Improvement Resilience  ADL's:  Fair   Cognition: WNL  Sleep:  Number of Hours: 6.75  Assessment- patient is presenting with  psychotic symptoms, mainly auditory hallucinations and some inappropriate affect. He reports depression, but states it is improving, and denies suicidal ideations at this time. He has a history of substance dependence, but states that he had not been using drugs for several days prior to admission, and UDS negative on admission . Thus far tolerating  Zyprexa well . Treatment Plan Summary: Daily contact with patient to assess and evaluate symptoms and progress in treatment, Medication management, Plan inpatient treatment  and medications as below  Encourage milieu and group participation to work on coping skills and symptom reduction Increase Zyprexa to 10 mgrs QHS for psychosis, mood Continue Celexa 10 mgrs QDAY for depression, anxiety Start Ativan 1 mgr Q 6 hours PRN for agitation, severe anxiety as needed  Continue Trazodone 50 mgrs QHS PRN for insomnia as needed   Neita Garnet, MD 04/09/2015, 3:35 PM

## 2015-04-09 NOTE — Progress Notes (Addendum)
Patient ID: Donata ClayJustin Osbourn, male   DOB: [birth year], 25 y.o.   MRN: [id number]  Pt currently presents with a flat affect and guarded behavior. Pt reports "on and off" anxiety, depression and hopelessness. Pt reports his daily goal is "getting out of here, stopping this" Per self inventory pt reports anxiety at a 4. Pt reports that he "[facility]" [facility] but cannot identify exactly why. He isolates himself, staying in bed for most of the day. Pt reports good sleep, a good appetite and poor concentration.   Pt provided with medications per providers orders. Pt's labs and vitals were monitored throughout the day. Pt supported emotionally and encouraged to express concerns and questions. Pt educated on medications and assertiveness techniques.   Pt's safety ensured with 15 minute and environmental checks. Pt currently denies SI/HI and A/V hallucinations. Pt verbally agrees to seek staff if SI/HI or A/VH occurs and to consult with staff before acting on these thoughts. Will continue POC.

## 2015-04-09 NOTE — [facility] Group Notes (Signed)
[facility] Group Notes:  (Nursing/MHT/Case Management/Adjunct)  Date:  04/09/2015  Time:  0900 am  Type of Therapy:  Recovery Group  Participation Level:  Did Not Attend Patient invited; declined to attend.  Cranford MonBeaudry, Rafik Koppel Evans 04/09/2015, 10:48 AM

## 2015-04-09 NOTE — Progress Notes (Signed)
Recreation Therapy Notes  Animal-Assisted Activity (AAA) Program Checklist/Progress Notes Patient Eligibility Criteria Checklist & Daily Group note for Rec Tx Intervention  Date: 03.07.2017 Time: 2:45pm Location: [address] Dayroom   AAA/T Program Assumption of Risk Form signed by Patient/ or Parent Legal Guardian yes  Patient is free of allergies or sever asthma yes  Patient reports no fear of animals yes  Patient reports no history of cruelty to animals yes  Patient understands his/her participation is voluntary yes  Patient washes hands before animal contact yes  Patient washes hands after animal contact yes  Behavioral Response: Appropriate   Education: Hand Washing, Appropriate Animal Interaction   Education Outcome: Acknowledges education.   Clinical Observations/Feedback: Patient interacted appropriately with therapy dog and peers during session.   Stayce Delancy L Malai Lady, LRT/CTRS  Giorgio Chabot L 04/09/2015 3:07 PM 

## 2015-04-09 NOTE — Progress Notes (Signed)
Adult Psychoeducational Group Note  Date:  04/09/2015 Time:  10:14 PM  Group Topic/Focus:  Wrap-Up Group:   The focus of this group is to help patients review their daily goal of treatment and discuss progress on daily workbooks.  Participation Level:  Active  Participation Quality:  Appropriate  Affect:  Appropriate  Cognitive:  Alert  Insight: Appropriate  Engagement in Group:  Engaged  Modes of Intervention:  Discussion  Additional Comments:  Patient goal for today was to attend all 3 meals.On a scale between 1-10, (1=worst, 10=best) patient rated her day a 5.   Ardel Jagger L Cannon Arreola 04/09/2015, 10:14 PM

## 2015-04-09 NOTE — Progress Notes (Signed)
Jill AlexandersJustin endorses AH. Hearing his friends talking to him. States "It's hard to say what they are telling me". Rates Anxiety 5/10 Depression 5/10. He stares blankly at this writer several times and then begins to smile inappropriately during our conversation.  Denies SI/HI/VH. Encouragement and support given. Medications administered as prescribed. Continue Q 15 minute checks for patient safety and medication effectiveness.

## 2015-04-10 LAB — T3, FREE: T3, Free: 3.4 pg/mL (ref 2.0–4.4)

## 2015-04-10 LAB — HEMOGLOBIN A1C
HEMOGLOBIN A1C: 5.3 % (ref 4.8–5.6)
MEAN PLASMA GLUCOSE: 105 mg/dL

## 2015-04-10 MED ORDER — OLANZAPINE 7.5 MG PO TABS
15.0000 mg | ORAL_TABLET | Freq: Every day | ORAL | Status: DC
  Administered 2015-04-10 – 2015-04-11 (×2): 15 mg via ORAL
  Filled 2015-04-10 (×4): qty 2

## 2015-04-10 NOTE — Progress Notes (Signed)
Recreation Therapy Notes  Date: 03.08.2017 Time: 9:30am Location: [address] Group Room  Group Topic: Stress Management  Goal Area(s) Addresses:  Patient will actively participate in stress management techniques presented during session.   Behavioral Response: Did not attend.   Jaysa Kise L Yogi Arther, LRT/CTRS         Jennae Hakeem L 04/10/2015 10:16 AM 

## 2015-04-10 NOTE — Progress Notes (Signed)
Patient ID: Michael Costa, male   DOB: 1990/12/26, 25 y.o.   MRN: [id number]  DAR: Pt. Denies SI/HI and A/V Hallucinations however remains preoccupied. He reports sleep was good, appetite is good, energy level is normal, and concentration is poor. He rates depression and hopelessness 7/10 and anxiety 5/10. Patient does not report any pain or discomfort at this time. Support and encouragement provided to the patient. Scheduled medications administered to patient per physician's orders. PRN Vistaril administered to patient when he reported increased anxiety. Patient is minimal and forwards little to Clinical research associatewriter. Q15 minute checks are maintained for safety.

## 2015-04-10 NOTE — Progress Notes (Signed)
Patient ID: Michael Costa, male   DOB: 1990-04-03, 25 y.o.   MRN: [id number] [facility] MD Progress Note  04/10/2015 2:16 PM Gregary Blackard  MRN:  [id number] Subjective:  Patient reports he is feeling " a little better today". Does not endorse medication side effects. Does continue to report auditory hallucinations, and reports hearing the voices of friends who talk to him. Mentions that maybe " there is a microphone somewhere " where they talk through .   Objective : I have discussed case with treatment team and have met with patient . Remains psychotic, with auditory hallucinations as above . He does report feeling better, and today presents somewhat calmer, and is better groomed. Inappropriate affect, such as smiling or laughing for no reason is still noticeable but to a lesser degree than yesterday. Reports some depression and a subjective sense of anxiety. He denies any command hallucinations.  He denies any suicidal ideations . Insight into hallucinations, psychosis is limited .  He is tolerating Zyprexa well thus far - denies medication side effects . Visible on unit, going to some groups, limited interactions with peers .  Principal Problem:  MDD,with psychotic features , consider also substance induced psychosis  Diagnosis:   Patient Active Problem List   Diagnosis Date Noted  . MDD (major depressive disorder), recurrent, severe, with psychosis ([place]) [F33.3] 04/07/2015  . MDD (major depressive disorder), single episode, moderate ([place]) [F32.1] 03/05/2015  . Opioid use disorder, moderate, dependence ([place]) [F11.20] 03/05/2015  . Alcohol use disorder, mild, abuse [F10.10] 03/05/2015  . 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 Tobacco use disorder [F17.200] 03/05/2015  . 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 Polysubstance abuse     opiates, benzos, marijuana   History reviewed. No pertinent past surgical history. Family History:  Family History  Problem Relation Age of Onset  . Mental illness Neg Hx     Social History:  History  Alcohol Use  . 3.0 oz/week  . 5 Cans of beer per week     History  Drug Use  . Yes  . Special: Marijuana, Hydrocodone, Oxycodone, Benzodiazepines    Comment: pain pills, opiates, benzos    Social History   Social History  . Marital Status: Single    Spouse Name: N/A  . Number of Children: N/A  . Years of Education: N/A   Social History Main Topics  . Smoking status: Current Every Day Smoker -- 2.00 packs/day    Types: Cigarettes  . Smokeless tobacco: None  . Alcohol Use: 3.0 oz/week    5 Cans of beer per week  . Drug Use: Yes    Special: Marijuana, Hydrocodone, Oxycodone, Benzodiazepines     Comment: pain pills, opiates, benzos  . Sexual Activity: Not Currently    Birth Control/ Protection: None   Other Topics Concern  . None   Social History Narrative   Additional Social History:    Pain Medications: yes Prescriptions: reports abusing opiates (crusing and snorting them) Over the Counter: Denies History of alcohol / drug use?: Yes Longest period of sobriety (when/how long): 10 days Negative Consequences of Use: Work / Youth worker, Personal relationships Withdrawal Symptoms: Irritability   Name of Substance 2: "2 to 3 of any pain med" (snorting)- opiates 2 - Age of First Use: 20 2 - Amount (size/oz):  2-3 of any pain med per pt 2 - Frequency: daily 2 - Duration: years 2 - Last Use / Amount: 04/04/15  Sleep: Good  Appetite:   Improving   Current Medications: Current Facility-Administered Medications  Medication Dose Route Frequency Provider Last Rate Last Dose  . acetaminophen (TYLENOL) tablet 650 mg  650 mg Oral Q6H PRN Lurena Nida, NP      . citalopram (CELEXA) tablet 10 mg  10 mg Oral Daily Jenne Campus, MD   10 mg at 04/10/15 0815  . feeding supplement (ENSURE ENLIVE) (ENSURE ENLIVE) liquid 237 mL  237 mL Oral BID BM Nicholaus Bloom, MD   237 mL at 04/10/15 0815  . hydrOXYzine (ATARAX/VISTARIL) tablet 25 mg  25 mg Oral Q6H PRN Jenne Campus, MD   25 mg at 04/09/15 1356  . Influenza vac split quadrivalent PF (FLUARIX) injection 0.5 mL  0.5 mL Intramuscular Tomorrow-1000 Nicholaus Bloom, MD   0.5 mL at 04/08/15 1000  . loperamide (IMODIUM) capsule 2-4 mg  2-4 mg Oral PRN Jenne Campus, MD      . LORazepam (ATIVAN) tablet 1 mg  1 mg Oral Q6H PRN Jenne Campus, MD   1 mg at 04/09/15 1845  . multivitamin with minerals tablet 1 tablet  1 tablet Oral Daily Jenne Campus, MD   1 tablet at 04/10/15 0815  . nicotine (NICODERM CQ - dosed in mg/24 hours) patch 21 mg  21 mg Transdermal Daily Nicholaus Bloom, MD   21 mg at 04/10/15 0815  . OLANZapine (ZYPREXA) tablet 10 mg  10 mg Oral QHS Jenne Campus, MD   10 mg at 04/09/15 2115  . ondansetron (ZOFRAN-ODT) disintegrating tablet 4 mg  4 mg Oral Q6H PRN Myer Peer Cobos, MD      . pneumococcal 23 valent vaccine (PNU-IMMUNE) injection 0.5 mL  0.5 mL Intramuscular Tomorrow-1000 Nicholaus Bloom, MD   0.5 mL at 04/08/15 1000  . thiamine (B-1) injection 100 mg  100 mg Intramuscular Once Jenne Campus, MD   100 mg at 04/08/15 1810  . thiamine (VITAMIN B-1) tablet 100 mg  100 mg Oral Daily Jenne Campus, MD   100 mg at 04/10/15 0815  . traZODone (DESYREL) tablet 50 mg  50 mg Oral QHS PRN Lurena Nida, NP   50 mg at 04/09/15 2115    Lab Results:  Results for orders placed or performed during the hospital encounter of 04/07/15 (from the past 48 hour(s))  TSH     Status: None   Collection Time: 04/09/15  6:15 AM  Result Value Ref Range   TSH 0.382 0.350 - 4.500 uIU/mL    Comment: Performed at [facility]  T4, free     Status: None   Collection Time: 04/09/15  6:15 AM  Result Value Ref Range   Free T4 0.92 0.61 - 1.12  ng/dL    Comment: Performed at [facility]  T3, free     Status: None   Collection Time: 04/09/15  6:15 AM  Result Value Ref Range   T3, Free 3.4 2.0 - 4.4 pg/mL    Comment: (NOTE) Performed At: [facility] [place], [place] [id number] Lindon Romp MD [id number] Performed at [facility]   Lipid panel     Status: Abnormal   Collection Time: 04/09/15  6:15 AM  Result Value Ref Range   Cholesterol 130 0 -  200 mg/dL   Triglycerides 72 <150 mg/dL   HDL 31 (L) >40 mg/dL   Total CHOL/HDL Ratio 4.2 RATIO   VLDL 14 0 - 40 mg/dL   LDL Cholesterol 85 0 - 99 mg/dL    Comment:        Total Cholesterol/HDL:CHD Risk Coronary Heart Disease Risk Table                     Men   Women  1/2 Average Risk   3.4   3.3  Average Risk       5.0   4.4  2 X Average Risk   9.6   7.1  3 X Average Risk  23.4   11.0        Use the calculated Patient Ratio above and the CHD Risk Table to determine the patient's CHD Risk.        ATP III CLASSIFICATION (LDL):  <100     mg/dL   Optimal  100-129  mg/dL   Near or Above                    Optimal  130-159  mg/dL   Borderline  160-189  mg/dL   High  >190     mg/dL   Very High Performed at [facility]   Hemoglobin A1c     Status: None   Collection Time: 04/09/15  6:15 AM  Result Value Ref Range   Hgb A1c MFr Bld 5.3 4.8 - 5.6 %    Comment: (NOTE)         Pre-diabetes: 5.7 - 6.4         Diabetes: >6.4         Glycemic control for adults with diabetes: <7.0    Mean Plasma Glucose 105 mg/dL    Comment: (NOTE) Performed At: [facility] [place], [place] [id number] Lindon Romp MD [id number] Performed at [facility]     Blood Alcohol level:  Lab Results  Component Value Date   [facility] <5 04/07/2015   ETH <5 03/04/2015    Physical Findings: AIMS: Facial and Oral Movements Muscles of Facial Expression: None, normal Lips and Perioral  Area: None, normal Jaw: None, normal Tongue: None, normal,Extremity Movements Upper (arms, wrists, hands, fingers): None, normal Lower (legs, knees, ankles, toes): None, normal, Trunk Movements Neck, shoulders, hips: None, normal, Overall Severity Severity of abnormal movements (highest score from questions above): None, normal Incapacitation due to abnormal movements: None, normal Patient's awareness of abnormal movements (rate only patient's report): No Awareness, Dental Status Current problems with teeth and/or dentures?: Yes (dental caries) Does patient usually wear dentures?: No  CIWA:  CIWA-Ar Total: 0 COWS:  COWS Total Score: 3  Musculoskeletal: Strength & Muscle Tone: within normal limits Gait & Station: normal Patient leans: N/A  Psychiatric Specialty Exam: ROS denies headache, denies chest pain, denies shortness of breath, no vomiting  Blood pressure 119/56, pulse 71, temperature 97.5 F (36.4 C), temperature source Oral, resp. rate 20, height 5' 5.5" (1.664 m), weight 143 lb (64.864 kg).Body mass index is 23.43 kg/(m^2).  General Appearance: improved grooming compared to admission  Eye Contact::  Improved   Speech:  Normal Rate  Volume:  Normal  Mood:  Reports improvement but still depressed   Affect:  inappropriate affect at times, smiles , laughs at times for no known reason- this is somewhat improved compared to yesterday   Thought Process:  generally linear  Orientation:  Other:  fully alert and attentive   Thought Content:   (+) auditory hallucinations, as above, and also some vague delusions, such as stating there may be hidden microphone   Suicidal Thoughts:  No at this time denies any suicidal or self injurious ideations   Homicidal Thoughts:  No  Memory:  recent and remote grossly intact   Judgement:  Fair  Insight:   Limited   Psychomotor Activity:  Less restless , no agitation at this time   Concentration:  Fair  Recall:  Good  Fund of Knowledge:Good   Language: Good  Akathisia:  Negative  Handed:  Right  AIMS (if indicated):     Assets:  Desire for Improvement Resilience  ADL's:  Fair   Cognition: WNL  Sleep:  Number of Hours: 6.75  Assessment- patient  Continues to present with psychotic symptoms, auditory hallucinations.  Reports ongoing depression, but presents with some improvement in range of affect, eye contact , grooming noted. Tolerating Zyprexa and Celexa well .  Treatment Plan Summary: Daily contact with patient to assess and evaluate symptoms and progress in treatment, Medication management, Plan inpatient treatment  and medications as below  Encourage milieu and group participation to work on coping skills and symptom reduction Increase Zyprexa to 15 mgrs QHS for psychosis, mood Continue Celexa 10 mgrs QDAY for depression, anxiety Continue Ativan 1 mgr Q 6 hours PRN for agitation, severe anxiety as needed  Continue Trazodone 50 mgrs QHS PRN for insomnia as needed   Neita Garnet, MD 04/10/2015, 2:16 PM

## 2015-04-10 NOTE — [facility] Group Notes (Signed)
[facility] LCSW Group Therapy 04/10/2015 1:15 PM  Type of Therapy: Group Therapy- Emotion Regulation  Pt came in and out of group and did not participate in discussion.   Chad CordialLauren Carter, LCSWA 04/10/2015 3:22 PM

## 2015-04-10 NOTE — [facility] Group Notes (Signed)
[facility] LCSW Aftercare Discharge Planning Group Note  04/10/2015 8:45 AM  Pt did not attend, declined invitation.   Asna Muldrow Carter, LCSWA 04/10/2015 9:38 AM  

## 2015-04-10 NOTE — Progress Notes (Signed)
Patient ID: Michael ClayJustin Costa, male   DOB: [birth year], 25 y.o.   MRN: [id number] D: Client visits with parents during visiting hours. Client reports nice visit. Client reports goal "to get better, start being more productive" "been wearing myself out" "low self esteem" "hearing voices" A: Writer provided emotional support, medications reviewed and administered. Staff will monitor q3815min for safety. R: Client is safe on the unit, attended group.

## 2015-04-10 NOTE — Plan of Care (Signed)
Problem: Diagnosis: Increased Risk For Suicide Attempt Goal: STG-Patient Will Comply With Medication Regime Outcome: Progressing Michael Costa is compliant with meds this morning, no adverse effects observed nor verbalized by patient

## 2015-04-10 NOTE — [facility] Group Notes (Signed)
[facility] LCSW Group Therapy 04/10/2015 1:15 PM  Type of Therapy: Group Therapy- Feelings about Diagnosis  Participation Level: Minimal  Participation Quality:  N/A  Affect:  Incongruent  Cognitive: Alert and Oriented   Insight:  Limited  Engagement in Therapy: Minimal  Modes of Intervention: Clarification, Confrontation, Discussion, Education, Exploration, Limit-setting, Orientation, Problem-solving, Rapport Building, Dance movement psychotherapisteality Testing, Socialization and Support  Description of Group:   This group will allow patients to explore their thoughts and feelings about diagnoses they have received. Patients will be guided to explore their level of understanding and acceptance of these diagnoses. Facilitator will encourage patients to process their thoughts and feelings about the reactions of others to their diagnosis, and will guide patients in identifying ways to discuss their diagnosis with significant others in their lives. This group will be process-oriented, with patients participating in exploration of their own experiences as well as giving and receiving support and challenge from other group members.  Summary of Progress/Problems:  Pt expressed that he would like to write the group activity in his room. Pt returned but declined to participate. He was observed to be smiling inappropriately at times, possibly responding to internal stimuli.  Therapeutic Modalities:   Cognitive Behavioral Therapy Solution Focused Therapy Motivational Interviewing Relapse Prevention Therapy  Chad CordialLauren Carter, LCSWA 04/10/2015 8:03 AM \

## 2015-04-11 MED ORDER — CITALOPRAM HYDROBROMIDE 20 MG PO TABS
20.0000 mg | ORAL_TABLET | Freq: Every day | ORAL | Status: DC
  Administered 2015-04-12 – 2015-04-15 (×4): 20 mg via ORAL
  Filled 2015-04-11 (×6): qty 1

## 2015-04-11 NOTE — Progress Notes (Signed)
Patient ID: Michael ClayJustin Strupp, male   DOB: 02/09/1990, 25 y.o.   MRN: [id number] D: Client is visible on the unit, anxious, labile, restless walking back and forth, continuous to go in and out of doctor's office. Client still report voices "you don't hear those voices talking" Client laughing inappropriately and walking the halls. A: Writer provided emotional support, medication administered for anxiety (see MAR). Staff will monitor q4815min for safety. R: Client is safe on the unit, attended group.

## 2015-04-11 NOTE — [facility] Group Notes (Signed)
[facility] Group Notes:  (Nursing/MHT/Case Management/Adjunct)  Date:  04/11/2015  Time:  9:45 AM  Type of Therapy:  Psychoeducational Skills  Participation Level:  Did Not Attend  Participation Quality:  N/A  Affect:  N/A  Cognitive:  N/A  Insight:  None  Engagement in Group:  None  Modes of Intervention:  Discussion and Education  Summary of Progress/Problems: Patient invited to group but did not attend.   Kanija Remmel E 04/11/2015, 9:45 AM 

## 2015-04-11 NOTE — [facility] Group Notes (Signed)
[facility] LCSW Group Therapy 04/11/2015  1:15 PM   Type of Therapy: Group Therapy  Participation Level: Did Not Attend. Patient invited to participate but declined.   Hermena Swint, MSW, LCSW Clinical Social Worker [facility] [phone]   

## 2015-04-11 NOTE — Progress Notes (Signed)
[facility] Group Notes:  (Nursing/MHT/Case Management/Adjunct)  Date:  04/11/2015  Time:  12:57 AM  Type of Therapy:  Psychoeducational Skills  Participation Level:  Active  Participation Quality:  Appropriate  Affect:  Appropriate  Cognitive:  Appropriate  Insight:  Good  Engagement in Group:  Engaged  Modes of Intervention:  Education  Summary of Progress/Problems: The patient shared with the group last evening that he had a "pretty good" day overall. He also stated that he felt "more focused" and felt better in general. As for tomorrow, the patient's goal is to speak with the doctor.   Landy Mace S 04/11/2015, 12:57 AM

## 2015-04-11 NOTE — Progress Notes (Signed)
Adult Psychoeducational Group Note  Date:  04/11/2015 Time:  10:29 PM  Group Topic/Focus:  Wrap-Up Group:   The focus of this group is to help patients review their daily goal of treatment and discuss progress on daily workbooks.  Participation Level:  Active  Participation Quality:  Appropriate  Affect:  Appropriate  Cognitive:  Appropriate  Insight: Good  Engagement in Group:  Engaged  Modes of Intervention:  Activity  Additional Comments:  Patient rated her day a 5. Goal is to get his head clean and more organized thoughts.  Natasha MeadKiara M Gottlieb Zuercher 04/11/2015, 10:29 PM

## 2015-04-11 NOTE — Progress Notes (Signed)
Patient ID: Michael Costa, male   DOB: 1990-08-23, 23 y.o.   MRN: [id number] [facility] MD Progress Note  04/11/2015 2:34 PM Michael Costa  MRN:  [id number] Subjective:  Patient reports he is starting to feel better and states that the hallucinations have started to subside . Denies medication side effects at this time .  Objective : I have discussed case with treatment team and have met with patient . As discussed with staff, patient going to some groups, but at other times isolates in room. Pleasant and cooperative with Probation officer, but report from nursing is that he had presented negative and refusing to answer questions earlier in the day. At this time patient reports hallucinations are subsiding . He does state he sometimes thinks that there " has to be a microphone somewhere" to explain how these voices are occuring, but no other delusional ideations expressed . Today not exhibiting inappropriate affect, and presents with a fuller range of affect at this time. At patient's request I spoke with his mother on the phone. She visited him yesterday and corroborates that patient seems partially improved although still depressed and not at baseline . She states patient has a significant history of substance abuse and is hoping he will go to a Rehab setting on discharge Denies medication side effects.   Principal Problem:  MDD,with psychotic features , consider also substance induced psychosis  Diagnosis:   Patient Active Problem List   Diagnosis Date Noted  . MDD (major depressive disorder), recurrent, severe, with psychosis ([place]) [F33.3] 04/07/2015  . MDD (major depressive disorder), single episode, moderate ([place]) [F32.1] 03/05/2015  . Opioid use disorder, moderate, dependence ([place]) [F11.20] 03/05/2015  . Alcohol use disorder, mild, abuse [F10.10] 03/05/2015  . 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 Tobacco use disorder [F17.200] 03/05/2015  . 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 Polysubstance abuse     opiates, benzos, marijuana   History reviewed. No pertinent past surgical history. Family History:  Family History  Problem Relation Age of Onset  . Mental illness Neg Hx     Social History:  History  Alcohol Use  . 3.0 oz/week  . 5 Cans of beer per week     History  Drug Use  . Yes  . Special: Marijuana, Hydrocodone, Oxycodone, Benzodiazepines    Comment: pain pills, opiates, benzos    Social History   Social History  . Marital Status: Single    Spouse Name: N/A  . Number of Children: N/A  . Years of Education: N/A   Social History Main Topics  . Smoking status: Current Every Day Smoker -- 2.00 packs/day    Types: Cigarettes  . Smokeless tobacco: None  . Alcohol Use: 3.0 oz/week    5 Cans of beer per week  . Drug Use: Yes    Special: Marijuana, Hydrocodone, Oxycodone, Benzodiazepines     Comment: pain pills, opiates, benzos  . Sexual Activity: Not Currently    Birth Control/ Protection: None   Other Topics Concern  . None   Social History Narrative   Additional Social History:    Pain Medications: yes Prescriptions: reports abusing opiates (crusing and snorting them) Over the Counter: Denies History of alcohol / drug use?: Yes Longest period of sobriety (when/how long): 10 days Negative Consequences of Use: Work /  School, Personal relationships Withdrawal Symptoms: Irritability   Name of Substance 2: "2 to 3 of any pain med" (snorting)- opiates 2 - Age of First Use: 20 2 - Amount (size/oz): 2-3 of any pain med per pt 2 - Frequency: daily 2 - Duration: years 2 - Last Use / Amount: 04/04/15  Sleep: Good  Appetite:   Improving   Current Medications: Current Facility-Administered Medications  Medication Dose Route Frequency Provider Last  Rate Last Dose  . acetaminophen (TYLENOL) tablet 650 mg  650 mg Oral Q6H PRN Lurena Nida, NP      . citalopram (CELEXA) tablet 10 mg  10 mg Oral Daily Jenne Campus, MD   10 mg at 04/10/15 0815  . feeding supplement (ENSURE ENLIVE) (ENSURE ENLIVE) liquid 237 mL  237 mL Oral BID BM Nicholaus Bloom, MD   237 mL at 04/10/15 1438  . hydrOXYzine (ATARAX/VISTARIL) tablet 25 mg  25 mg Oral Q6H PRN Jenne Campus, MD   25 mg at 04/10/15 1814  . Influenza vac split quadrivalent PF (FLUARIX) injection 0.5 mL  0.5 mL Intramuscular Tomorrow-1000 Nicholaus Bloom, MD   0.5 mL at 04/08/15 1000  . loperamide (IMODIUM) capsule 2-4 mg  2-4 mg Oral PRN Jenne Campus, MD      . LORazepam (ATIVAN) tablet 1 mg  1 mg Oral Q6H PRN Jenne Campus, MD   1 mg at 04/09/15 1845  . multivitamin with minerals tablet 1 tablet  1 tablet Oral Daily Jenne Campus, MD   1 tablet at 04/10/15 0815  . nicotine (NICODERM CQ - dosed in mg/24 hours) patch 21 mg  21 mg Transdermal Daily Nicholaus Bloom, MD   21 mg at 04/10/15 0815  . OLANZapine (ZYPREXA) tablet 15 mg  15 mg Oral QHS Jenne Campus, MD   15 mg at 04/10/15 2207  . ondansetron (ZOFRAN-ODT) disintegrating tablet 4 mg  4 mg Oral Q6H PRN Myer Peer Cobos, MD      . pneumococcal 23 valent vaccine (PNU-IMMUNE) injection 0.5 mL  0.5 mL Intramuscular Tomorrow-1000 Nicholaus Bloom, MD   0.5 mL at 04/08/15 1000  . thiamine (B-1) injection 100 mg  100 mg Intramuscular Once Jenne Campus, MD   100 mg at 04/08/15 1810  . thiamine (VITAMIN B-1) tablet 100 mg  100 mg Oral Daily Jenne Campus, MD   100 mg at 04/10/15 0815  . traZODone (DESYREL) tablet 50 mg  50 mg Oral QHS PRN Lurena Nida, NP   50 mg at 04/10/15 2207    Lab Results:  No results found for this or any previous visit (from the past 48 hour(s)).  Blood Alcohol level:  Lab Results  Component Value Date   ETH <5 04/07/2015   ETH <5 03/04/2015    Physical Findings: AIMS: Facial and Oral  Movements Muscles of Facial Expression: None, normal Lips and Perioral Area: None, normal Jaw: None, normal Tongue: None, normal,Extremity Movements Upper (arms, wrists, hands, fingers): None, normal Lower (legs, knees, ankles, toes): None, normal, Trunk Movements Neck, shoulders, hips: None, normal, Overall Severity Severity of abnormal movements (highest score from questions above): None, normal Incapacitation due to abnormal movements: None, normal Patient's awareness of abnormal movements (rate only patient's report): No Awareness, Dental Status Current problems with teeth and/or dentures?: Yes (dental caries) Does patient usually wear dentures?: No  CIWA:  CIWA-Ar Total: 1 COWS:  COWS Total Score: 3  Musculoskeletal: Strength & Muscle Tone:  within normal limits Gait & Station: normal Patient leans: N/A  Psychiatric Specialty Exam: ROS denies headache, denies chest pain, denies shortness of breath, no vomiting  Blood pressure 120/53, pulse 91, temperature 97.5 F (36.4 C), temperature source Oral, resp. rate 20, height 5' 5.5" (1.664 m), weight 143 lb (64.864 kg).Body mass index is 23.43 kg/(m^2).  General Appearance: improved grooming   Eye Contact::  Improved   Speech:  Normal Rate  Volume:  Normal  Mood:  At this time reports improved mood, but still depressed   Affect: more reactive at this time but as per notes irritable and negative at times, no inappropriate affect noted at this time   Thought Process:  generally linear  Orientation:  Other:  fully alert and attentive   Thought Content:   (+) auditory hallucinations, as above, which he states are subsiding, at this time does not appear internally preoccupied   Suicidal Thoughts:  No at this time denies any suicidal or self injurious ideations   Homicidal Thoughts:  No  Memory:  recent and remote grossly intact   Judgement:  Fair but is improving   Insight:   Limited   Psychomotor Activity:  Less restless , no  agitation at this time   Concentration:  Fair  Recall:  Good  Fund of Knowledge:Good  Language: Good  Akathisia:  Negative  Handed:  Right  AIMS (if indicated):     Assets:  Desire for Improvement Resilience  ADL's:  Fair   Cognition: WNL  Sleep:  Number of Hours: 6.75  Assessment- patient presents partially improved - better groomed, improved affect, decrease in hallucinations. Presentation still variable, and as per staff at times irritable and negative . Denies SI. Thus far tolerating Zyprexa and Celexa well .  Treatment Plan Summary: Daily contact with patient to assess and evaluate symptoms and progress in treatment, Medication management, Plan inpatient treatment  and medications as below  Encourage milieu and group participation to work on coping skills and symptom reduction Continue  Zyprexa   15 mgrs QHS for psychosis, mood Continue Celexa 10 mgrs QDAY for depression, anxiety Continue Ativan 1 mgr Q 6 hours PRN for agitation, severe anxiety as needed  Continue Trazodone 50 mgrs QHS PRN for insomnia as needed   Neita Garnet, MD 04/11/2015, 2:34 PM

## 2015-04-11 NOTE — Progress Notes (Signed)
Patient ID: Michael ClayJustin Costa, male   DOB: Jan 14, 1991, 25 y.o.   MRN: [id number]  Patient did not take scheduled medications this morning although encouraged by nursing staff multiple times. Patient also refused morning vitals. Respirations appear even and unlabored and he appears in no distress. Patient is uncooperative and oppositional at this time. Refuses to answer staff however can go to cafeteria for meals without difficulty. 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 UTA SI/HI/A/V hallucinations at this time due to patient being electively mute. He does not appear vested in his plan of care/treatment. MD Cobos and treatment team notified of this. He remains isolated to his room throughout the morning. Q15 minute checks are maintained for safety.

## 2015-04-12 MED ORDER — OLANZAPINE 10 MG PO TABS
10.0000 mg | ORAL_TABLET | Freq: Every day | ORAL | Status: DC
  Administered 2015-04-12 – 2015-04-14 (×3): 10 mg via ORAL
  Filled 2015-04-12 (×5): qty 1

## 2015-04-12 MED ORDER — ATOMOXETINE HCL 25 MG PO CAPS
25.0000 mg | ORAL_CAPSULE | Freq: Every day | ORAL | Status: DC
  Administered 2015-04-12 – 2015-04-15 (×4): 25 mg via ORAL
  Filled 2015-04-12 (×7): qty 1

## 2015-04-12 MED ORDER — OLANZAPINE 7.5 MG PO TABS: 7.5000 mg | ORAL_TABLET | Freq: Every day | ORAL | Status: DC

## 2015-04-12 NOTE — Progress Notes (Signed)
Recreation Therapy Notes   Date: 03.10.2017 Time: 9:30am Location: [address] Group Room   Group Topic: Stress Management  Goal Area(s) Addresses:  Patient will actively participate in stress management techniques presented during session.   Behavioral Response: Did not attend.    Marykay Lexenise L Uzair Godley, LRT/CTRS        Orla Jolliff L 04/12/2015 10:25 AM

## 2015-04-12 NOTE — [facility] Group Notes (Signed)
[facility] LCSW Group Therapy 04/12/2015 1:15 PM Type of Therapy: Group Therapy Participation Level: Minimal  Participation Quality: Attentive  Affect: Blunted  Cognitive: Alert and Oriented  Insight: Developing/Improving and Engaged  Engagement in Therapy: Developing/Improving and Engaged  Modes of Intervention: Clarification, Confrontation, Discussion, Education, Exploration, Limit-setting, Orientation, Problem-solving, Rapport Building, Reality Testing, Socialization and Support  Summary of Progress/Problems: The topic for today was feelings about relapse. Pt discussed what relapse prevention is to them and identified triggers that they are on the path to relapse. Pt processed their feeling towards relapse and was able to relate to peers. Pt discussed coping skills that can be used for relapse prevention. Patient did not participate in discussion despite CSW encouragement.  Shoaib Siefker, MSW, LCSW Clinical Social Worker [facility] [phone]   

## 2015-04-12 NOTE — Progress Notes (Signed)
Patient ID: Michael Costa, male   DOB: August 11, 1990, 25 y.o.   MRN: [id number] Patient ID: Michael ClayJustin Costa, male   DOB: August 11, 1990, 25 y.o.   MRN: [id number] [facility]  04/12/2015 4:09 PM Michael Costa  MRN:  [id number]  Subjective: Michael Costa reports, "I will feel a lot better if I can focus more. I don't even know if my medicines are helping. I need the antipsychotic decreased because I'm not psychotic, I just hear some voices sometimes. I need Adderall. It helps me focus. I have been getting it off the streets".  Objective : Michael Costa is seen, chart reviewed. He is alert, oriented x 3 & aware of situation. He is visible on the unit. He complains of poor focus. Requested Adderall, says it helps him focus. Admits gets Adderall off the streets. Requested a reduction in the dose of Zyprexa. Says he is not psychotic, just hears voices sometime. He continue to endorse passive SI. Denis any intent or plans to hurt himself.  Principal Problem:  MDD,with psychotic features , consider also substance induced psychosis  Diagnosis:   Patient Active Problem List   Diagnosis Date Noted  . MDD (major depressive disorder), recurrent, severe, with psychosis ([facility]) [F33.3] 04/07/2015  . MDD (major depressive disorder), single episode, moderate ([facility]) [F32.1] 03/05/2015  . 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 Alcohol use disorder, mild, abuse [F10.10] 03/05/2015  . 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 Tobacco use disorder [F17.200] 03/05/2015  . 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 Polysubstance abuse     opiates, benzos, marijuana   History reviewed. No pertinent past surgical history. Family History:  Family History  Problem Relation Age of Onset  . Mental illness  Neg Hx     Social History:  History  Alcohol Use  . 3.0 oz/week  . 5 Cans of beer per week     History  Drug Use  . Yes  . Special: Marijuana, Hydrocodone, Oxycodone, Benzodiazepines    Comment: pain pills, opiates, benzos    Social History   Social History  . Marital Status: Single    Spouse Name: N/A  . Number of Children: N/A  . Years of Education: N/A   Social History Main Topics  . Smoking status: Current Every Day Smoker -- 2.00 packs/day    Types: Cigarettes  . Smokeless tobacco: None  . Alcohol Use: 3.0 oz/week    5 Cans of beer per week  . Drug Use: Yes    Special: Marijuana, Hydrocodone, Oxycodone, Benzodiazepines     Comment: pain pills, opiates, benzos  . Sexual Activity: Not Currently    Birth Control/ Protection: None   Other Topics Concern  . None   Social History Narrative   Additional Social History:    Pain Medications: yes Prescriptions: reports abusing opiates (crusing and snorting them) Over the Counter: Denies History of alcohol / drug use?: Yes Longest period of sobriety (when/how long): 10 days Negative Consequences of Use: Work / Programmer, multimediachool, Copywriter, advertisingersonal relationships Withdrawal Symptoms: Irritability   Name of Substance 2: "2 to 3 of any pain med" (snorting)- opiates 2 - Age of First Use: 20 2 - Amount (size/oz): 2-3 of any pain med per pt 2 - Frequency: daily 2 - Duration: years 2 - Last Use / Amount: 04/04/15  Sleep: Good  Appetite:   Improving   Current Medications: Current Facility-Administered Medications  Medication Dose Route Frequency Provider Last Rate Last Dose  . acetaminophen (TYLENOL) tablet 650 mg  650 mg Oral Q6H PRN Kristeen Mans, NP      . atomoxetine (STRATTERA) capsule 25 mg  25 mg Oral Daily Sanjuana Kava, NP      . citalopram (CELEXA) tablet 20 mg  20 mg Oral Daily Craige Cotta, MD   20 mg at 04/12/15 0805  . feeding supplement (ENSURE ENLIVE) (ENSURE ENLIVE) liquid 237 mL  237 mL Oral BID BM Rachael Fee,  MD   237 mL at 04/12/15 1423  . Influenza vac split quadrivalent PF (FLUARIX) injection 0.5 mL  0.5 mL Intramuscular Tomorrow-1000 Rachael Fee, MD   0.5 mL at 04/08/15 1000  . LORazepam (ATIVAN) tablet 1 mg  1 mg Oral Q6H PRN Craige Cotta, MD   1 mg at 04/12/15 1423  . multivitamin with minerals tablet 1 tablet  1 tablet Oral Daily Craige Cotta, MD   1 tablet at 04/12/15 0805  . nicotine (NICODERM CQ - dosed in mg/24 hours) patch 21 mg  21 mg Transdermal Daily Rachael Fee, MD   21 mg at 04/12/15 0805  . OLANZapine (ZYPREXA) tablet 7.5 mg  7.5 mg Oral QHS Sanjuana Kava, NP      . pneumococcal 23 valent vaccine (PNU-IMMUNE) injection 0.5 mL  0.5 mL Intramuscular Tomorrow-1000 Rachael Fee, MD   0.5 mL at 04/08/15 1000  . thiamine (B-1) injection 100 mg  100 mg Intramuscular Once Craige Cotta, MD   100 mg at 04/08/15 1810  . thiamine (VITAMIN B-1) tablet 100 mg  100 mg Oral Daily Craige Cotta, MD   100 mg at 04/12/15 0805  . traZODone (DESYREL) tablet 50 mg  50 mg Oral QHS PRN Kristeen Mans, NP   50 mg at 04/11/15 2126   Lab Results:  No results found for this or any previous visit (from the past 48 hour(s)).  Blood Alcohol level:  Lab Results  Component Value Date   ETH <5 04/07/2015   ETH <5 03/04/2015   Physical Findings: AIMS: Facial and Oral Movements Muscles of Facial Expression: None, normal Lips and Perioral Area: None, normal Jaw: None, normal Tongue: None, normal,Extremity Movements Upper (arms, wrists, hands, fingers): None, normal Lower (legs, knees, ankles, toes): None, normal, Trunk Movements Neck, shoulders, hips: None, normal, Overall Severity Severity of abnormal movements (highest score from questions above): None, normal Incapacitation due to abnormal movements: None, normal Patient's awareness of abnormal movements (rate only patient's report): No Awareness, Dental Status Current problems with teeth and/or dentures?: Yes (dental caries) Does  patient usually wear dentures?: No  CIWA:  CIWA-Ar Total: 1 COWS:  COWS Total Score: 3  Musculoskeletal: Strength & Muscle Tone: within normal limits Gait & Station: normal Patient leans: N/A  Psychiatric Specialty Exam: ROS denies headache, denies chest pain, denies shortness of breath, no vomiting  Blood pressure 117/69, pulse 77, temperature 98.4 F (36.9 C), temperature source Oral, resp. rate 16, height 5' 5.5" (1.664 m), weight 64.864 kg (143 lb).Body mass index is 23.43 kg/(m^2).  General Appearance: improved grooming   Eye Contact::  Improved   Speech:  Normal Rate  Volume:  Normal  Mood:  At this time reports improved mood, but still depressed   Affect: more reactive at this time but as per notes irritable and negative  at times, no inappropriate affect noted at this time   Thought Process:  generally linear  Orientation:  Other:  fully alert and attentive   Thought Content:   (+) auditory hallucinations, as above, which he states are subsiding, at this time does not appear internally preoccupied   Suicidal Thoughts:  No , at this time denies any suicidal or self injurious ideations   Homicidal Thoughts:  No  Memory:  recent and remote grossly intact   Judgement:  Fair, but is improving   Insight:   Limited   Psychomotor Activity:  Less restless , no agitation at this time   Concentration:  Fair  Recall:  Good  Fund of Knowledge:Good  Language: Good  Akathisia:  Negative  Handed:  Right  AIMS (if indicated):     Assets:  Desire for Improvement Resilience  ADL's:  Fair   Cognition: WNL  Sleep:  Number of Hours: 6.75   Assessment- patient presents partially improved - better groomed, improved affect, decrease in hallucinations. Presentation still variable, and as per staff at times irritable and negative . Denies SI. Thus far tolerating Zyprexa and Celexa well .   Treatment Plan Summary: Daily contact with patient to assess and evaluate symptoms and progress in  treatment, Medication management, Plan inpatient treatment  and medications as below  Encourage milieu and group participation to work on coping skills and symptom reduction Decreased Zyprexa from 15 to 10 mg QHS for psychosis, mood control Continue Celexa 10 mgrs QDAY for depression, anxiety Continue Ativan 1 mgr Q 6 hours PRN for agitation, severe anxiety as needed  Continue Trazodone 50 mgrs QHS PRN for insomnia as needed. Will initiate Strattera 25 mg for ADHD. Social worker to continue discharge/disposition plans. Continue current plan of care.  Sanjuana Kava, NP, PMHNP, FNP-BC 04/12/2015, 4:09 PM I agree with assessment and plan Reymundo Poll. Dub Mikes, M.D.

## 2015-04-12 NOTE — [facility] Group Notes (Signed)
[facility]  04/12/2015 8:45 AM  Participation Quality: Alert, Appropriate and Oriented  Mood/Affect: Flat  Depression Rating: 4  Anxiety Rating: 4  Thoughts of Suicide: Pt denies SI/HI  Will you contract for safety? Yes  Current AVH: Pt denies  Plan for Discharge/Comments: Pt attended discharge planning group and actively participated in group. CSW discussed suicide prevention education with the group and encouraged them to discuss discharge planning and any relevant barriers. Pt expressed that he was sleepy but had experienced improvement in mood.  Transportation Means: Pt reports access to transportation  Supports: No supports mentioned at this time  Chad CordialLauren Carter, LCSWA 04/12/2015 4:16 PM

## 2015-04-12 NOTE — Plan of Care (Signed)
Problem: Alteration in mood Goal: STG-Patient is able to discuss feelings and issues (Patient is able to discuss feelings and issues leading to depression)  Outcome: Progressing Client able to identify feelings AEB verbalizing "I have low self-esteem" "need to be more productive"

## 2015-04-12 NOTE — Plan of Care (Signed)
Problem: Alteration in mood & ability to function due to Goal: STG-Patient will comply with prescribed medication regimen (Patient will comply with prescribed medication regimen)  Outcome: Progressing Client is compliant with medication regime AEB taking medications as prescribed without incidence and verbalizing medication helps.

## 2015-04-12 NOTE — Progress Notes (Signed)
Patient did not attend wrap up group tonight.  

## 2015-04-12 NOTE — Tx Team (Signed)
Interdisciplinary Treatment Plan Update (Adult) Date: 04/12/2015   Date: 04/12/2015 1:10 PM  Progress in Treatment:  Attending groups: Minimally Participating in groups: No Taking medication as prescribed: Yes  Tolerating medication: Yes  Family/Significant othe contact made: No, CSW attempting to make contact with mother Patient understands diagnosis: Yes AEB seeking help with depression and substance abuse Discussing patient identified problems/goals with staff: Yes  Medical problems stabilized or resolved: Yes  Denies suicidal/homicidal ideation: Yes Patient has not harmed self or Others: Yes   New problem(s) identified: None identified at this time.   Discharge Plan or Barriers: Pt will return to mother's house and follow-up with [facility]  Additional comments:  Patient and CSW reviewed pt's identified goals and treatment plan. Patient verbalized understanding and agreed to treatment plan. CSW reviewed [facility] "Discharge Process and Patient Involvement" Form. Pt verbalized understanding of information provided and signed form.   Reason for Continuation of Hospitalization:  Depression Medication stabilization Suicidal ideation Withdrawal symptoms Psychotic  Estimated length of stay: 2-3 days  Review of initial/current patient goals per problem list:   1.  Goal(s): Patient will participate in aftercare plan  Met:  Yes  Target date: 3-5 days from date of admission   As evidenced by: Patient will participate within aftercare plan AEB aftercare provider and housing plan at discharge being identified.   04/08/15: Pt will return to mother's house and follow-up with [facility] in [place]  2.  Goal (s): Patient will exhibit decreased depressive symptoms and suicidal ideations.  Met:  Progressing  Target date: 3-5 days from date of admission   As evidenced by: Patient will utilize self rating of depression at 3 or below and demonstrate decreased signs of  depression or be deemed stable for discharge by MD. 04/08/15: Pt was admitted with symptoms of depression, rating 10/10. Pt continues to present with flat affect and depressive symptoms.  Pt will demonstrate decreased symptoms of depression and rate depression at 3/10 or lower prior to discharge. 04/12/15: Pt rates depression at 4/10; denies SI  4.  Goal(s): Patient will demonstrate decreased signs of withdrawal due to substance abuse  Met:  Yes  Target date: 3-5 days from date of admission   As evidenced by: Patient will produce a CIWA/COWS score of 0, have stable vitals signs, and no symptoms of withdrawal  04/08/15: Pt has COWS score of 3 and endorses anxiety and restlessness as symptoms of withdrawal.  04/12/15: Pt denies symptoms of withdrawal. 5.  Goal(s): Patient will demonstrate decreased signs of psychosis  . Met:  Progressing . Target date: 3-5 days from date of admission  . As evidenced by: Patient will demonstrate decreased frequency of AVH or return to baseline function    -04/08/15: Pt recently admitted with    AVH; will continue to monitor    -04/12/15: Pt continues to endorse    AVH but expresses that it is more    manageable.  Attendees:  Patient:    Family:    Physician: Dr. Sabra Heck, MD  04/12/2015 1:10 PM  Nursing: Lars Pinks, RN Case manager  04/12/2015 1:10 PM  Clinical Social Worker Peri Maris, LCSWA 04/12/2015 1:10 PM  Other: Erasmo Downer Drinkard, LCSWA 04/12/2015 1:10 PM  Clinical:  Desma Paganini, RN; Maureen Chatters, RN  04/12/2015 1:10 PM  Other: , RN Charge Nurse 04/12/2015 1:10 PM  Other: Hilda Lias, [place], [place] Social Work [phone]

## 2015-04-12 NOTE — Progress Notes (Signed)
D: Pt has been in his bed on and off this morning. He is restless at times and c/o anxiety. Pt appears to be having auditory hallucinations as he looks around as he is talking to Clinical research associatewriter. Pt verbally denies si and hi thoughts. Pt verbally denies hallucinations this morning. A:Offered support, encouragement and 15 minute checks. R:Safety maintained on the 400 hall.

## 2015-04-13 MED ORDER — HYDROXYZINE HCL 50 MG PO TABS
50.0000 mg | ORAL_TABLET | Freq: Four times a day (QID) | ORAL | Status: DC | PRN
  Administered 2015-04-13 – 2015-04-15 (×2): 50 mg via ORAL
  Filled 2015-04-13 (×2): qty 1

## 2015-04-13 NOTE — Progress Notes (Signed)
Patient ID: Michael Costa, male   DOB: [birth year], 25 y.o.   MRN: [id number]  [facility] MD Progress Note  04/13/2015 3:05 PM Michael Costa  MRN:  [id number]  Subjective: Michael Costa reports, "I don't really like to talk about my suicidal thoughts. But if you insist sometimes I wish I had a gun to shoot myself with. My depression is at a five right now. I'm not really hearing voices now because of the Zyprexa."   Objective : Michael Costa is seen, chart reviewed. He is alert, oriented x 3 & aware of situation. He is visible on the unit. He complains of poor focus. Requested Adderall, says it helps him focus. Admits gets Adderall off the streets. Requested a reduction in the dose of Zyprexa. Says he is not psychotic, just hears voices sometimes possibly related to illicit Adderrall usage.  He continue to endorse passive SI. Denies any intent or plans to hurt himself. Nursing staff report that the patient is restless on the unit and requests prn medication for patient. Michael Costa is noted to be very guarded during interaction today being reluctant to fully discuss his symptoms.   Principal Problem:  MDD,with psychotic features , consider also substance induced psychosis  Diagnosis:   Patient Active Problem List   Diagnosis Date Noted  . MDD (major depressive disorder), recurrent, severe, with psychosis (HCC) [F33.3] 04/07/2015  . MDD (major depressive disorder), single episode, moderate (HCC) [F32.1] 03/05/2015  . 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 Alcohol use disorder, mild, abuse [F10.10] 03/05/2015  . 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 Tobacco use disorder [F17.200] 03/05/2015  . 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 Polysubstance abuse     opiates, benzos, marijuana   History reviewed. No pertinent past surgical history. Family History:  Family History  Problem Relation Age of Onset  . Mental illness Neg Hx     Social History:  History  Alcohol Use  . 3.0 oz/week  . 5 Cans of beer per week     History  Drug Use  . Yes  . Special: Marijuana, Hydrocodone, Oxycodone, Benzodiazepines    Comment: pain pills, opiates, benzos    Social History   Social History  . Marital Status: Single    Spouse Name: N/A  . Number of Children: N/A  . Years of Education: N/A   Social History Main Topics  . Smoking status: Current Every Day Smoker -- 2.00 packs/day    Types: Cigarettes  . Smokeless tobacco: None  . Alcohol Use: 3.0 oz/week    5 Cans of beer per week  . Drug Use: Yes    Special: Marijuana, Hydrocodone, Oxycodone, Benzodiazepines     Comment: pain pills, opiates, benzos  . Sexual Activity: Not Currently    Birth Control/ Protection: None   Other Topics Concern  . None   Social History Narrative   Additional Social History:    Pain Medications: yes Prescriptions: reports abusing opiates (crusing and snorting them) Over the Counter: Denies History of alcohol / drug use?: Yes Longest period of sobriety (when/how long): 10 days Negative Consequences of Use: Work / Programmer, multimedia, Copywriter, advertising relationships Withdrawal Symptoms: Irritability   Name of Substance 2: "2 to 3 of any pain med" (snorting)- opiates 2 - Age of First Use: 20 2 - Amount (size/oz): 2-3 of  any pain med per pt 2 - Frequency: daily 2 - Duration: years 2 - Last Use / Amount: 04/04/15  Sleep: Good  Appetite:   Improving   Current Medications: Current Facility-Administered Medications  Medication Dose Route Frequency Provider Last Rate Last Dose  . acetaminophen (TYLENOL) tablet 650 mg  650 mg Oral Q6H PRN Kristeen Mans, NP   650 mg at 04/12/15 2124  . atomoxetine (STRATTERA) capsule 25 mg  25 mg Oral Daily Sanjuana Kava, NP   25 mg at 04/13/15 1610  . citalopram (CELEXA)  tablet 20 mg  20 mg Oral Daily Craige Cotta, MD   20 mg at 04/13/15 0832  . feeding supplement (ENSURE ENLIVE) (ENSURE ENLIVE) liquid 237 mL  237 mL Oral BID BM Rachael Fee, MD   237 mL at 04/13/15 1322  . hydrOXYzine (ATARAX/VISTARIL) tablet 50 mg  50 mg Oral Q6H PRN Thermon Leyland, NP      . Influenza vac split quadrivalent PF (FLUARIX) injection 0.5 mL  0.5 mL Intramuscular Tomorrow-1000 Rachael Fee, MD   0.5 mL at 04/08/15 1000  . LORazepam (ATIVAN) tablet 1 mg  1 mg Oral Q6H PRN Craige Cotta, MD   1 mg at 04/13/15 1156  . multivitamin with minerals tablet 1 tablet  1 tablet Oral Daily Craige Cotta, MD   1 tablet at 04/13/15 [phone]  . nicotine (NICODERM CQ - dosed in mg/24 hours) patch 21 mg  21 mg Transdermal Daily Rachael Fee, MD   21 mg at 04/13/15 [phone]  . OLANZapine (ZYPREXA) tablet 10 mg  10 mg Oral QHS Sanjuana Kava, NP   10 mg at 04/12/15 2124  . pneumococcal 23 valent vaccine (PNU-IMMUNE) injection 0.5 mL  0.5 mL Intramuscular Tomorrow-1000 Rachael Fee, MD   0.5 mL at 04/08/15 1000  . thiamine (B-1) injection 100 mg  100 mg Intramuscular Once Craige Cotta, MD   100 mg at 04/08/15 1810  . thiamine (VITAMIN B-1) tablet 100 mg  100 mg Oral Daily Craige Cotta, MD   100 mg at 04/13/15 8119  . traZODone (DESYREL) tablet 50 mg  50 mg Oral QHS PRN Kristeen Mans, NP   50 mg at 04/12/15 2124   Lab Results:  No results found for this or any previous visit (from the past 48 hour(s)).  Blood Alcohol level:  Lab Results  Component Value Date   ETH <5 04/07/2015   ETH <5 03/04/2015   Physical Findings: AIMS: Facial and Oral Movements Muscles of Facial Expression: None, normal Lips and Perioral Area: None, normal Jaw: None, normal Tongue: None, normal,Extremity Movements Upper (arms, wrists, hands, fingers): None, normal Lower (legs, knees, ankles, toes): None, normal, Trunk Movements Neck, shoulders, hips: None, normal, Overall Severity Severity of abnormal  movements (highest score from questions above): None, normal Incapacitation due to abnormal movements: None, normal Patient's awareness of abnormal movements (rate only patient's report): No Awareness, Dental Status Current problems with teeth and/or dentures?: No Does patient usually wear dentures?: No  CIWA:  CIWA-Ar Total: 1 COWS:  COWS Total Score: 3  Musculoskeletal: Strength & Muscle Tone: within normal limits Gait & Station: normal Patient leans: N/A  Psychiatric Specialty Exam: Review of Systems  Psychiatric/Behavioral: Positive for depression and suicidal ideas. Negative for hallucinations, memory loss and substance abuse. The patient is nervous/anxious. The patient does not have insomnia.    denies headache, denies chest pain, denies shortness of breath, no vomiting  Blood pressure 126/67, pulse 72, temperature 97.7 F (36.5 C), temperature source Oral, resp. rate 18, height 5' 5.5" (1.664 m), weight 64.864 kg (143 lb).Body mass index is 23.43 kg/(m^2).  General Appearance: Casual   Eye Contact::  Improved   Speech:  Normal Rate  Volume:  Normal  Mood:  At this time reports improved mood, but still depressed   Affect: more reactive at this time but as per notes irritable and negative at times, no inappropriate affect noted at this time   Thought Process:  generally linear  Orientation:  Other:  fully alert and attentive   Thought Content:  Symptoms, worries   Suicidal Thoughts:  No , at this time denies any suicidal or self injurious ideations   Homicidal Thoughts:  No  Memory:  recent and remote grossly intact   Judgement:  Fair, but is improving   Insight:   Limited   Psychomotor Activity:  Restless  Concentration:  Fair  Recall:  Good  Fund of Knowledge:Good  Language: Good  Akathisia:  Negative  Handed:  Right  AIMS (if indicated):     Assets:  Desire for Improvement Resilience  ADL's:  Fair   Cognition: WNL  Sleep:  Number of Hours: 6.75   Assessment-  patient presents partially improved - better groomed, improved affect, decrease in hallucinations. Presentation still variable, and as per staff at times irritable and negative . Denies SI. Thus far tolerating Zyprexa and Celexa well .   Treatment Plan Summary: Daily contact with patient to assess and evaluate symptoms and progress in treatment, Medication management, Plan inpatient treatment  and medications as below  Encourage milieu and group participation to work on coping skills and symptom reduction Continue Zyprexa from 15 to 10 mg QHS for psychosis, mood control Continue Celexa 20 mgrs QDAY for depression, anxiety Continue Ativan 1 mgr Q 6 hours PRN for agitation, severe anxiety as needed  Start Vistaril 50 mg every six hours prn anxiety  Continue Trazodone 50 mgrs QHS PRN for insomnia as needed. Will continue Strattera 25 mg for ADHD. Social worker to continue discharge/disposition plans. Continue current plan of care.  Fransisca KaufmannAVIS, LAURA, NP, NP-C 04/13/2015, 3:05 PM  I reviewed chart and agreed with the findings and treatment Plan.  Kathryne SharperSyed Gwenetta Devos, MD

## 2015-04-13 NOTE — [facility] Group Notes (Signed)
[facility] LCSW Group Therapy Note  04/13/2015 10:00 AM   Type of Therapy and Topic:  Group Therapy: Avoiding Self-Sabotaging and Enabling Behaviors  Participation Level:  Did Not Attend, although encouraged to do so   Summary of Patient Progress: The main focus of today's process group was to explain to the adolescent what "self-sabotage" means and use Motivational Interviewing to discuss what benefits, negative or positive, were involved in a self-identified self-sabotaging behavior. We then talked about reasons the patient may want to change the behavior and their current desire to change. A scaling question was used to help patient look at where they are now in motivation for change, using a stage of change of model.    Carney Bernatherine C Harrill, LCSW

## 2015-04-13 NOTE — Progress Notes (Signed)
Adult Psychoeducational Group Note  Date:  04/13/2015 Time:  10:24 PM  Group Topic/Focus:  Wrap-Up Group:   The focus of this group is to help patients review their daily goal of treatment and discuss progress on daily workbooks.  Participation Level:  Minimal  Participation Quality:  Appropriate  Affect:  Appropriate  Cognitive:  Alert  Insight: Appropriate  Engagement in Group:  Engaged  Modes of Intervention:  Discussion  Additional Comments:  Pt stated that today was a pretty good day. He hopes to get on medications that will actually work for him. His goal for tomorrow is to wake up early.   Kaleen OdeaCOOKE, Binh Doten R 04/13/2015, 10:24 PM

## 2015-04-13 NOTE — Progress Notes (Signed)
D) Pt has had a hight anxiety all day. Requested and received Ativan 1 mg at 1156. While in the cafeteria this writer entered and Pt was anxiously pacing and asked if he could go back to the unit before the group. States, "I can't explain it. I just don't know what it is. All I know is the thoughts get in my head and I cannot get rid of them. They just stay and stay. The only thing that makes them leave is sleep". Pt rates his depression at a 4, his hopelessness at a 0 and his anxiety at a 5.   A) Given support, reassurance and praise. Provided with a 1:1. Encouragement given. Pt allowed to leave the cafeteria early with this writer at noon. Pt also given Vistaril 50 mg at 1509 for increased anxiety and tylenol at 1621 for a headache. R) Pt stated the medications helped him to relax and not to feel so tormented with his thoughts

## 2015-04-13 NOTE — Progress Notes (Signed)
Patient has been isolative to his room asleep. He woke up long enough to take his medications and get a snack and returned to his room. He reports passive si and verbally contracts and + for voices and visual hallucinations. Support givne and safety maintained on unit with 15 min checks.

## 2015-04-14 NOTE — [facility] Group Notes (Signed)
[facility] Group Notes:  (Nursing/MHT/Case Management/Adjunct)  Date:  04/14/2015  Time:  1400  Type of Therapy:  Nurse Education  /  Life SKills:  The group focuses on teaching patients how to develop and maintain healthy support systems.  Participation Quality:  Appropriate  Affect:  Appropriate  Cognitive:  Alert  Insight:  Appropriate  Engagement in Group:  Engaged  Modes of Intervention:  Activity and Discussion  Summary of Progress/Problems:  Michael Costa, Michael Costa Michael Costa 04/14/2015, 3:58 PM

## 2015-04-14 NOTE — Progress Notes (Signed)
Patient ID: Michael Costa, male   DOB: [birth year], 25 y.o.   MRN: [id number]  [facility] MD Progress Note  04/14/2015 4:09 PM Michael Costa  MRN:  [id number]  Subjective: Michael Costa reports, "I'm not as nervous. My mind is more focused. I am not suicidal today. My depression is a five and anxiety is four. I'm eating or sleeping well. I'm doing good."   Objective : Michael Costa is seen, chart reviewed. He is alert, oriented x 3 & aware of situation. He is visible on the unit. He complains of poor focus. Requested Adderall, says it helps him focus. Admits gets Adderall off the streets. Requested a reduction in the dose of Zyprexa. Says he is not psychotic, just hears voices sometimes possibly related to illicit Adderrall usage. Denies any intent or plans to hurt himself. The patient denies hearing voices today and is easier to engage in assessment than yesterday. However, he still appears to minimize some of his symptoms. Michael Costa has a history of abusing abusing narcotic pain medications, benzodiazepines, alcohol and marijuana in addition to stimulants. He appears to have poor insight into how this could create symptoms of psychosis.   Principal Problem:  MDD,with psychotic features , consider also substance induced psychosis  Diagnosis:   Patient Active Problem List   Diagnosis Date Noted  . MDD (major depressive disorder), recurrent, severe, with psychosis (HCC) [F33.3] 04/07/2015  . MDD (major depressive disorder), single episode, moderate (HCC) [F32.1] 03/05/2015  . 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 Alcohol use disorder, mild, abuse [F10.10] 03/05/2015  . 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 Tobacco use disorder [F17.200] 03/05/2015  . 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 Polysubstance abuse     opiates, benzos, marijuana   History reviewed. No pertinent past surgical history. Family History:  Family History  Problem Relation Age of Onset  . Mental illness Neg Hx     Social History:  History  Alcohol Use  . 3.0 oz/week  . 5 Cans of beer per week     History  Drug Use  . Yes  . Special: Marijuana, Hydrocodone, Oxycodone, Benzodiazepines    Comment: pain pills, opiates, benzos    Social History   Social History  . Marital Status: Single    Spouse Name: N/A  . Number of Children: N/A  . Years of Education: N/A   Social History Main Topics  . Smoking status: Current Every Day Smoker -- 2.00 packs/day    Types: Cigarettes  . Smokeless tobacco: None  . Alcohol Use: 3.0 oz/week    5 Cans of beer per week  . Drug Use: Yes    Special: Marijuana, Hydrocodone, Oxycodone, Benzodiazepines     Comment: pain pills, opiates, benzos  . Sexual Activity: Not Currently    Birth Control/ Protection: None   Other Topics Concern  . None   Social History Narrative   Additional Social History:    Pain Medications: yes Prescriptions: reports abusing opiates (crusing and snorting them) Over the Counter: Denies History of alcohol / drug use?: Yes Longest period of sobriety (when/how long): 10 days Negative Consequences of Use: Work / Programmer, multimedia, Personal relationships Withdrawal Symptoms: Irritability   Name of Substance 2: "2 to 3 of any pain med" (snorting)- opiates 2 - Age of First Use: 20 2 -  Amount (size/oz): 2-3 of any pain med per pt 2 - Frequency: daily 2 - Duration: years 2 - Last Use / Amount: 04/04/15  Sleep: Good  Appetite:   Improving   Current Medications: Current Facility-Administered Medications  Medication Dose Route Frequency Provider Last Rate Last Dose  . acetaminophen (TYLENOL) tablet 650 mg  650 mg Oral Q6H PRN Kristeen Mans, NP   650 mg at 04/14/15 1506  . atomoxetine (STRATTERA) capsule 25 mg  25 mg Oral Daily  Sanjuana Kava, NP   25 mg at 04/14/15 1147  . citalopram (CELEXA) tablet 20 mg  20 mg Oral Daily Craige Cotta, MD   20 mg at 04/14/15 1146  . feeding supplement (ENSURE ENLIVE) (ENSURE ENLIVE) liquid 237 mL  237 mL Oral BID BM Rachael Fee, MD   237 mL at 04/14/15 1504  . hydrOXYzine (ATARAX/VISTARIL) tablet 50 mg  50 mg Oral Q6H PRN Thermon Leyland, NP   50 mg at 04/13/15 1509  . Influenza vac split quadrivalent PF (FLUARIX) injection 0.5 mL  0.5 mL Intramuscular Tomorrow-1000 Rachael Fee, MD   0.5 mL at 04/08/15 1000  . LORazepam (ATIVAN) tablet 1 mg  1 mg Oral Q6H PRN Craige Cotta, MD   1 mg at 04/14/15 1506  . multivitamin with minerals tablet 1 tablet  1 tablet Oral Daily Craige Cotta, MD   1 tablet at 04/14/15 1146  . nicotine (NICODERM CQ - dosed in mg/24 hours) patch 21 mg  21 mg Transdermal Daily Rachael Fee, MD   21 mg at 04/14/15 1148  . OLANZapine (ZYPREXA) tablet 10 mg  10 mg Oral QHS Sanjuana Kava, NP   10 mg at 04/13/15 2106  . pneumococcal 23 valent vaccine (PNU-IMMUNE) injection 0.5 mL  0.5 mL Intramuscular Tomorrow-1000 Rachael Fee, MD   0.5 mL at 04/08/15 1000  . thiamine (B-1) injection 100 mg  100 mg Intramuscular Once Craige Cotta, MD   100 mg at 04/08/15 1810  . thiamine (VITAMIN B-1) tablet 100 mg  100 mg Oral Daily Rockey Situ Cobos, MD   100 mg at 04/14/15 1146  . traZODone (DESYREL) tablet 50 mg  50 mg Oral QHS PRN Kristeen Mans, NP   50 mg at 04/13/15 2106   Lab Results:  No results found for this or any previous visit (from the past 48 hour(s)).  Blood Alcohol level:  Lab Results  Component Value Date   ETH <5 04/07/2015   ETH <5 03/04/2015   Physical Findings: AIMS: Facial and Oral Movements Muscles of Facial Expression: None, normal Lips and Perioral Area: None, normal Jaw: None, normal Tongue: None, normal,Extremity Movements Upper (arms, wrists, hands, fingers): None, normal Lower (legs, knees, ankles, toes): None, normal, Trunk  Movements Neck, shoulders, hips: None, normal, Overall Severity Severity of abnormal movements (highest score from questions above): None, normal Incapacitation due to abnormal movements: None, normal Patient's awareness of abnormal movements (rate only patient's report): No Awareness, Dental Status Current problems with teeth and/or dentures?: No Does patient usually wear dentures?: No  CIWA:  CIWA-Ar Total: 1 COWS:  COWS Total Score: 3  Musculoskeletal: Strength & Muscle Tone: within normal limits Gait & Station: normal Patient leans: N/A  Psychiatric Specialty Exam: Review of Systems  Constitutional: Negative.   HENT: Negative.   Eyes: Negative.   Respiratory: Negative.   Cardiovascular: Negative.   Gastrointestinal: Negative.   Genitourinary: Negative.   Musculoskeletal: Negative.  Skin: Negative.   Neurological: Negative.   Endo/Heme/Allergies: Negative.   Psychiatric/Behavioral: Positive for depression and suicidal ideas. Negative for hallucinations, memory loss and substance abuse. The patient is nervous/anxious. The patient does not have insomnia.    denies headache, denies chest pain, denies shortness of breath, no vomiting  Blood pressure 114/64, pulse 71, temperature 97.5 F (36.4 C), temperature source Oral, resp. rate 18, height 5' 5.5" (1.664 m), weight 64.864 kg (143 lb).Body mass index is 23.43 kg/(m^2).  General Appearance: Casual   Eye Contact::  Improved   Speech:  Normal Rate  Volume:  Normal  Mood:  At this time reports improved mood, but still depressed   Affect: more reactive at this time but as per notes irritable and negative at times, no inappropriate affect noted at this time   Thought Process:  generally linear  Orientation:  Other:  fully alert and attentive   Thought Content:  Symptoms, worries   Suicidal Thoughts:  No , at this time denies any suicidal or self injurious ideations   Homicidal Thoughts:  No  Memory:  recent and remote grossly  intact   Judgement:  Fair, but is improving   Insight:   Limited   Psychomotor Activity:  Restless  Concentration:  Fair  Recall:  Good  Fund of Knowledge:Good  Language: Good  Akathisia:  Negative  Handed:  Right  AIMS (if indicated):     Assets:  Desire for Improvement Resilience  ADL's:  Fair   Cognition: WNL  Sleep:  Number of Hours: 5.75   Assessment- patient presents partially improved - better groomed, improved affect, decrease in hallucinations. Presentation still variable, and as per staff at times irritable and negative . Denies SI. Thus far tolerating Zyprexa and Celexa well .   Treatment Plan Summary: Daily contact with patient to assess and evaluate symptoms and progress in treatment, Medication management, Plan inpatient treatment  and medications as below  Encourage milieu and group participation to work on coping skills and symptom reduction Continue Zyprexa from 15 to 10 mg QHS for psychosis, mood control Continue Celexa 20 mgrs QDAY for depression, anxiety Continue Ativan 1 mgr Q 6 hours PRN for agitation, severe anxiety as needed  Continue Vistaril 50 mg every six hours prn anxiety  Continue Trazodone 50 mgrs QHS PRN for insomnia as needed. Will continue Strattera 25 mg for ADHD as there is not potential for abuse.  Social worker to continue discharge/disposition plans. Continue current plan of care.  Fransisca KaufmannAVIS, LAURA, NP, NP-C 04/14/2015, 4:09 PM  I reviewed chart and agreed with the findings and treatment Plan.  Kathryne SharperSyed Antonetta Clanton, MD

## 2015-04-14 NOTE — [facility] Group Notes (Signed)
[facility] Group Notes:  (Nursing/MHT/Case Management/Adjunct)  Date:  04/13/2015 Time: 1315    Type of Therapy:  Nurse Education  /  Life Skills : The group focuses on teaching patients how to identify their needs as well as identify skills needed t get their needs met.  Participation Level:  Did Not Attend  Participation Quality: N /A  Affect:  N/A  Cognitive:  N/A  Insight:N/A    Engagement in Group:  N/A  Modes of Intervention:  N/A  Summary of Progress/Problems:N/a  Lauralyn Primes 04/14/2015, 10:34 AM  Late entry

## 2015-04-14 NOTE — Progress Notes (Signed)
Writer entered patients room and observed him lying in bed, in the dark. He reports that he was thinking and has a lot on his mind but did not elaborate. He reports that he still hears voices but they are very low and he can't make out what they are saying. Writer encouraged him to come to the dayroom and watch tv if feeling up to it. He requested medication for anxiety but it was too soon. He came to the dayroom and watched tv briefly later on. Support given and safety maintained on unit with 15 min checks.

## 2015-04-14 NOTE — Plan of Care (Signed)
Problem: Ineffective individual coping Goal: STG: Patient will remain free from self harm Outcome: Progressing Patient has remained free from self harm.     

## 2015-04-14 NOTE — Progress Notes (Signed)
D) Pt has been attending the groups and interacts with his peers. Affect remains flat and mood is depressed, but less so than yesterday. Requested and received tylenol for a headache at 1506. Rates his depression at a 2 hopelessness at a 0 and his anxiety. Denies SI and HI.  A) Given support, reassurance and praise. Encouragement given R) Pt denies SI and HI.

## 2015-04-14 NOTE — [facility] Group Notes (Signed)
[facility]  04/14/2015 10:00 AM   Type of Therapy:  Group Therapy  Participation Level:  Did Not Attend  Jeanelle MallingChelsea Arias Weinert, LCSW 04/14/2015 11:50 AM

## 2015-04-14 NOTE — Progress Notes (Signed)
Writer observed patient up in the dayroom more tonight than previous night. He reports that his day has been better and he is a little more talkative with Clinical research associatewriter. He currently denies si but still reports + for auditory and visual hallucinations. He is compliant with scheduled medication. Support given and safety maintained on unit with 15 min checks.

## 2015-04-15 MED ORDER — HYDROXYZINE HCL 50 MG PO TABS: 50.0000 mg | ORAL_TABLET | Freq: Four times a day (QID) | ORAL | Status: DC | PRN

## 2015-04-15 MED ORDER — CITALOPRAM HYDROBROMIDE 20 MG PO TABS: 20.0000 mg | ORAL_TABLET | Freq: Every day | ORAL | Status: DC

## 2015-04-15 MED ORDER — NICOTINE 21 MG/24HR TD PT24: 21.0000 mg | MEDICATED_PATCH | Freq: Every day | TRANSDERMAL | Status: DC

## 2015-04-15 MED ORDER — INFLUENZA VAC SPLIT QUAD 0.5 ML IM SUSY
0.5000 mL | PREFILLED_SYRINGE | INTRAMUSCULAR | Status: AC
  Administered 2015-04-15: 0.5 mL via INTRAMUSCULAR
  Filled 2015-04-15: qty 0.5

## 2015-04-15 MED ORDER — PNEUMOCOCCAL VAC POLYVALENT 25 MCG/0.5ML IJ INJ: 0.5000 mL | INJECTION | INTRAMUSCULAR | Status: DC

## 2015-04-15 MED ORDER — ATOMOXETINE HCL 40 MG PO CAPS: 40.0000 mg | ORAL_CAPSULE | Freq: Every day | ORAL | Status: DC

## 2015-04-15 MED ORDER — INFLUENZA VAC SPLIT QUAD 0.5 ML IM SUSY: 0.5000 mL | PREFILLED_SYRINGE | INTRAMUSCULAR | Status: DC

## 2015-04-15 MED ORDER — PNEUMOCOCCAL VAC POLYVALENT 25 MCG/0.5ML IJ INJ
0.5000 mL | INJECTION | INTRAMUSCULAR | Status: AC
  Administered 2015-04-15: 0.5 mL via INTRAMUSCULAR

## 2015-04-15 MED ORDER — TRAZODONE HCL 50 MG PO TABS: 50.0000 mg | ORAL_TABLET | Freq: Every evening | ORAL | Status: DC | PRN

## 2015-04-15 MED ORDER — ATOMOXETINE HCL 40 MG PO CAPS
40.0000 mg | ORAL_CAPSULE | Freq: Every day | ORAL | Status: DC
  Filled 2015-04-15 (×2): qty 1

## 2015-04-15 MED ORDER — OLANZAPINE 15 MG PO TABS: 15.0000 mg | ORAL_TABLET | Freq: Every day | ORAL | Status: DC

## 2015-04-15 MED ORDER — OLANZAPINE 7.5 MG PO TABS
15.0000 mg | ORAL_TABLET | Freq: Every day | ORAL | Status: DC
  Filled 2015-04-15 (×2): qty 2

## 2015-04-15 NOTE — B[facility] Suicide Risk Assessment (Signed)
[facility] Discharge Suicide Risk Assessment   Principal Problem: <principal problem not specified> Discharge Diagnoses:  Patient Active Problem List   Diagnosis Date Noted  . MDD (major depressive disorder), recurrent, severe, with psychosis (HCC) [F33.3] 04/07/2015  . MDD (major depressive disorder), single episode, moderate (HCC) [F32.1] 03/05/2015  . 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 Alcohol use disorder, mild, abuse [F10.10] 03/05/2015  . 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 Tobacco use disorder [F17.200] 03/05/2015  . 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 rate 16, height 5' 5.5" (1.664 m), weight 143 lb (64.864 kg).Body mass index is 23.43 kg/(m^2).  General Appearance: improved grooming   Eye Contact::  Good  Speech:  Normal Rate409  Volume:  Normal  Mood:  denies depression, states mood is " OK, happy"  Affect:  more reactive, no inappropriate affect at this time, remains vaguely anxious   Thought Process:  more linear   Orientation:  Full (Time, Place, and Person)  Thought Content:  reports decreased hallucinations, states occasionally hears voices  but " cannot make out what they say, I can ignore them now", states occasonally sees fleeting " red lights", but that these have subsided and has not experienced this in several days   Suicidal Thoughts:  No denies any suicidal ideations, denies any self injurious ideations   Homicidal Thoughts:  No denies any homicidal or violent ideations  Memory:  recent and remote grossly intact   Judgement:  Improved   Insight:  remains fair   Psychomotor Activity:  Normal  Concentration:  Good  Recall:  Good  Fund of Knowledge:Good  Language: Good  Akathisia:  Negative  Handed:  Right  AIMS (if indicated):     Assets:  Communication Skills Resilience  Sleep:  Number of Hours: 6.75  Cognition: WNL  ADL's:  Intact   Mental Status Per Nursing Assessment::   On Admission:  NA  Demographic Factors:  25 year old single male, lives with mother , currently unemployed   Loss Factors: Unemployment, limited support network   Historical Factors: One prior psychiatric admission, history of substance dependence, opiates identified as substance of choice, history of abusing stimulants , history of psychosis .  Risk Reduction Factors:   Sense of responsibility to family, Living with another person, especially a relative and Positive coping skills or problem solving skills  Continued Clinical Symptoms:  At this time patient is significantly improved compared to prior presentation- presents fully alert, attentive, improved grooming, improved eye contact,  mood is improved, denies depression , affect is more reactive, smiles appropriately, thought process more reactive, no suicidal ideations, no homicidal ideations, improved hallucinations, and as above states that at this time he can ignore hallucinations and that he is not bothered by them any more- denies any command hallucinations, does not appear internally preoccupied. Of note, patient remains focused on getting Adderall prescribed - he states he feels this would help his energy and concentration- we have reviewed rationale to avoid this type of medication at this time, based on abuse potential and potential worsening psychotic symptoms. He is now on Strattera and Zyprexa and is tolerating them well . No akathisia at this time.  We have increased Zyprexa dose to 15 mgrs QHS .  With patient's express consent I have spoken with his mother on the phone, who has visited-  she corroborates he presents significantly  improved compared to admission , and that she is in agreement of him returning home after discharge.   Cognitive Features That Contribute To Risk:  No gross cognitive deficits noted upon discharge. Is alert , attentive, and oriented x 3   Suicide Risk:  Mild:  Suicidal ideation of limited frequency, intensity, duration, and specificity.  There are no identifiable plans, no associated intent, mild dysphoria and related symptoms, good self-control (both objective and subjective assessment), few other risk factors, and identifiable protective factors, including available and accessible social support.  Follow-up Information    [facility]   Contact information:   [address] [place], [place] [address] Phone: [phone] Fax: [phone]      Plan Of Care/Follow-up recommendations:  Activity:  as tolerated  Diet:  Regular Tests:  NA Other:  See below  Patient is requesting discharge and at this time there are no grounds for involuntary commitment . He states he is returning home,  Mother will pick him up later today  Follow up as above  Nehemiah MassedOBOS, Jisele Price, MD   04/15/2015, 1:14 PM

## 2015-04-15 NOTE — Tx Team (Signed)
Interdisciplinary Treatment Plan Update (Adult) Date: 04/15/2015   Date: 04/15/2015 1:04 PM  Progress in Treatment:  Attending groups: Minimally Participating in groups: No Taking medication as prescribed: Yes  Tolerating medication: Yes  Family/Significant othe contact made: No, CSW made 2 unsuccessful contact attempts with mother Patient understands diagnosis: Yes AEB seeking help with depression and substance abuse Discussing patient identified problems/goals with staff: Yes  Medical problems stabilized or resolved: Yes  Denies suicidal/homicidal ideation: Yes Patient has not harmed self or Others: Yes   New problem(s) identified: None identified at this time.   Discharge Plan or Barriers: Pt will return to mother's house and follow-up with [facility]  Additional comments:  Patient and CSW reviewed pt's identified goals and treatment plan. Patient verbalized understanding and agreed to treatment plan. CSW reviewed [facility] "Discharge Process and Patient Involvement" Form. Pt verbalized understanding of information provided and signed form.   Reason for Continuation of Hospitalization:  Depression Medication stabilization Suicidal ideation Withdrawal symptoms Psychotic  Estimated length of stay: 0 days  Review of initial/current patient goals per problem list:   1.  Goal(s): Patient will participate in aftercare plan  Met:  Yes  Target date: 3-5 days from date of admission   As evidenced by: Patient will participate within aftercare plan AEB aftercare provider and housing plan at discharge being identified.   04/08/15: Pt will return to mother's house and follow-up with [facility] in [place]  2.  Goal (s): Patient will exhibit decreased depressive symptoms and suicidal ideations.  Met:  Yes  Target date: 3-5 days from date of admission   As evidenced by: Patient will utilize self rating of depression at 3 or below and demonstrate decreased signs of  depression or be deemed stable for discharge by MD. 04/08/15: Pt was admitted with symptoms of depression, rating 10/10. Pt continues to present with flat affect and depressive symptoms.  Pt will demonstrate decreased symptoms of depression and rate depression at 3/10 or lower prior to discharge. 04/12/15: Pt rates depression at 4/10; denies SI 04/15/15: Pt rates depression at 2/10; denies SI  4.  Goal(s): Patient will demonstrate decreased signs of withdrawal due to substance abuse  Met:  Yes  Target date: 3-5 days from date of admission   As evidenced by: Patient will produce a CIWA/COWS score of 0, have stable vitals signs, and no symptoms of withdrawal  04/08/15: Pt has COWS score of 3 and endorses anxiety and restlessness as symptoms of withdrawal.  04/12/15: Pt denies symptoms of withdrawal. 5.  Goal(s): Patient will demonstrate decreased signs of psychosis  . Met:  Adequate for DC . Target date: 3-5 days from date of admission  . As evidenced by: Patient will demonstrate decreased frequency of AVH or return to baseline function    -04/08/15: Pt recently admitted with    AVH; will continue to monitor    -04/12/15: Pt continues to endorse    AVH but expresses that it is more    Manageable.    -04/15/15: MD feels that Pt's     symptoms have decreased to the    point that they can be managed in an    outpatient setting.   Attendees:  Patient:    Family:    Physician: Dr. Sabra Heck, MD  04/15/2015 1:04 PM  Nursing: Lars Pinks, RN Case manager  04/15/2015 1:04 PM  Clinical Social Worker Peri Maris, [place] 04/15/2015 1:04 PM  Other: Tilden Fossa, LCSWA 04/15/2015 1:04 PM  Clinical:  Desma Paganini, RN; Maureen Chatters, RN  04/15/2015 1:04 PM  Other: , RN Charge Nurse 04/15/2015 1:04 PM  Other: Hilda Lias, [place], [place] Social Work [phone]

## 2015-04-15 NOTE — [facility] Suicide Risk Assessment (Signed)
[facility] INPATIENT:  Family/Significant Other Suicide Prevention Education  Suicide Prevention Education:  Contact Attempts: April Fulk, Pt's mother [phone], has been identified by the patient as the family member/significant other with whom the patient will be residing, and identified as the person(s) who will aid the patient in the event of a mental health crisis.  With written consent from the patient, two attempts were made to provide suicide prevention education, prior to and/or following the patient's discharge.  We were unsuccessful in providing suicide prevention education.  A suicide education pamphlet was given to the patient to share with family/significant other.  Date and time of first attempt: 04/10/15 @ 4:20p Date and time of second attempt: 04/15/15 @ 1:07p  Elaina Hoopsarter, Revin Corker M 04/15/2015, 1:07 PM

## 2015-04-15 NOTE — Progress Notes (Signed)
  [facility] :  Will you be returning to the same living situation after discharge:  Yes,  Pt returning to mother's house At discharge, do you have transportation home?: Yes,  Pt mother to pick up Do you have the ability to pay for your medications: Yes,  Pt provided with supply and prescriptions  Release of information consent forms completed and in the chart;  Patient's signature needed at discharge.  Patient to Follow up at: Follow-up Information    [facility].   Why:  between 8am-11am for your hospital discharge appointment. Please bring your photo ID, social security card, any insurance card if you have it, and proof of income.   Contact information:   [address] [place], [place] [address] Phone: [phone] Fax: [phone]      Next level of care provider [facility] Link:no  Safety Planning and Suicide Prevention discussed: Yes,  with Pt; two unsuccessful attempts made to contact mother  Have you used any form of tobacco in the last 30 days? (Cigarettes, Smokeless Tobacco, Cigars, and/or Pipes): Yes  Has patient been referred to the Quitline?: Patient refused referral  Patient has been referred for addiction treatment: Yes- see above  Elaina HoopsCarter, Dezerae Freiberger M 04/15/2015, 2:51 PM

## 2015-04-15 NOTE — Progress Notes (Signed)
Patient ID: Michael ClayJustin Costa, male   DOB: Mar 03, 1990, 25 y.o.   MRN: [id number]  Writer walked patient out to lobby where his ride was awaiting. Belongings returned to patient at time of discharge. No distress upon discharge. Q15 minute safety checks maintained until discharge.

## 2015-04-15 NOTE — Discharge Summary (Signed)
Physician Discharge Summary Note  Patient:  Michael Costa is an 25 y.o., male MRN:  [id number] DOB:  Oct 17, 1990 Patient phone:  [phone] (home)  Patient address:   [address] [place] [address],  Total Time spent with patient: 30 minutes  Date of Admission:  04/07/2015 Date of Discharge: 04/15/2015  Reason for Admission:  "voices, homicidal to his mother"  Principal Problem: MDD (major depressive disorder), recurrent, severe, with psychosis [facility]) Discharge Diagnoses: Patient Active Problem List   Diagnosis Date Noted  . MDD (major depressive disorder), recurrent, severe, with psychosis (HCC) [F33.3] 04/07/2015    Priority: High  . MDD (major depressive disorder), single episode, moderate (HCC) [F32.1] 03/05/2015  . 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 Alcohol use disorder, mild, abuse [F10.10] 03/05/2015  . 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 Tobacco use disorder [F17.200] 03/05/2015  . 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 Polysubstance abuse     opiates, benzos, marijuana   History reviewed. No pertinent past surgical history. Family History:  Family History  Problem Relation Age of Onset  . Mental illness Neg Hx    Family Psychiatric  History:  See above noted Social History:  History  Alcohol Use  . 3.0 oz/week  . 5 Cans of beer per week     History  Drug Use  . Yes  . Special: Marijuana, Hydrocodone, Oxycodone, Benzodiazepines    Comment: pain pills, opiates, benzos    Social History   Social History  . Marital Status: Single    Spouse Name: N/A  . Number of Children: N/A  . Years of Education: N/A   Social History Main Topics  . Smoking status: Current Every Day Smoker -- 2.00 packs/day    Types: Cigarettes   . Smokeless tobacco: None  . Alcohol Use: 3.0 oz/week    5 Cans of beer per week  . Drug Use: Yes    Special: Marijuana, Hydrocodone, Oxycodone, Benzodiazepines     Comment: pain pills, opiates, benzos  . Sexual Activity: Not Currently    Birth Control/ Protection: None   Other Topics Concern  . None   Social History Narrative    Hospital Course:  Michael Costa was admitted for MDD (major depressive disorder), recurrent, severe, with psychosis (HCC) and crisis management.  He was treated with the following medications listed below.  Michael Costa was discharged with current medication and was instructed on how to take medications as prescribed; (details listed below under Medication List).  Medical problems were identified and treated as needed.  Home medications were restarted as appropriate.  Improvement was monitored by observation and Michael Costa daily report of symptom reduction.  Emotional and mental status was monitored by daily self-inventory reports completed by Michael Costa and clinical staff.         Michael Costa was evaluated by the treatment team for stability and plans for continued recovery upon discharge.  Michael Costa motivation was an integral factor for scheduling further treatment.  Employment, transportation, bed availability, health status, family support, and any pending legal issues were also considered during her hospital stay.  She was offered further treatment options upon discharge including but not limited to Residential, Intensive Outpatient, and Outpatient treatment.  Michael Costa will follow up with the services as listed below under Follow Up  Information.     Upon completion of this admission the Michael Costa was both mentally and medically stable for discharge denying suicidal/homicidal ideation, auditory/visual/tactile hallucinations, delusional thoughts and paranoia.     Physical Findings: AIMS: Facial and Oral Movements Muscles of Facial Expression:  None, normal Lips and Perioral Area: None, normal Jaw: None, normal Tongue: None, normal,Extremity Movements Upper (arms, wrists, hands, fingers): None, normal Lower (legs, knees, ankles, toes): None, normal, Trunk Movements Neck, shoulders, hips: None, normal, Overall Severity Severity of abnormal movements (highest score from questions above): None, normal Incapacitation due to abnormal movements: None, normal Patient's awareness of abnormal movements (rate only patient's report): No Awareness, Dental Status Current problems with teeth and/or dentures?: No Does patient usually wear dentures?: No  CIWA:  CIWA-Ar Total: 1 COWS:  COWS Total Score: 3  Musculoskeletal: Strength & Muscle Tone: within normal limits Gait & Station: normal Patient leans: N/A  Psychiatric Specialty Exam:  See MD SRA Review of Systems  All other systems reviewed and are negative.   Blood pressure 121/62, pulse 83, temperature 97.4 F (36.3 C), temperature source Oral, resp. rate 16, height 5' 5.5" (1.664 m), weight 64.864 kg (143 lb).Body mass index is 23.43 kg/(m^2).  Have you used any form of tobacco in the last 30 days? (Cigarettes, Smokeless Tobacco, Cigars, and/or Pipes): Yes  Has this patient used any form of tobacco in the last 30 days? (Cigarettes, Smokeless Tobacco, Cigars, and/or Pipes) Yes, N/A  Blood Alcohol level:  Lab Results  Component Value Date   [facility] 04/07[facility]   ETH <5 03/04/2015    Metabolic Disorder Labs:  Lab Results  Component Value Date   HGBA1C 5.3 04/09/2015   MPG 105 04/09/2015   No results found for: PROLACTIN Lab Results  Component Value Date   CHOL 130 04/09/2015   TRIG 72 04/09/2015   HDL 31* 04/09/2015   CHOLHDL 4.2 04/09/2015   VLDL 14 04/09/2015   LDLCALC 85 04/09/2015    See Psychiatric Specialty Exam and Suicide Risk Assessment completed by Attending Physician prior to discharge.  Discharge destination:  Home  Is patient on multiple antipsychotic  therapies at discharge:  No   Has Patient had three or more failed trials of antipsychotic monotherapy by history:  No  Recommended Plan for Multiple Antipsychotic Therapies: NA     Medication List    TAKE these medications      Indication   atomoxetine 40 MG capsule  Commonly known as:  STRATTERA  Take 1 capsule (40 mg total) by mouth daily.  Start taking on:  04/16/2015   Indication:  Attention Deficit Hyperactivity Disorder     citalopram 20 MG tablet  Commonly known as:  CELEXA  Take 1 tablet (20 mg total) by mouth daily.   Indication:  depression     hydrOXYzine 50 MG tablet  Commonly known as:  ATARAX/VISTARIL  Take 1 tablet (50 mg total) by mouth every 6 (six) hours as needed for anxiety.   Indication:  Anxiety Neurosis     nicotine 21 mg/24hr patch  Commonly known as:  NICODERM CQ - dosed in mg/24 hours  Place 1 patch (21 mg total) onto the skin daily.   Indication:  Nicotine Addiction     OLANZapine 15 MG tablet  Commonly known as:  ZYPREXA  Take 1 tablet (15 mg total) by mouth at bedtime.   Indication:  Mood control     traZODone 50 MG tablet  Commonly known as:  DESYREL  Take 1 tablet (50 mg total) by mouth at bedtime as needed for sleep.   Indication:  Trouble Sleeping       Follow-up Information    Follow up with [facility].   Contact information:   [address] [place], [place] [address] Phone: [phone] Fax: [phone]      Follow-up recommendations:  Activity:  as tol Diet:  as tol  Comments:  1.  Take all your medications as prescribed.              2.  Report any adverse side effects to outpatient provider.                       3.  Patient instructed to not use alcohol or illegal drugs while on prescription medicines.            4.  In the event of worsening symptoms, instructed patient to call 911, the crisis hotline or go to nearest emergency room for evaluation of symptoms.  Signed: Lindwood Qua, NP [facility] 04/15/2015,  1:26 PM   Patient seen, Suicide Assessment Completed.  Disposition Plan Reviewed

## 2015-04-15 NOTE — Progress Notes (Signed)
All D/C instructions reviewed with pt and he acknowledged understanding. Prescriptions given to pt. Pt denies si and hi.

## 2015-04-15 NOTE — Progress Notes (Signed)
Recreation Therapy Notes  Date: 03.13.2017 Time: 9:30am  Location: [address] Group Room   Group Topic: Stress Management  Goal Area(s) Addresses:  Patient will actively participate in stress management techniques presented during session.   Behavioral Response: Did not attend.   Izaan Kingbird L Levon Penning, LRT/CTRS        Faelyn Sigler L 04/15/2015 1:47 PM 

## 2015-04-15 NOTE — [facility] Group Notes (Signed)
[facility]  04/15/2015 8:45 AM  Participation Quality: Alert, Appropriate and Oriented  Mood/Affect: Blunted  Depression Rating: 2  Anxiety Rating: 2  Thoughts of Suicide: Pt denies SI/HI  Will you contract for safety? Yes  Current AVH: Pt denies  Plan for Discharge/Comments: Pt attended discharge planning group and actively participated in group. CSW discussed suicide prevention education with the group and encouraged them to discuss discharge planning and any relevant barriers. Pt reports that he is feeling better and wants to go home. However, Pt continues to appear confused at times with difficulty processing information.  Transportation Means: Pt reports access to transportation  Supports: No supports mentioned at this time  Chad CordialLauren Carter, LCSWA 04/15/2015 10:26 AM

## 2015-04-26 ENCOUNTER — Encounter (HOSPITAL_COMMUNITY): Payer: Self-pay | Admitting: Emergency Medicine

## 2015-04-26 ENCOUNTER — Emergency Department (HOSPITAL_COMMUNITY)
Admission: EM | Admit: 2015-04-26 | Discharge: 2015-04-27 | Disposition: A | Discharge status: Home / Self Care | Payer: Self-pay | Attending: Emergency Medicine | Admitting: Emergency Medicine

## 2015-04-26 DIAGNOSIS — Z79899 Other long term (current) drug therapy: Secondary | ICD-10-CM | POA: Insufficient documentation

## 2015-04-26 DIAGNOSIS — F1010 Alcohol abuse, uncomplicated: Secondary | ICD-10-CM | POA: Insufficient documentation

## 2015-04-26 DIAGNOSIS — F1120 Opioid dependence, uncomplicated: Secondary | ICD-10-CM | POA: Insufficient documentation

## 2015-04-26 DIAGNOSIS — F329 Major depressive disorder, single episode, unspecified: Secondary | ICD-10-CM | POA: Insufficient documentation

## 2015-04-26 DIAGNOSIS — F32A Depression, unspecified: Secondary | ICD-10-CM

## 2015-04-26 DIAGNOSIS — F17210 Nicotine dependence, cigarettes, uncomplicated: Secondary | ICD-10-CM | POA: Insufficient documentation

## 2015-04-26 LAB — CBC
HCT: 41.7 % (ref 39.0–52.0)
Hemoglobin: 14 g/dL (ref 13.0–17.0)
MCH: 30 pg (ref 26.0–34.0)
MCHC: 33.6 g/dL (ref 30.0–36.0)
MCV: 89.5 fL (ref 78.0–100.0)
PLATELETS: 323 10*3/uL (ref 150–400)
RBC: 4.66 MIL/uL (ref 4.22–5.81)
RDW: 13.3 % (ref 11.5–15.5)
WBC: 9.8 10*3/uL (ref 4.0–10.5)

## 2015-04-26 LAB — RAPID URINE DRUG SCREEN, HOSP PERFORMED
Amphetamines: NOT DETECTED
Barbiturates: NOT DETECTED
Benzodiazepines: NOT DETECTED
COCAINE: NOT DETECTED
OPIATES: NOT DETECTED
Tetrahydrocannabinol: NOT DETECTED

## 2015-04-26 LAB — ACETAMINOPHEN LEVEL

## 2015-04-26 LAB — COMPREHENSIVE METABOLIC PANEL
ALK PHOS: 50 U/L (ref 38–126)
ALT: 62 U/L (ref 17–63)
ANION GAP: 12 (ref 5–15)
AST: 43 U/L — ABNORMAL HIGH (ref 15–41)
Albumin: 4.2 g/dL (ref 3.5–5.0)
BILIRUBIN TOTAL: 0.4 mg/dL (ref 0.3–1.2)
BUN: 10 mg/dL (ref 6–20)
CALCIUM: 9.3 mg/dL (ref 8.9–10.3)
CO2: 24 mmol/L (ref 22–32)
Chloride: 103 mmol/L (ref 101–111)
Creatinine, Ser: 0.86 mg/dL (ref 0.61–1.24)
GFR calc non Af Amer: >60 mL/min (ref >60)
Glucose, Bld: 96 mg/dL (ref 65–99)
POTASSIUM: 3.7 mmol/L (ref 3.5–5.1)
SODIUM: 139 mmol/L (ref 135–145)
TOTAL PROTEIN: 7.2 g/dL (ref 6.5–8.1)

## 2015-04-26 LAB — ETHANOL: Alcohol, Ethyl (B): 67 mg/dL — ABNORMAL HIGH (ref <5)

## 2015-04-26 LAB — SALICYLATE LEVEL

## 2015-04-26 MED ORDER — ONDANSETRON 4 MG PO TBDP: 4.0000 mg | ORAL_TABLET | Freq: Four times a day (QID) | ORAL | Status: DC | PRN

## 2015-04-26 MED ORDER — CLONIDINE HCL 0.1 MG PO TABS
0.1000 mg | ORAL_TABLET | Freq: Four times a day (QID) | ORAL | Status: DC
  Administered 2015-04-26: 0.1 mg via ORAL
  Filled 2015-04-26: qty 1

## 2015-04-26 MED ORDER — CLONIDINE HCL 0.1 MG PO TABS: 0.1000 mg | ORAL_TABLET | ORAL | Status: DC

## 2015-04-26 MED ORDER — DICYCLOMINE HCL 20 MG PO TABS: 20.0000 mg | ORAL_TABLET | Freq: Four times a day (QID) | ORAL | Status: DC | PRN

## 2015-04-26 MED ORDER — CLONIDINE HCL 0.1 MG PO TABS: 0.1000 mg | ORAL_TABLET | Freq: Every day | ORAL | Status: DC

## 2015-04-26 MED ORDER — HYDROXYZINE HCL 25 MG PO TABS: 25.0000 mg | ORAL_TABLET | Freq: Four times a day (QID) | ORAL | Status: DC | PRN

## 2015-04-26 MED ORDER — METHOCARBAMOL 500 MG PO TABS: 500.0000 mg | ORAL_TABLET | Freq: Three times a day (TID) | ORAL | Status: DC | PRN

## 2015-04-26 MED ORDER — NAPROXEN 250 MG PO TABS: 500.0000 mg | ORAL_TABLET | Freq: Two times a day (BID) | ORAL | Status: DC | PRN

## 2015-04-26 MED ORDER — NICOTINE 21 MG/24HR TD PT24: 21.0000 mg | MEDICATED_PATCH | Freq: Every day | TRANSDERMAL | Status: DC

## 2015-04-26 MED ORDER — LOPERAMIDE HCL 2 MG PO CAPS: 2.0000 mg | ORAL_CAPSULE | ORAL | Status: DC | PRN

## 2015-04-26 NOTE — ED Provider Notes (Signed)
CSN: [id number]     Arrival date & time 04/26/15  2057 History   First MD Initiated Contact with Patient 04/26/15 2133     Chief Complaint  Patient presents with  . Suicidal  . Medical Clearance    The history is provided by the patient.  Patient presents for substance abuse treatment. States he's been abusing mostly opioids. He used several doses of opiates last night. States he's also been depressed but is not frankly suicidal. He's had recent treatment for the same. States he had previously been hallucinating but that has stopped since the last treatment. Patient states he did not follow-up as planned.  Past Medical History  Diagnosis Date  . Polysubstance abuse     opiates, benzos, marijuana   History reviewed. No pertinent past surgical history. Family History  Problem Relation Age of Onset  . Mental illness Neg Hx    Social History  Substance Use Topics  . Smoking status: Current Every Day Smoker -- 2.00 packs/day    Types: Cigarettes  . Smokeless tobacco: None  . Alcohol Use: 3.0 oz/week    5 Cans of beer per week    Review of Systems  Constitutional: Negative for activity change and appetite change.  Eyes: Negative for pain.  Respiratory: Negative for chest tightness and shortness of breath.   Cardiovascular: Negative for chest pain and leg swelling.  Gastrointestinal: Negative for nausea, vomiting, abdominal pain and diarrhea.  Genitourinary: Negative for flank pain.  Musculoskeletal: Negative for back pain and neck stiffness.  Skin: Negative for rash.  Neurological: Negative for weakness, numbness and headaches.  Psychiatric/Behavioral: Positive for suicidal ideas. Negative for behavioral problems.      Allergies  Adhesive  Home Medications   Prior to Admission medications   Medication Sig Start Date End Date Taking? Authorizing Provider  atomoxetine (STRATTERA) 40 MG capsule Take 1 capsule (40 mg total) by mouth daily. 04/16/15  Yes Adonis Brook, NP   citalopram (CELEXA) 20 MG tablet Take 1 tablet (20 mg total) by mouth daily. 04/15/15  Yes Adonis Brook, NP  hydrOXYzine (ATARAX/VISTARIL) 50 MG tablet Take 1 tablet (50 mg total) by mouth every 6 (six) hours as needed for anxiety. 04/15/15  Yes Adonis Brook, NP  OLANZapine (ZYPREXA) 15 MG tablet Take 1 tablet (15 mg total) by mouth at bedtime. 04/15/15  Yes Adonis Brook, NP  traZODone (DESYREL) 50 MG tablet Take 1 tablet (50 mg total) by mouth at bedtime as needed for sleep. 04/15/15  Yes Adonis Brook, NP  nicotine (NICODERM CQ - DOSED IN MG/24 HOURS) 21 mg/24hr patch Place 1 patch (21 mg total) onto the skin daily. 04/15/15   Adonis Brook, NP   BP 115/71 mmHg  Pulse 109  Temp(Src) 98.1 F (36.7 C) (Oral)  Resp 18  SpO2 96% Physical Exam  Constitutional: He is oriented to person, place, and time. He appears well-developed and well-nourished.  HENT:  Head: Normocephalic and atraumatic.  Eyes: Pupils are equal, round, and reactive to light.  Neck: Normal range of motion.  Cardiovascular: Normal rate and regular rhythm.   No murmur heard. Pulmonary/Chest: Effort normal and breath sounds normal.  Abdominal: Soft. Bowel sounds are normal. He exhibits no distension.  Musculoskeletal: Normal range of motion.  Neurological: He is alert and oriented to person, place, and time.  Skin: Skin is warm and dry.  Psychiatric: He has a normal mood and affect.  Nursing note and vitals reviewed.   ED Course  Procedures (including critical care  time) Labs Review Labs Reviewed  COMPREHENSIVE METABOLIC PANEL - Abnormal; Notable for the following:    AST 43 (*)    All other components within normal limits  ETHANOL - Abnormal; Notable for the following:    Alcohol, Ethyl (B) 67 (*)    All other components within normal limits  ACETAMINOPHEN LEVEL - Abnormal; Notable for the following:    Acetaminophen (Tylenol), Serum <10 (*)    All other components within normal limits  SALICYLATE LEVEL   CBC  URINE RAPID DRUG SCREEN, HOSP PERFORMED    Imaging Review No results found. I have personally reviewed and evaluated these images and lab results as part of my medical decision-making.   EKG Interpretation None      MDM   Final diagnoses:  Opioid use disorder, moderate, dependence (HCC)  Alcohol use disorder, mild, abuse  Depression    Patient presents for possible psychiatric placement. Mostly opiate abuse with a little bit of benzo mixed in. Has some passive suicidal thoughts. He is not followed up as he was supposed to. At this point appears to medically cleared. To be seen by TTS.    Benjiman CoreNathan Jaylene Arrowood, MD 04/26/15 2234

## 2015-04-26 NOTE — ED Notes (Signed)
Pt wanded. All clear.

## 2015-04-26 NOTE — ED Notes (Signed)
Security and staffing have been notified.  Sitter will be on the way after break and security is on the way to wand pt.

## 2015-04-26 NOTE — ED Notes (Signed)
Pt states "Im on pills, oxycodone, xanax, nerve pills, hydrocode". Pt states "I took seven oxycodone's, 4 of them were 10mg s and 3 of them 7.5mg ." Pt states "I took them last night, spread out over the night". Pt took 1 10mg  oxycodone today. Pt endorses drinking alcohol. Two 16oz beers. Pt states "I feel depressed and angry". Pt states "sometimes i wanna hurt myself". Pt has no plan. Pt family member states "Hes seeing things and hearing voices". Pt states "I haven't heard or seen anything in a while".

## 2015-04-26 NOTE — ED Notes (Signed)
MD at bedside. 

## 2015-04-26 NOTE — ED Notes (Signed)
Pt gave cellphone to April Fulp, mother.

## 2015-04-27 NOTE — ED Notes (Signed)
TTS in progress 

## 2015-04-27 NOTE — ED Notes (Signed)
Pt verbalized understanding of discharge instructions. Pt discharged to the lobby to wait for mother.

## 2015-04-27 NOTE — ED Provider Notes (Signed)
1:20 AM  D/w Berna SpareMarcus with TTS.  Pt is a 25 y.o. male with history of substance abuse, depression who presents to the emergency department for her request to detox from opiates. Has had suicidal thoughts in the past but none currently. Has had previous hallucinations but none currently. He has been evaluatedi TTS. They do not feel he needs inpatient criteria. They feel he can be discharged with outpatient resources. I have talked to patient and he is comfortable with this plan. He contracts for safety. Denies SI, HI or hallucinations. Denies medical complaints. We'll discharge with outpatient resources. Discussed return precautions.  Layla MawKristen N Juwana Thoreson, DO 04/27/15 0121

## 2015-04-27 NOTE — BH Assessment (Signed)
Tele Assessment Note   Michael Costa is an 25 y.o. male.  -Clinician reviewed note by Dr. Rubin PayorPickering.  Patient presents for substance abuse treatment. States he's been abusing mostly opioids. He used several doses of opiates last night. States he's also been depressed but is not frankly suicidal. He's had recent treatment for the same. States he had previously been hallucinating but that has stopped since the last treatment. Patient states he did not follow-up as planned.  Patient denies any current SI.  He says he has felt that way in the past.  Patient also denies any HI or A/V hallucinations.  [name] and was instructed to follow up with [name].  Patient did not do so.  He went back to using opiates.  Patient reports using about seven of the 10mg  Oxycodone since around 03/16 to today.  Patient says he has been depressed with his relapse and letting others down.  He has depression and some anxiety over his drug use.  [name].  -Clinician discussed patient care with Assunta FoundShuvon Rankin, NP who said that patient did not meet inpatient criteria at this time.  Clinician agreed.  Clinician had mentioned to patient that he may be discharged and follow up with [facility] for a rehab bed on his own.  Patient had said that would be okay but [name].  Clinician talked to Dr. Elesa MassedWard and let her know about the disposition.  She said that she would talk to patient about going to [name] his own.  Diagnosis: Opiate use d/o moderate  Past Medical History:  Past Medical History  Diagnosis Date  . Polysubstance abuse     opiates, benzos, marijuana    History reviewed. No pertinent past surgical history.  Family History:  Family History  Problem Relation Age of Onset  . Mental illness Neg Hx     Social History:  reports that he has been smoking Cigarettes.  He has been smoking about 2.00 packs per day. He does not have any  smokeless tobacco history on file. He reports that he drinks about 3.0 oz of alcohol per week. He reports that he uses illicit drugs (Marijuana, Hydrocodone, Oxycodone, and Benzodiazepines).  Additional Social History:  Alcohol / Drug Use Pain Medications: Oxycodone & Hydrocodone are being abused Prescriptions: [name].  Did not get Strattera filled because price is high Over the Counter: None History of alcohol / drug use?: Yes Substance #2 Name of Substance 2: Oxycodone & Hydrocodone 2 - Age of First Use: 25 years of age 27 - Amount (size/oz): Oxycodone 10mg  (has been using about 7 of them) 2 - Frequency: Daily use of about seven pills 2 - Duration: Last week at that rate.  [name] 2 - Last Use / Amount: Used one 10mg  oxycodone today 03/24. Substance #3 Name of Substance 3: Valum or xanax 3 - Age of First Use: Teens 3 - Amount (size/oz): Will use "no very much" 3 - Frequency: Once in awhile" this means 1-2 times per month for this patient 3 - Duration: On-going 3 - Last Use / Amount: "Few days ago."  CIWA: CIWA-Ar BP: 124/66 mmHg Pulse Rate: 94 Nausea and Vomiting: no nausea and no vomiting Tactile Disturbances: none Tremor: no tremor Auditory Disturbances: not present Paroxysmal Sweats: no sweat visible Visual Disturbances: not present Anxiety: mildly anxious Headache, Fullness in  Head: none present Agitation: normal activity Orientation and Clouding of Sensorium: oriented and can do serial additions CIWA-Ar Total: 1 COWS: Clinical Opiate Withdrawal Scale (COWS) Resting Pulse Rate: Pulse Rate 81-100 Sweating: No report of chills or flushing Restlessness: Able to sit still Pupil Size: Pupils pinned or normal size for room light Bone or Joint Aches: Mild diffuse discomfort Runny Nose or Tearing: Not present GI Upset: No GI symptoms Tremor: No tremor Yawning: No yawning Anxiety or Irritability: None Gooseflesh Skin: Skin is  smooth COWS Total Score: 2  PATIENT STRENGTHS: (choose at least two) Average or above average intelligence Communication skills Supportive family/friends  Allergies:  Allergies  Allergen Reactions  . Adhesive [Tape]     sensitivity    Home Medications:  (Not in a hospital admission)  OB/GYN Status:  No LMP for male patient.  General Assessment Data Location of Assessment: [facility] ED TTS Assessment: In system Is this a Tele or Face-to-Face Assessment?: Tele Assessment Is this an Initial Assessment or a Re-assessment for this encounter?: Initial Assessment Marital status: Single Is patient pregnant?: No Pregnancy Status: No Living Arrangements: Parent (Lives w/ mother, niece, sister & sister's gf) Can pt return to current living arrangement?: Yes Admission Status: Voluntary Is patient capable of signing voluntary admission?: Yes Referral Source: Self/Family/Friend Insurance type: self pay     Crisis Care Plan Living Arrangements: Parent (Lives w/ mother, niece, sister & sister's gf) Name of Psychiatrist: None Name of Therapist: None  Education Status Is patient currently in school?: No Highest grade of school patient has completed: Through the 10th grade  Risk to self with the past 6 months Suicidal Ideation: No-Not Currently/Within Last 6 Months Has patient been a risk to self within the past 6 months prior to admission? : No Suicidal Intent: No-Not Currently/Within Last 6 Months Has patient had any suicidal intent within the past 6 months prior to admission? : No Is patient at risk for suicide?: No Suicidal Plan?: No Has patient had any suicidal plan within the past 6 months prior to admission? : No Access to Means: No Specify Access to Suicidal Means: None What has been your use of drugs/alcohol within the last 12 months?: Opiates Previous Attempts/Gestures: No How many times?: 0 Other Self Harm Risks: No Triggers for Past Attempts: None known Intentional Self  Injurious Behavior: None Family Suicide History: No Recent stressful life event(s): Turmoil (Comment) (Turmoil in life due to drug use.) Persecutory voices/beliefs?: No Depression: Yes Depression Symptoms: Despondent Substance abuse history and/or treatment for substance abuse?: Yes Suicide prevention information given to non-admitted patients: Not applicable  Risk to Others within the past 6 months Homicidal Ideation: No Does patient have any lifetime risk of violence toward others beyond the six months prior to admission? : No Thoughts of Harm to Others: No Comment - Thoughts of Harm to Others: None Current Homicidal Intent: No Current Homicidal Plan: No Access to Homicidal Means: No Describe Access to Homicidal Means: None Identified Victim: No one History of harm to others?: No Assessment of Violence: None Noted Violent Behavior Description: Pt denies Does patient have access to weapons?: No Criminal Charges Pending?: Yes Describe Pending Criminal Charges: pt DUI Does patient have a court date: Yes Court Date: 05/30/15 Is patient on probation?: No  Psychosis Hallucinations: None noted Delusions: None noted  Mental Status Report Appearance/Hygiene: Unremarkable, In scrubs Eye Contact: Fair Motor Activity: Freedom of movement, Restlessness Speech: Logical/coherent Level of Consciousness: Alert Mood: Anxious, Sad Affect: Anxious, Depressed Anxiety Level: Minimal  Thought Processes: Coherent, Relevant Judgement: Impaired Orientation: Person, Place, Time, Situation, Appropriate for developmental age Obsessive Compulsive Thoughts/Behaviors: None  Cognitive Functioning Concentration: Poor Memory: Remote Intact, Recent Intact IQ: Average Insight: Fair Impulse Control: Poor Appetite: Fair Weight Loss: 0 Weight Gain: 0 Sleep: Increased Total Hours of Sleep: 10 Vegetative Symptoms: Staying in bed  ADLScreening [facility] Assessment Services) Patient's cognitive ability  adequate to safely complete daily activities?: Yes Patient able to express need for assistance with ADLs?: Yes Independently performs ADLs?: Yes (appropriate for developmental age)  Prior Inpatient Therapy Prior Inpatient Therapy: Yes Prior Therapy Dates: 03/06-03/13; 03/04/15-02//02/17 Prior Therapy Facilty/Provider(s): [facility] Reason for Treatment: MDD, substance abuse  Prior Outpatient Therapy Prior Outpatient Therapy: Yes Prior Therapy Dates: 03/2015 Prior Therapy Facilty/Provider(s): [facility] Reason for Treatment: MDD, substance abuse Does patient have an ACCT team?: No Does patient have Intensive In-House Services?  : No Does patient have [facility] services? : No Does patient have P4CC services?: No  ADL Screening (condition at time of admission) Patient's cognitive ability adequate to safely complete daily activities?: Yes Is the patient deaf or have difficulty hearing?: No Does the patient have difficulty seeing, even when wearing glasses/contacts?: No Does the patient have difficulty concentrating, remembering, or making decisions?: Yes Patient able to express need for assistance with ADLs?: Yes Does the patient have difficulty dressing or bathing?: No Independently performs ADLs?: Yes (appropriate for developmental age) Does the patient have difficulty walking or climbing stairs?: No Weakness of Legs: None Weakness of Arms/Hands: None       Abuse/Neglect Assessment (Assessment to be complete while patient is alone) Physical Abuse: Denies Verbal Abuse: Denies Sexual Abuse: Denies Exploitation of patient/patient's resources: Denies Self-Neglect: Denies     Merchant navy officer (For Healthcare) Does patient have an advance directive?: No Would patient like information on creating an advanced directive?: No - patient declined information    Additional Information 1:1 In Past 12 Months?: No CIRT Risk: No Elopement Risk: No Does patient have medical clearance?: Yes      Disposition:  Disposition Initial Assessment Completed for this Encounter: Yes Disposition of Patient: Other dispositions Type of inpatient treatment program: Adult Other disposition(s): Other (Comment) (To be run by NP)  [facility] 04/27/2015 12:36 AM

## 2015-04-27 NOTE — Discharge Instructions (Signed)
Alcohol Use Disorder °Alcohol use disorder is a mental disorder. It is not a one-time incident of heavy drinking. Alcohol use disorder is the excessive and uncontrollable use of alcohol over time that leads to problems with functioning in one or more areas of daily living. People with this disorder risk harming themselves and others when they drink to excess. Alcohol use disorder also can cause other mental disorders, such as mood and anxiety disorders, and serious physical problems. People with alcohol use disorder often misuse other drugs.  °Alcohol use disorder is common and widespread. Some people with this disorder drink alcohol to cope with or escape from negative life events. Others drink to relieve chronic pain or symptoms of mental illness. People with a family history of alcohol use disorder are at higher risk of losing control and using alcohol to excess.  °Drinking too much alcohol can cause injury, accidents, and health problems. One drink can be too much when you are: °· Working. °· Pregnant or breastfeeding. °· Taking medicines. Ask your doctor. °· Driving or planning to drive. °SYMPTOMS  °Signs and symptoms of alcohol use disorder may include the following:  °· Consumption of alcohol in larger amounts or over a longer period of time than intended. °· Multiple unsuccessful attempts to cut down or control alcohol use.   °· A great deal of time spent obtaining alcohol, using alcohol, or recovering from the effects of alcohol (hangover). °· A strong desire or urge to use alcohol (cravings).   °· Continued use of alcohol despite problems at work, school, or home because of alcohol use.   °· Continued use of alcohol despite problems in relationships because of alcohol use. °· Continued use of alcohol in situations when it is physically hazardous, such as driving a car. °· Continued use of alcohol despite awareness of a physical or psychological problem that is likely related to alcohol use. Physical  problems related to alcohol use can involve the brain, heart, liver, stomach, and intestines. Psychological problems related to alcohol use include intoxication, depression, anxiety, psychosis, delirium, and dementia.   °· The need for increased amounts of alcohol to achieve the same desired effect, or a decreased effect from the consumption of the same amount of alcohol (tolerance). °· Withdrawal symptoms upon reducing or stopping alcohol use, or alcohol use to reduce or avoid withdrawal symptoms. Withdrawal symptoms include: °· Racing heart. °· Hand tremor. °· Difficulty sleeping. °· Nausea. °· Vomiting. °· Hallucinations. °· Restlessness. °· Seizures. °DIAGNOSIS °Alcohol use disorder is diagnosed through an assessment by your health care provider. Your health care provider may start by asking three or four questions to screen for excessive or problematic alcohol use. To confirm a diagnosis of alcohol use disorder, at least two symptoms must be present within a 12-month period. The severity of alcohol use disorder depends on the number of symptoms: °· Mild--two or three. °· Moderate--four or five. °· Severe--six or more. °Your health care provider may perform a physical exam or use results from lab tests to see if you have physical problems resulting from alcohol use. Your health care provider may refer you to a mental health professional for evaluation. °TREATMENT  °Some people with alcohol use disorder are able to reduce their alcohol use to low-risk levels. Some people with alcohol use disorder need to quit drinking alcohol. When necessary, mental health professionals with specialized training in substance use treatment can help. Your health care provider can help you decide how severe your alcohol use disorder is and what type of treatment you need.   The following forms of treatment are available:   Detoxification. Detoxification involves the use of prescription medicines to prevent alcohol withdrawal  symptoms in the first week after quitting. This is important for people with a history of symptoms of withdrawal and for heavy drinkers who are likely to have withdrawal symptoms. Alcohol withdrawal can be dangerous and, in severe cases, cause death. Detoxification is usually provided in a hospital or in-patient substance use treatment facility.  Counseling or talk therapy. Talk therapy is provided by substance use treatment counselors. It addresses the reasons people use alcohol and ways to keep them from drinking again. The goals of talk therapy are to help people with alcohol use disorder find healthy activities and ways to cope with life stress, to identify and avoid triggers for alcohol use, and to handle cravings, which can cause relapse.  Medicines.Different medicines can help treat alcohol use disorder through the following actions:  Decrease alcohol cravings.  Decrease the positive reward response felt from alcohol use.  Produce an uncomfortable physical reaction when alcohol is used (aversion therapy).  Support groups. Support groups are run by people who have quit drinking. They provide emotional support, advice, and guidance. These forms of treatment are often combined. Some people with alcohol use disorder benefit from intensive combination treatment provided by specialized substance use treatment centers. Both inpatient and outpatient treatment programs are available.   This information is not intended to replace advice given to you by your health care provider. Make sure you discuss any questions you have with your health care provider.   Document Released: 02/27/2004 Document Revised: 02/09/2014 Document Reviewed: 04/28/2012 Elsevier Interactive Patient Education 2016 Elsevier Inc.  Opioid Use Disorder Opioid use disorder is a mental disorder. It is the continued nonmedical use of opioids in spite of risks to health and well-being. Misused opioids include the street drug  heroin. They also include pain medicines such as morphine, hydrocodone, oxycodone, and fentanyl. Opioids are very addictive. People who misuse opioids get an exaggerated feeling of well-being. Opioid use disorder often disrupts activities at home, work, or school. It may cause mental or physical problems.  A family history of opioid use disorder puts you at higher risk of it. People with opioid use disorder often misuse other drugs or have mental illness such as depression, posttraumatic stress disorder, or antisocial personality disorder. They also are at risk of suicide and death from overdose. SIGNS AND SYMPTOMS  Signs and symptoms of opioid use disorder include:  Use of opioids in larger amounts or over a longer period than intended.  Unsuccessful attempts to cut down or control opioid use.  A lot of time spent obtaining, using, or recovering from the effects of opioids.  A strong desire or urge to use opioids (craving).  Continued use of opioids in spite of major problems at work, school, or home because of use.  Continued use of opioids in spite of relationship problems because of use.  Giving up or cutting down on important life activities because of opioid use.  Use of opioids over and over in situations when it is physically hazardous, such as driving a car.  Continued use of opioids in spite of a physical problem that is likely related to use. Physical problems can include:  Severe constipation.  Poor nutrition.  Infertility.  Tuberculosis.  Aspiration pneumonia.  Infections such as human immunodeficiency virus (HIV) and hepatitis (from injecting opioids).  Continued use of opioids in spite of a mental problem that is likely related  to use. Mental problems can include:  Depression.  Anxiety.  Hallucinations.  Sleep problems.  Loss of sexual function.  Need to use more and more opioids to get the same effect, or lessened effect over time with use of the same  amount (tolerance).  Having withdrawal symptoms when opioid use is stopped, or using opioids to reduce or avoid withdrawal symptoms. Withdrawal symptoms include:  Depressed, anxious, or irritable mood.  Nausea, vomiting, diarrhea, or intestinal cramping.  Muscle aches or spasms.  Excessive tearing or runny nose.  Dilated pupils, sweating, or hairs standing on end.  Yawning.  Fever, raised blood pressure, or fast pulse.  Restlessness or trouble sleeping. This does not apply to people taking opioids for medical reasons only. DIAGNOSIS Opioid use disorder is diagnosed by your health care provider. You may be asked questions about your opioid use and and how it affects your life. A physical exam may be done. A drug screen may be ordered. You may be referred to a mental health professional. The diagnosis of opioid use disorder requires at least two symptoms within 12 months. The type of opioid use disorder you have depends on the number of signs and symptoms you have. The type may be:  Mild. Two or three signs and symptoms.   Moderate. Four or five signs and symptoms.   Severe. Six or more signs and symptoms. TREATMENT  Treatment is usually provided by mental health professionals with training in substance use disorders.The following options are available:  Detoxification.This is the first step in treatment for withdrawal. It is medically supervised withdrawal with the use of medicines. These medicines lessen withdrawal symptoms. They also raise the chance of becoming opioid free.  Counseling, also known as talk therapy. Talk therapy addresses the reasons you use opioids. It also addresses ways to keep you from using again (relapse). The goals of talk therapy are to avoid relapse by:  Identifying and avoiding triggers for use.  Finding healthy ways to cope with stress.  Learning how to handle cravings.  Support groups. Support groups provide emotional support, advice, and  guidance.  A medicine that blocks opioid receptors in your brain. This medicine can reduce opioid cravings that lead to relapse. This medicine also blocks the desired opioid effect when relapse occurs.  Opioids that are taken by mouth in place of the misused opioid (opioid maintenance treatment). These medicines satisfy cravings but are safer than commonly misused opioids. This often is the best option for people who continue to relapse with other treatments. HOME CARE INSTRUCTIONS   Take medicines only as directed by your health care provider.  Check with your health care provider before starting new medicines.  Keep all follow-up visits as directed by your health care provider. SEEK MEDICAL CARE IF:  You are not able to take your medicines as directed.  Your symptoms get worse. SEEK IMMEDIATE MEDICAL CARE IF:  You have serious thoughts about hurting yourself or others.  You may have taken an overdose of opioids. FOR MORE INFORMATION  National Institute on Drug Abuse: http://www.price-Legendre.com/  Substance Abuse and Mental Health Services Administration: SkateOasis.com.pt   This information is not intended to replace advice given to you by your health care provider. Make sure you discuss any questions you have with your health care provider.   Document Released: 11/16/2006 Document Revised: 02/09/2014 Document Reviewed: 02/01/2013 Elsevier Interactive Patient Education 2016 [employer].  [employer] Guide Outpatient Counseling/Substance Abuse Adult The United Ways 211 is a great source of  information about community services available.  Access by dialing 2-1-1 from anywhere in [place], or by website -  PooledIncome.pl.   Other Local Resources (Updated 02/2015)  Crisis Hotlines   Services     Area Served  [employer]  Crisis Hotline, available 24 hours a day, 7 days a week: (409) 497-1329 [facility], [place]   [facility]  Crisis  Hotline, available 24 hours a day, 7 days a week: (941)688-5976 [facility], [place]  [facility]  Suicide Prevention Hotline, available 24 hours a day, 7 days a week: 515-322-4471 [facility], [place]  [employer], available 24 hours a day, 7 days a week: (623)865-6702 [facility], [place]   [facility] Access to [employer], available 24 hours a day, 7 days a week: 404-475-1723 All   [facility]  Crisis Hotline, available 24 hours a day, 7 days a week: (838)404-2724 All   Other Local Resources (Updated 02/2015)  Outpatient Counseling/ Substance Abuse Programs  Services     Address and Phone Number  ADS (Alcohol and Drug Services)   Options include Individual counseling, group counseling, intensive outpatient program (several hours a day, several days a week)  Offers depression assessments  Provides methadone maintenance program [phone] [address] [place], [place] [address]   [facility]   Offers partial hospitalization/day treatment and DUI/DWI programs  [employer], private insurance [phone] [address] [place], [place] [address]  [facility]    Services include intensive outpatient program (several hours a day, several days a week), outpatient treatment, DUI/DWI services, family education  Also has some services specifically for [employer] transitional housing  [phone] [address] [place], [place] [address]     [facility]  [employer], private pay, and private insurance [phone] [address] [place], [place] [address]  [employer] of Care  Services include individual counseling, substance abuse intensive outpatient program (several hours a day, several days a week), day treatment  Delene Loll, Medicaid, private insurance [phone] [address], [address] [place], [place] [address]  [facility]   Offers substance abuse intensive outpatient program (several hours a day, several days a week), partial hospitalization program [phone] [address] [place], [place] [address]  [phone] [address] [place], [place] [address]  [phone] [address] [place], [place] [address]  [phone], [address] [place], [place] [address]  [facility]  Individual counseling only  Accepts private insurance only [phone] [address] [place], [place] [address]  [facility]  Methadone maintenance program [phone] [address] [place], [place] [address]  [facility]  Walk-In Clinic providing substance abuse and mental health counseling  Accepts Medicaid, Medicare, private insurance  Offers sliding scale for uninsured [phone] [address] [place], [place]   [facility] [place], [employer].  Offers individual counseling, and intensive in-home services [phone] [address] [place], [place] [address]  [facility] individual counseling, family counseling, group therapy, domestic violence counseling, consumer credit counseling  Accepts Medicare, Medicaid, private insurance  Offers sliding scale for uninsured [phone] [address] [place], [place] [address]  [phone] [facility], [address] [place], [place] [address]  [facility]  Offers individual, family and group counseling  3 locations - [place], [place], and [place]  [phone]  [address] [place], [place] [address]  [address] [place], [place] [address]  [address] [place], [place] [address]  [facility]    Offers psychiatric assessment, 8-week Intensive Outpatient Program (several hours a day, several times a week, daytime or evenings), early recovery group, family Program,  medication management  Private pay or private insurance only [phone], or  [phone] [address] [place], [place] [address]  [place] [employer] individual, couples and family counseling  Accepts Medicaid, private insurance, and sliding scale for uninsured [phone] [address] [place], [place] [address]  Len Blalock, MD  Individual counseling  Private insurance [phone] [address] [place], [place] [address]  [facility]   Offers assessment, substance abuse treatment, and behavioral health treatment [phone] [address] [place], [place] [address]  [facility]  Individual counseling  Accepts private insurance [phone] [address] [place], [place] [address]  [facility]  Individual counseling  Delene Loll, private insurance [phone] [address] [place], [place] [address]  [facility]    Offers intensive outpatient program (several hours a day, several times a week)  Private pay, private insurance [phone] [facility] [place], [place]  [facility]  Individual counseling  Medicare, private insurance [phone] [address] [place], [place] [address]  [facility]    Offers intensive outpatient program (several hours a day, several times a week) and partial hospitalization program [phone] [address] [place], [place] [address]  Emerson Monte, MD  Individual counseling [phone] [address] [place], [place] [address]  [facility]  Offers Christian counseling to individuals, couples, and families  Accepts Medicare and private insurance; offers sliding scale for uninsured [phone] [address] [place], [place] [address]  [facility]  [place] counseling [phone] [address] [place], [place] [address]  [facility] crisis counseling, individual counseling, group therapy, in-home therapy, domestic violence services, day treatment, DWI services, Administrator, arts (CST), Assertive Community Treatment Team (ACTT), substance abuse Intensive Outpatient Program (several hours a day, several times a week)  2 locations - [place] and [place] [phone] [address] [place], [place] [address]  [phone] [address] [place], [place] [address]  [facility]     Individual counseling and group therapy  Accepts private insurance, [place], [place] [phone] [address] [place], [place]  [facility]  Offers individual and family counseling  Offers LGBTQ services  Accepts private insurance and private pay [phone] [address] [place], [place] [address]  [facility]    Offers individual counseling, group therapy, and outpatient detox  Accepts private insurance [phone] [address] [place], [place]  [facility]  Individual counseling  Accepts Medicare, private insurance [phone] [address] [place], [place] [address]  [employer]  Individual counseling  Accepts Medicare, private insurance [phone] [address] [place], [place] [address]  [facility] Buttner [facility]   Offers substance abuse Intensive Outpatient Program (several hours a day, several times a week) [phone], or [phone] [place], [place]    [employer] Guide Inpatient Behavioral Health/Residential  Substance Abuse Treatment Adults The United Ways 211 is a great source of information about community services available.  Access by dialing 2-1-1 from anywhere in [place], or by website -  PooledIncome.pl.   (Updated 02/2015)  Crisis Assistance 24 hours  a day   Systems analystervices Offered     Area [employer]Served  [facility]  24-hour crisis assistance: [phone] [place], [place]   [facility]  24-hour crisis assistance:[phone] [place], [place]  [facility]   24-hour crisis assistance: [phone] [place], [place]   [facility] Access to Care Line  24-hour crisis assistance; [phone] All   [facility]  24-hour crisis response line: [phone]) [phone] All   Other Local Resources (Updated 02/2015)  Inpatient Behavioral Health/Residential Substance Abuse Treatment Programs   Services      Address and Phone Number  [facility] ([facility])   14-day residential rehabilitation  [phone] [address] [place], [place]  [facility] ([facility])    Detox - private pay only  14-day residential rehabilitation -  Medicaid, insurance, private pay only [phone], or [phone] [address], [place], [place] [address]   [facility]  Private Insurance only  Multiple facilities [phone] admissions   [facility] ([facility])   90-day program  Must be homeless to participate  [phone], or [phone] [place], [facility] [place]  [facility]     Private Insurance only [phone], or  [phone] [address] [place], [place] [phone]  [facility]     Must make an appointment  Transportation is offered from [employer] on [address]  Accepts private pay, Sheryn BisonMedicare, [place] Medicaid [phone]  [address] [place], [place] [address]   [facility]  Females only  Associated with [facility] [phone] [place], [place] [address]  [facility]   Private insurance only [phone], or [phone] [address] [place], [id number]  [facility]    Detox  Residential rehabilitation  Private insurance  only  Multiple locations [phone] admissions  [facility]    Private pay  Private insurance [phone] [address] [place], [place] [id number]  [facility]    Males only  Fee required at time of admission [phone] [address] [place], [place] [address]  [facility]    Private pay only  [phone] [place], [place]  RTS (Residential Treatment Services)    Detox - private pay, Medicaid  Residential rehabilitation for males  - Medicare, Medicaid, insurance, private pay [phone] [address] [place], [place]   DJMEQTROSA    Walk-in interviews Monday - Saturday from 8 am - 4 pm  Individuals with legal charges are not eligible [phone] [address] [place], [place] [address]  [facility]   Must be willing to work  Must attend Alcoholics Anonymous meetings [phone] [address] [place], [place]   [facility]    Faith-based program  Private pay only [phone] [address] [place], [place]

## 2015-05-04 ENCOUNTER — Emergency Department (HOSPITAL_COMMUNITY)
Admission: EM | Admit: 2015-05-04 | Discharge: 2015-05-06 | Disposition: A | Discharge status: Other Acute Inpatient Hospital | Payer: Self-pay | Attending: Emergency Medicine | Admitting: Emergency Medicine

## 2015-05-04 ENCOUNTER — Encounter (HOSPITAL_COMMUNITY): Payer: Self-pay | Admitting: *Deleted

## 2015-05-04 DIAGNOSIS — R443 Hallucinations, unspecified: Secondary | ICD-10-CM

## 2015-05-04 DIAGNOSIS — R45850 Homicidal ideations: Secondary | ICD-10-CM

## 2015-05-04 DIAGNOSIS — Z79899 Other long term (current) drug therapy: Secondary | ICD-10-CM | POA: Insufficient documentation

## 2015-05-04 DIAGNOSIS — R45851 Suicidal ideations: Secondary | ICD-10-CM

## 2015-05-04 DIAGNOSIS — F17210 Nicotine dependence, cigarettes, uncomplicated: Secondary | ICD-10-CM | POA: Insufficient documentation

## 2015-05-04 LAB — COMPREHENSIVE METABOLIC PANEL
ALK PHOS: 50 U/L (ref 38–126)
ALT: 22 U/L (ref 17–63)
ANION GAP: 8 (ref 5–15)
AST: 16 U/L (ref 15–41)
Albumin: 4.6 g/dL (ref 3.5–5.0)
BILIRUBIN TOTAL: 0.7 mg/dL (ref 0.3–1.2)
BUN: 8 mg/dL (ref 6–20)
CALCIUM: 9 mg/dL (ref 8.9–10.3)
CO2: 27 mmol/L (ref 22–32)
Chloride: 104 mmol/L (ref 101–111)
Creatinine, Ser: 0.76 mg/dL (ref 0.61–1.24)
Glucose, Bld: 100 mg/dL — ABNORMAL HIGH (ref 65–99)
Potassium: 3.7 mmol/L (ref 3.5–5.1)
Sodium: 139 mmol/L (ref 135–145)
TOTAL PROTEIN: 7.7 g/dL (ref 6.5–8.1)

## 2015-05-04 LAB — CBC WITH DIFFERENTIAL/PLATELET
BASOS ABS: 0.1 10*3/uL (ref 0.0–0.1)
BASOS PCT: 1 %
Eosinophils Absolute: 0.3 10*3/uL (ref 0.0–0.7)
Eosinophils Relative: 3 %
HEMATOCRIT: 42.3 % (ref 39.0–52.0)
Hemoglobin: 14.5 g/dL (ref 13.0–17.0)
LYMPHS ABS: 2.8 10*3/uL (ref 0.7–4.0)
LYMPHS PCT: 25 %
MCH: 30.7 pg (ref 26.0–34.0)
MCHC: 34.3 g/dL (ref 30.0–36.0)
MCV: 89.4 fL (ref 78.0–100.0)
MONOS PCT: 5 %
Monocytes Absolute: 0.6 10*3/uL (ref 0.1–1.0)
NEUTROS PCT: 66 %
Neutro Abs: 7.5 10*3/uL (ref 1.7–7.7)
Platelets: 370 10*3/uL (ref 150–400)
RBC: 4.73 MIL/uL (ref 4.22–5.81)
RDW: 12.9 % (ref 11.5–15.5)
WBC: 11.3 10*3/uL — AB (ref 4.0–10.5)

## 2015-05-04 LAB — RAPID URINE DRUG SCREEN, HOSP PERFORMED
Amphetamines: NOT DETECTED
BARBITURATES: NOT DETECTED
BENZODIAZEPINES: POSITIVE — AB
COCAINE: NOT DETECTED
Opiates: NOT DETECTED
Tetrahydrocannabinol: NOT DETECTED

## 2015-05-04 LAB — ETHANOL

## 2015-05-04 MED ORDER — OLANZAPINE 5 MG PO TABS
15.0000 mg | ORAL_TABLET | Freq: Every day | ORAL | Status: DC
  Administered 2015-05-04 – 2015-05-05 (×2): 15 mg via ORAL
  Filled 2015-05-04 (×2): qty 3

## 2015-05-04 MED ORDER — CITALOPRAM HYDROBROMIDE 20 MG PO TABS
20.0000 mg | ORAL_TABLET | Freq: Every day | ORAL | Status: DC
  Administered 2015-05-04 – 2015-05-06 (×3): 20 mg via ORAL
  Filled 2015-05-04 (×6): qty 1

## 2015-05-04 MED ORDER — HYDROXYZINE HCL 25 MG PO TABS
50.0000 mg | ORAL_TABLET | Freq: Four times a day (QID) | ORAL | Status: DC | PRN
  Administered 2015-05-04: 50 mg via ORAL
  Filled 2015-05-04: qty 2

## 2015-05-04 MED ORDER — TRAZODONE HCL 50 MG PO TABS
50.0000 mg | ORAL_TABLET | Freq: Every evening | ORAL | Status: DC | PRN
Start: 2015-05-04 — End: 2015-05-06
  Administered 2015-05-04 – 2015-05-05 (×2): 50 mg via ORAL
  Filled 2015-05-04 (×2): qty 1

## 2015-05-04 NOTE — ED Notes (Signed)
Shower, hygiene items and clean linens offered to pt. Pt refused.

## 2015-05-04 NOTE — ED Provider Notes (Signed)
TIME SEEN: 2:00 AM  CHIEF COMPLAINT: Involuntary commitment, suicidal thoughts, hallucinations  HPI: Pt is a 25 y.o. male with history of polysubstance abuse he reports he has been admitted to a psychiatric hospital before who presents to the emergency department with suicidal thoughts today, auditory hallucinations, violent behavior. He has been involuntarily committed by his mother who states that he is hearing voices and seeing someone shining a red laser into his eyes. States that he got upset and broke furniture in the living room of his mother's house. He is prescribed psychiatric medications but has not been taking them for the past 2 weeks. Reports a history of opiate abuse. Denies any use today. No alcohol use. When asked what the voices are saying to him he states "stupid stuff". He denies that they are telling him to hurt himself or anyone else. He does state that he is having suicidal thoughts without a plan. Denies history of prior suicide attempt. States he is also having homicidal thoughts but is unable to tell me who they are directed towards. Denies any pain. No fever, cough, vomiting or diarrhea.  ROS: See HPI Constitutional: no fever  Eyes: no drainage  ENT: no runny nose   Cardiovascular:  no chest pain  Resp: no SOB  GI: no vomiting GU: no dysuria Integumentary: no rash  Allergy: no hives  Musculoskeletal: no leg swelling  Neurological: no slurred speech ROS otherwise negative  PAST MEDICAL HISTORY/PAST SURGICAL HISTORY:  Past Medical History  Diagnosis Date  . Polysubstance abuse     opiates, benzos, marijuana    MEDICATIONS:  Prior to Admission medications   Medication Sig Start Date End Date Taking? Authorizing Provider  citalopram (CELEXA) 20 MG tablet Take 1 tablet (20 mg total) by mouth daily. 04/15/15   Adonis Brook, NP  hydrOXYzine (ATARAX/VISTARIL) 50 MG tablet Take 1 tablet (50 mg total) by mouth every 6 (six) hours as needed for anxiety. 04/15/15    Adonis Brook, NP  nicotine (NICODERM CQ - DOSED IN MG/24 HOURS) 21 mg/24hr patch Place 1 patch (21 mg total) onto the skin daily. 04/15/15   Adonis Brook, NP  OLANZapine (ZYPREXA) 15 MG tablet Take 1 tablet (15 mg total) by mouth at bedtime. 04/15/15   Adonis Brook, NP  traZODone (DESYREL) 50 MG tablet Take 1 tablet (50 mg total) by mouth at bedtime as needed for sleep. 04/15/15   Adonis Brook, NP    ALLERGIES:  Allergies  Allergen Reactions  . Adhesive [Tape]     sensitivity    SOCIAL HISTORY:  Social History  Substance Use Topics  . Smoking status: Current Every Day Smoker -- 2.00 packs/day    Types: Cigarettes  . Smokeless tobacco: Not on file  . Alcohol Use: 3.0 oz/week    5 Cans of beer per week    FAMILY HISTORY: Family History  Problem Relation Age of Onset  . Mental illness Neg Hx     EXAM: BP 120/78 mmHg  Pulse 88  Temp(Src) 98.1 F (36.7 C) (Oral)  Resp 18  Ht  (1.676 m)  Wt 155 lb (70.308 kg)  BMI 25.03 kg/m2  SpO2 98% CONSTITUTIONAL: Alert and oriented and responds appropriately to questions. Appears disheveled, in no distress HEAD: Normocephalic EYES: Conjunctivae clear, PERRL ENT: normal nose; no rhinorrhea; moist mucous membranes NECK: Supple, no meningismus, no LAD  CARD: RRR; S1 and S2 appreciated; no murmurs, no clicks, no rubs, no gallops RESP: Normal chest excursion without splinting or tachypnea; breath  sounds clear and equal bilaterally; no wheezes, no rhonchi, no rales, no hypoxia or respiratory distress, speaking full sentences ABD/GI: Normal bowel sounds; non-distended; soft, non-tender, no rebound, no guarding, no peritoneal signs BACK:  The back appears normal and is non-tender to palpation, there is no CVA tenderness EXT: Normal ROM in all joints; non-tender to palpation; no edema; normal capillary refill; no cyanosis, no calf tenderness or swelling    SKIN: Normal color for age and race; warm; no rash NEURO: Moves all  extremities equally, sensation to light touch intact diffusely, cranial nerves II through XII intact PSYCH: Flat affect, patient endorses SI without plan. Also endorses homicidal thoughts as well. Endorses visual and auditory hallucinations.  MEDICAL DECISION MAKING: Patient here with SI, HI and hallucinations. No current medical complaints. Hemodynamically stable. Exam unremarkable. Labs unremarkable other than mild leukocytosis of 11.3. No fever. No infectious symptoms. He is currently medically cleared. I completed the rest of his involuntary commitment paper work. We'll consult TTS. I will reorder his previous home medications.  ED PROGRESS: 3:30 AM UDS positive for benzos.  Pt has reported history of abuse of marijuana and benzodiazepines as well. Discussed with Margaretmary LombardFatima with TTS who has evaluated the patient. They feel the patient meets inpatient criteria. There are no beds currently [facility]. They are working on placement.  I reviewed all nursing notes, vitals, pertinent old records, labs, imaging (as available).    Layla MawKristen N Roseana Rhine, DO 05/04/15 [phone]

## 2015-05-04 NOTE — ED Notes (Signed)
Lunch tray given. 

## 2015-05-04 NOTE — BH Assessment (Addendum)
Tele Assessment Note   Michael Costa is an 25 y.o. male. Pt reports TH, AH w/o command, and VH. Pt reports seeing "lights and stuff" x 3 months. Pt reports seeing shadow/objects in the corner of his eye and red lights "mostly every time my eyes are closed". Pt reports pta he became frustrated due to the red lights and broke a chair, prompting his mother (Michael Costa: [phone]) to contact authorities.  Per pt chart:  [facility] here to serve Michael Costa with IVC papers that were taken out by his mother. Papers basically states that the pt has been hearing voices, someone is shining red lasers in his eyes and they only shine them on him when he closes his eyes. He states that the lights are burning him when he lays on the couch. Pt has been committed 2 times VOLUNTARILY here recently but tonight the mother is in fear of her and her family's safety as well as the pt's. Pt broke furniture in the living room and is prescribed medicines but he has not been taking them as he should. --------  Pt reports SI with plan to shoot himself. Pt denies intent. Pt reports guns in the home however, states they are locked away and he does not have access to lock. When asked how long he has been experiencing SI, pt responded "it's been going on for a while, off. It's like I can't control it".  Pt reports increased isolation and feelings of hopelessness. Pt states that he has lost pleasure and interest "in life itself really". Pt denies any h/o suicide attempt or self-injurious behaviors. Pt has h/o two voluntarily [facility] inpatient admissions (1.30.17 & 3.5.17) for AH, SA and HI towards mom. Pt reports increase in staying in bed. Pt reports that VH interfere with sleep at times however, he still typically gets about 9hrs of sleep nightly.   Pt denies HI. Pt reports thoughts of harm. Pt denies intent and identified victim. Pt states "just hurting someone in general". Pt reports no h/o violence. Pt reports upcoming court date (4.27.17)  for driving with suspended license. Pt reports past experiences of persecutory voices/beliefs but denies the presence of any at this time.    Pt reports h/o oxycodone, hydrocodone & Xanax use. Pt denies any additional substance/alcohol use. Per chart, pt has PMHDX of MDD w/psychosis, ADHD, and GAD. Pt reports non-compliance with medications. Pt is currently not followed by any outpatient psychiatric provider   Diagnosis: MDD, GAD, Opiate Use, ADHD (per chart & pt report)  Past Medical History:  Past Medical History  Diagnosis Date  . Polysubstance abuse     opiates, benzos, marijuana    History reviewed. No pertinent past surgical history.  Family History:  Family History  Problem Relation Age of Onset  . Mental illness Neg Hx     Social History:  reports that he has been smoking Cigarettes.  He has been smoking about 2.00 packs per day. He does not have any smokeless tobacco history on file. He reports that he drinks about 3.0 oz of alcohol per week. He reports that he uses illicit drugs (Marijuana, Hydrocodone, Oxycodone, and Benzodiazepines).  Additional Social History:  Alcohol / Drug Use Pain Medications: Oxycodone & hydrocodone Prescriptions: See [facility] d/c med list-Non-compliance. Pt states "it ran out", pt did not get strattera filled due to price Over the Counter: None Reported History of alcohol / drug use?: Yes Longest period of sobriety (when/how long): 10 days Negative Consequences of Use: [employer],  Personal relationships Withdrawal Symptoms: Irritability Substance #2 Name of Substance 2: Oxycodone & Hydrocodone 2 - Age of First Use: 25 years of age 70 - Amount (size/oz): "usually a couple or Animal nutritionist" 2 - Frequency: "most of the time every day" 2 - Duration: Ongoing 2 - Last Use / Amount: 3.31.17, 3  Oxycodone pills Substance #3 Name of Substance 3: Xanax 3 - Age of First Use: Teens 3 - Amount (size/oz):  3 - Frequency: "it aint very usual" 3 -  Duration: Ongoing 3 - Last Use / Amount: 3 days ago/ 2 1 mg pills  CIWA: CIWA-Ar BP: 120/78 mmHg Pulse Rate: 88 COWS:    PATIENT STRENGTHS: (choose at least two) Average or above average intelligence Communication skills Supportive family/friends  Allergies:  Allergies  Allergen Reactions  . Adhesive [Tape]     sensitivity    Home Medications:  (Not in a hospital admission)  OB/GYN Status:  No LMP for male patient.  General Assessment Data Location of Assessment: [facility] TTS Assessment: In system Is this a Tele or Face-to-Face Assessment?: Tele Assessment Is this an Initial Assessment or a Re-assessment for this encounter?: Initial Assessment Marital status: Single Is patient pregnant?: No Pregnancy Status: No Living Arrangements: Parent Can pt return to current living arrangement?: Yes Admission Status: Involuntary Is patient capable of signing voluntary admission?: No Referral Source: Self/Family/Friend Insurance type: Self-Pay     Crisis Care Plan Living Arrangements: Parent Name of Psychiatrist: None  Name of Therapist: None  Education Status Is patient currently in school?: No Highest grade of school patient has completed: 10th  Risk to self with the past 6 months Suicidal Ideation: Yes-Currently Present Has patient been a risk to self within the past 6 months prior to admission? : No Suicidal Intent: No Has patient had any suicidal intent within the past 6 months prior to admission? : No Is patient at risk for suicide?: Yes Suicidal Plan?: Yes-Currently Present Has patient had any suicidal plan within the past 6 months prior to admission? : No Specify Current Suicidal Plan: Shoot self with gun Access to Means: No (reports locked guns in home-denies access to lock) Specify Access to Suicidal Means: Pt reports guns present in the home however, states they are locked away and he does not have access to unclock What has been your use of drugs/alcohol  within the last 12 months?: Pt reports use of xanax and opiates Previous Attempts/Gestures: No Other Self Harm Risks: Fustration w/ VH, loss of intererest/pleasure in life Intentional Self Injurious Behavior: None Family Suicide History: No Recent stressful life event(s): Other (Comment) (VH) Persecutory voices/beliefs?: No Depression: Yes Depression Symptoms: Loss of interest in usual pleasures, Feeling worthless/self pity, Feeling angry/irritable, Tearfulness, Isolating, Guilt Substance abuse history and/or treatment for substance abuse?: Yes Suicide prevention information given to non-admitted patients: Not applicable  Risk to Others within the past 6 months Homicidal Ideation: No Does patient have any lifetime risk of violence toward others beyond the six months prior to admission? : No Thoughts of Harm to Others: Yes-Currently Present Comment - Thoughts of Harm to Others: "Just hurting someone in general" Current Homicidal Intent: No Current Homicidal Plan: No Access to Homicidal Means: No Describe Access to Homicidal Means: None Reported Identified Victim: Pt denies identified victim History of harm to others?: No Assessment of Violence: None Noted Violent Behavior Description: Pt denies Does patient have access to weapons?: No (guns locked up in home/pt reports no access to key) Criminal Charges Pending?: No Describe  Pending Criminal Charges: n Does patient have a court date: Yes Court Date: 05/30/15 (Driving w/suspended licensed) Is patient on probation?: No  Psychosis Hallucinations: Auditory, Tactile, Visual Delusions: None noted  Mental Status Report Appearance/Hygiene: In scrubs Eye Contact: Fair Motor Activity: Unremarkable Speech: Logical/coherent Level of Consciousness: Alert Mood: Anxious, Depressed Affect: Anxious, Depressed Anxiety Level: Minimal Thought Processes: Relevant, Coherent Judgement: Unimpaired Orientation: Person, Place, Time, Situation,  Appropriate for developmental age Obsessive Compulsive Thoughts/Behaviors: None  Cognitive Functioning Concentration: Fair Memory: Recent Intact, Remote Intact IQ: Average Insight: Fair Impulse Control: Poor Appetite: Good Weight Loss: 0 Weight Gain: 2.5 (within last 2-3wks) Sleep: No Change Total Hours of Sleep: 9 Vegetative Symptoms: Staying in bed  ADLScreening [facility] Assessment Services) Patient's cognitive ability adequate to safely complete daily activities?: Yes Patient able to express need for assistance with ADLs?: No Independently performs ADLs?: Yes (appropriate for developmental age)  Prior Inpatient Therapy Prior Inpatient Therapy: Yes Prior Therapy Dates: 03/06-03/13; 03/04/15-02//02/17 [facility] Reason for Treatment: MDD, substance abuse, HI  Prior Outpatient Therapy Prior Outpatient Therapy: Yes Prior Therapy Dates: 03/2015 Prior Therapy Facilty/Provider(s): [facility] Reason for Treatment: MDD, substance abuse Does patient have an ACCT team?: No Does patient have Intensive In-House Services?  : No Does patient have [facility] services? : No Does patient have P4CC services?: No  ADL Screening (condition at time of admission) Patient's cognitive ability adequate to safely complete daily activities?: Yes Is the patient deaf or have difficulty hearing?: No Does the patient have difficulty seeing, even when wearing glasses/contacts?: No Does the patient have difficulty concentrating, remembering, or making decisions?: Yes Patient able to express need for assistance with ADLs?: No Does the patient have difficulty dressing or bathing?: No Independently performs ADLs?: Yes (appropriate for developmental age) Does the patient have difficulty walking or climbing stairs?: No Weakness of Legs: None Weakness of Arms/Hands: None  Home Assistive Devices/Equipment Home Assistive Devices/Equipment: None  Therapy Consults (therapy consults  require a physician order) PT Evaluation Needed: No OT Evalulation Needed: No SLP Evaluation Needed: No Abuse/Neglect Assessment (Assessment to be complete while patient is alone) Physical Abuse: Denies Verbal Abuse: Denies Sexual Abuse: Denies Exploitation of patient/patient's resources: Denies Self-Neglect: Denies Values / Beliefs Cultural Requests During Hospitalization: None Spiritual Requests During Hospitalization: None Consults Spiritual Care Consult Needed: No Social Work Consult Needed: No Merchant navy officerAdvance Directives (For Healthcare) Does patient have an advance directive?: No Would patient like information on creating an advanced directive?: No - patient declined information    Additional Information 1:1 In Past 12 Months?: No CIRT Risk: No Elopement Risk: No Does patient have medical clearance?: Yes      Disposition: Per Alberteen SamFran Hobson, NP pt meets criteria for inpatient placement. Writer confirmed lack of [facility] bed availability with Jo[facility]. TTS to seek placement. PT RN Hilbert Corrigan(Lorrie) and EDP Dr.Ward informed of pt disposition.  Disposition Initial Assessment Completed for this Encounter: Yes Disposition of Patient: Other dispositions Other disposition(s): Other (Comment) (Pending Psychiatric Extender Recommendation)  Myley Bahner J [place] 05/04/2015 3:04 AM

## 2015-05-04 NOTE — ED Notes (Signed)
Pt reports 2 previous psych admissions - does not recall his diagnosis, states he "might" be an addict. Reports auditory and visual hallucinations for the last 4 months- does not hear words, only noises and does not know from front or back. He also reports HI but refuses to name victim, and then SI . He is counseled that he is in a safe place pronouns we, together we, and bridge started for caregiving to insure pt of his inclusion in care

## 2015-05-04 NOTE — ED Notes (Signed)
[facility], patient is wanting to be evaluated for mental issues, patient is suicidal.

## 2015-05-04 NOTE — Progress Notes (Signed)
Disposition CSW completed patient referrals to the following inpatient psych facilities:  [facility]  CSW will continue to follow patient for placement needs.  Seward SpeckLeo Fredis Malkiewicz [facility] CenterCSW,LCAS Behavioral Health Disposition CSW [phone]

## 2015-05-04 NOTE — ED Notes (Addendum)
[facility] here to serve Michael Costa with IVC papers that were taken out by his mother. Papers basically states that the pt has been hearing voices, someone is shining red lazors in his eyes and they only shine them on him when he closes his eyes. He states that the lights are burning him when he lays on the couch. Pt has been committed 2 times VOLUNTARILY here recently but tonight the mother is in fear of her and her families safety as well as the pt's. Pt broke furniture in the living room and is prescribed medicines but he has not been taking them as he should.  April Fulp-mother-[phone] [place], [place] [id number]

## 2015-05-04 NOTE — ED Notes (Signed)
Pt states he has been seeing a red light and he can't make it go away. Pt also reports hearing voices. EMS reports pt tore the house up. Pt states the light was bothering him and that's what made him tear the house.  Pt reports SI but doesn't know how he would do it.

## 2015-05-04 NOTE — ED Notes (Signed)
[facility]

## 2015-05-04 NOTE — ED Notes (Signed)
Pt meets inpatient criteria but [facility] doesn't have any beds at this time. They will continue to look for placement.

## 2015-05-04 NOTE — ED Notes (Signed)
Pt reports that medications have made h im less anxious. He is currently resting on his left side, eyes closed with even, unlabored respirations

## 2015-05-05 ENCOUNTER — Encounter (HOSPITAL_COMMUNITY): Payer: Self-pay | Admitting: Emergency Medicine

## 2015-05-05 NOTE — ED Notes (Signed)
Received report on pt, pt resting with eyes closed, resp even and non labored, sitter remains at bedside,

## 2015-05-06 ENCOUNTER — Encounter (HOSPITAL_COMMUNITY): Payer: Self-pay

## 2015-05-06 ENCOUNTER — Inpatient Hospital Stay (HOSPITAL_COMMUNITY)
Admission: AD | Admit: 2015-05-06 | Discharge: 2015-05-15 | DRG: 885 | Disposition: A | Discharge status: Home / Self Care | Payer: No Typology Code available for payment source | Attending: Psychiatry | Admitting: Psychiatry

## 2015-05-06 DIAGNOSIS — F333 Major depressive disorder, recurrent, severe with psychotic symptoms: Secondary | ICD-10-CM | POA: Diagnosis not present

## 2015-05-06 DIAGNOSIS — Z818 Family history of other mental and behavioral disorders: Secondary | ICD-10-CM | POA: Diagnosis not present

## 2015-05-06 DIAGNOSIS — F1010 Alcohol abuse, uncomplicated: Secondary | ICD-10-CM | POA: Diagnosis not present

## 2015-05-06 DIAGNOSIS — F1310 Sedative, hypnotic or anxiolytic abuse, uncomplicated: Secondary | ICD-10-CM | POA: Diagnosis present

## 2015-05-06 DIAGNOSIS — F411 Generalized anxiety disorder: Secondary | ICD-10-CM | POA: Diagnosis present

## 2015-05-06 DIAGNOSIS — F17210 Nicotine dependence, cigarettes, uncomplicated: Secondary | ICD-10-CM | POA: Diagnosis present

## 2015-05-06 DIAGNOSIS — G4700 Insomnia, unspecified: Secondary | ICD-10-CM | POA: Diagnosis present

## 2015-05-06 DIAGNOSIS — F909 Attention-deficit hyperactivity disorder, unspecified type: Secondary | ICD-10-CM | POA: Diagnosis present

## 2015-05-06 DIAGNOSIS — Z9114 Patient's other noncompliance with medication regimen: Secondary | ICD-10-CM | POA: Diagnosis not present

## 2015-05-06 DIAGNOSIS — F1120 Opioid dependence, uncomplicated: Secondary | ICD-10-CM | POA: Diagnosis present

## 2015-05-06 DIAGNOSIS — F1320 Sedative, hypnotic or anxiolytic dependence, uncomplicated: Secondary | ICD-10-CM | POA: Diagnosis present

## 2015-05-06 DIAGNOSIS — F17200 Nicotine dependence, unspecified, uncomplicated: Secondary | ICD-10-CM | POA: Diagnosis present

## 2015-05-06 DIAGNOSIS — F1390 Sedative, hypnotic, or anxiolytic use, unspecified, uncomplicated: Secondary | ICD-10-CM | POA: Diagnosis not present

## 2015-05-06 MED ORDER — MAGNESIUM HYDROXIDE 400 MG/5ML PO SUSP: 30.0000 mL | Freq: Every day | ORAL | Status: DC | PRN

## 2015-05-06 MED ORDER — ENSURE ENLIVE PO LIQD
237.0000 mL | Freq: Two times a day (BID) | ORAL | Status: DC
  Administered 2015-05-06 – 2015-05-14 (×17): 237 mL via ORAL

## 2015-05-06 MED ORDER — ACETAMINOPHEN 325 MG PO TABS
650.0000 mg | ORAL_TABLET | Freq: Four times a day (QID) | ORAL | Status: DC | PRN
  Administered 2015-05-08 – 2015-05-13 (×4): 650 mg via ORAL
  Filled 2015-05-06 (×4): qty 2

## 2015-05-06 MED ORDER — NICOTINE 21 MG/24HR TD PT24
21.0000 mg | MEDICATED_PATCH | Freq: Every day | TRANSDERMAL | Status: DC
  Administered 2015-05-06 – 2015-05-14 (×9): 21 mg via TRANSDERMAL
  Filled 2015-05-06 (×14): qty 1

## 2015-05-06 MED ORDER — HYDROXYZINE HCL 50 MG PO TABS
50.0000 mg | ORAL_TABLET | Freq: Four times a day (QID) | ORAL | Status: DC | PRN
  Administered 2015-05-06: 50 mg via ORAL
  Filled 2015-05-06: qty 1

## 2015-05-06 MED ORDER — CITALOPRAM HYDROBROMIDE 20 MG PO TABS
20.0000 mg | ORAL_TABLET | Freq: Every day | ORAL | Status: DC
  Administered 2015-05-07: 20 mg via ORAL
  Filled 2015-05-06 (×2): qty 1

## 2015-05-06 MED ORDER — OLANZAPINE 7.5 MG PO TABS
15.0000 mg | ORAL_TABLET | Freq: Every day | ORAL | Status: DC
  Administered 2015-05-06: 15 mg via ORAL
  Filled 2015-05-06 (×2): qty 2

## 2015-05-06 MED ORDER — TRAZODONE HCL 50 MG PO TABS
50.0000 mg | ORAL_TABLET | Freq: Every evening | ORAL | Status: DC | PRN
  Administered 2015-05-06 – 2015-05-14 (×9): 50 mg via ORAL
  Filled 2015-05-06: qty 7
  Filled 2015-05-06 (×9): qty 1

## 2015-05-06 MED ORDER — CHLORDIAZEPOXIDE HCL 25 MG PO CAPS
25.0000 mg | ORAL_CAPSULE | Freq: Four times a day (QID) | ORAL | Status: DC | PRN
  Administered 2015-05-06: 25 mg via ORAL
  Filled 2015-05-06: qty 1

## 2015-05-06 MED ORDER — ALUM & MAG HYDROXIDE-SIMETH 200-200-20 MG/5ML PO SUSP
30.0000 mL | ORAL | Status: DC | PRN
  Administered 2015-05-10 – 2015-05-14 (×2): 30 mL via ORAL
  Filled 2015-05-06 (×2): qty 30

## 2015-05-06 NOTE — Progress Notes (Signed)
[facility] attending MD Dr. Elna BreslowEappen. Report can be called at [phone]. Pt can arrive anytime. Pt is under IVC.  Ilean SkillMeghan Angello Chien, MSW, LCSW Clinical Social Work, Disposition  05/06/2015 [phone]

## 2015-05-06 NOTE — ED Notes (Signed)
[facility] called to verify if pt needed to still be on waiting list, RN advised that pt would still need placement,

## 2015-05-06 NOTE — Progress Notes (Signed)
Michael Costa is a 25 year old male being admitted involuntarily to [facility] 505-1 from [facility].  He was IVC'd by his mother due to hearing voices and seeing red laser lights in which he became agitated and had broken furniture in the home.  He carries a diagnosis of MDD, GAD, Opiate use and ADHD.  He admits to using multiple substances but his UDS was only positive for benzos.  He reports that he has some suciidal thoughts but no plan or intent and is able to contract for safety on the unit.  Admission paperwork completed and signed.  Belongings searched and secured in locker # 35 (socks, cell phone, belt, wallet with no cash, shoes).  Skin assessment completed and noted tattoo upper back and old laceration in lower back that is well healed.  Q 15 minute checks initiated for safety.  We will monitor the progress towards his goals.

## 2015-05-06 NOTE — ED Notes (Signed)
Pt resting with eyes closed, sitter remains at bedside,  

## 2015-05-06 NOTE — [facility] Counselor (Signed)
Adult Psychosocial Assessment Update Interdisciplinary Team  [facility]  Admissions Discharges  Date: 04/07/15 Date:  Date:03/04/15 Date:  Date: Date:  Date: Date:  Date: Date:   Changes since the last Psychosocial Assessment (including adherence to outpatient mental health and/or substance abuse treatment, situational issues contributing to decompensation and/or relapse). [facility] depression, psychosis and agg ression in the home [he broke up some furniture].Although he admits that [facility] after his last hospitalization, Michael Costa is adamant that he was taking his medication as prescribed.  The IVC paperwork by mother states he was taking it "improperly."  Michael Costa also states that he was clean and sober for "3 or 4 days" when he returned home.  When asked how he was able to do this, he replied "I just stayed in the house."               Discharge Plan 1. Will you be returning to the same living situation after discharge?  Yes: No:x If no, what is your plan?  Michael Costa is [facility].         2. Would you like a referral for services when you are discharged? Yes:X If yes, for what services? [facility] No:            Summary and Recommendations (to be completed by the evaluator) Michael Costa is a 25 YO Caucasian male who is visiting us for the third time in 8 weeks.  He is diagnosed with Major Depressive Disorder, recurrent, severe with psychosis as well as polysubstance abuse.  He presents with depression, AH and VH and admits to on-going abuse of benzos and opiates.  He is asking for a referral to rehab.  He can benefit from crises stabilization, medication management, therapeutic milieu and referral for services.

## 2015-05-06 NOTE — ED Provider Notes (Signed)
[facility] El[facility]reslowEappen.  BP 118/72 mmHg  Pulse 73  Temp(Src) 97.4 F (36.3 C) (Oral)  Resp 16  Ht 5\' 6"  (1.676 m)  Wt 155 lb (70.308 kg)  BMI 25.03 kg/m2  SpO2 99%   Michael OctaveStephen Siham Bucaro, MD 05/06/15 1105

## 2015-05-06 NOTE — Tx Team (Addendum)
Initial Interdisciplinary Treatment Plan   PATIENT STRESSORS: Financial difficulties Substance abuse   PATIENT STRENGTHS: Physical Health Supportive family/friends   PROBLEM LIST: Problem List/Patient Goals Date to be addressed Date deferred Reason deferred Estimated date of resolution  Depression 05/06/15     Anxiety  05/06/15     Substance abuse 05/06/15     Psychosis 05/06/15     "To get these lights to stop" 05/06/15     "Concentrate and focus better" 05/06/15     Increased risk for SI                   DISCHARGE CRITERIA:  Improved stabilization in mood, thinking, and/or behavior Verbal commitment to aftercare and medication compliance  PRELIMINARY DISCHARGE PLAN: Outpatient therapy Medication management  PATIENT/FAMIILY INVOLVEMENT: This treatment plan has been presented to and reviewed with the patient, Michael Costa.  The patient and family have been given the opportunity to ask questions and make suggestions.  Norm ParcelHeather V Reddick 05/06/2015, 2:29 PM

## 2015-05-06 NOTE — ED Notes (Signed)
[facility] here for transport

## 2015-05-06 NOTE — Progress Notes (Signed)
Adult Psychoeducational Group Note  Date:  05/06/2015 Time:  8:53 PM  Group Topic/Focus:  Wrap-Up Group:   The focus of this group is to help patients review their daily goal of treatment and discuss progress on daily workbooks.  Participation Level:  Active  Participation Quality:  Appropriate  Affect:  Appropriate  Cognitive:  Appropriate  Insight: Appropriate  Engagement in Group:  Engaged  Modes of Intervention:  Discussion  Additional Comments:  The patient erxpresed that he rates his day a 6 which is OK.The patient also said that he attended all groups.  Octavio Mannshigpen, Aydan Levitz Lee 05/06/2015, 8:53 PM

## 2015-05-07 ENCOUNTER — Encounter (HOSPITAL_COMMUNITY): Payer: Self-pay | Admitting: Psychiatry

## 2015-05-07 DIAGNOSIS — F17200 Nicotine dependence, unspecified, uncomplicated: Secondary | ICD-10-CM

## 2015-05-07 DIAGNOSIS — F333 Major depressive disorder, recurrent, severe with psychotic symptoms: Principal | ICD-10-CM

## 2015-05-07 DIAGNOSIS — F1120 Opioid dependence, uncomplicated: Secondary | ICD-10-CM

## 2015-05-07 DIAGNOSIS — F1390 Sedative, hypnotic, or anxiolytic use, unspecified, uncomplicated: Secondary | ICD-10-CM

## 2015-05-07 DIAGNOSIS — F1010 Alcohol abuse, uncomplicated: Secondary | ICD-10-CM

## 2015-05-07 DIAGNOSIS — R45851 Suicidal ideations: Secondary | ICD-10-CM

## 2015-05-07 MED ORDER — CLONIDINE HCL 0.1 MG PO TABS
0.1000 mg | ORAL_TABLET | ORAL | Status: AC
  Administered 2015-05-09 – 2015-05-10 (×4): 0.1 mg via ORAL
  Filled 2015-05-07 (×5): qty 1

## 2015-05-07 MED ORDER — DICYCLOMINE HCL 20 MG PO TABS: 20.0000 mg | ORAL_TABLET | Freq: Four times a day (QID) | ORAL | Status: AC | PRN

## 2015-05-07 MED ORDER — ONDANSETRON 4 MG PO TBDP: 4.0000 mg | ORAL_TABLET | Freq: Four times a day (QID) | ORAL | Status: AC | PRN

## 2015-05-07 MED ORDER — NAPROXEN 500 MG PO TABS: 500.0000 mg | ORAL_TABLET | Freq: Two times a day (BID) | ORAL | Status: AC | PRN

## 2015-05-07 MED ORDER — CLONIDINE HCL 0.1 MG PO TABS
0.1000 mg | ORAL_TABLET | Freq: Every day | ORAL | Status: AC
  Administered 2015-05-11 – 2015-05-12 (×2): 0.1 mg via ORAL
  Filled 2015-05-07 (×2): qty 1

## 2015-05-07 MED ORDER — METHOCARBAMOL 500 MG PO TABS: 500.0000 mg | ORAL_TABLET | Freq: Three times a day (TID) | ORAL | Status: AC | PRN

## 2015-05-07 MED ORDER — CLONIDINE HCL 0.1 MG PO TABS
0.1000 mg | ORAL_TABLET | Freq: Four times a day (QID) | ORAL | Status: AC
  Administered 2015-05-07 – 2015-05-08 (×7): 0.1 mg via ORAL
  Filled 2015-05-07 (×10): qty 1

## 2015-05-07 MED ORDER — HYDROXYZINE HCL 25 MG PO TABS
25.0000 mg | ORAL_TABLET | Freq: Four times a day (QID) | ORAL | Status: AC | PRN
  Administered 2015-05-07 – 2015-05-12 (×6): 25 mg via ORAL
  Filled 2015-05-07 (×6): qty 1

## 2015-05-07 MED ORDER — SERTRALINE HCL 25 MG PO TABS: 25.0000 mg | ORAL_TABLET | Freq: Every day | ORAL | Status: DC

## 2015-05-07 MED ORDER — CHLORDIAZEPOXIDE HCL 25 MG PO CAPS
25.0000 mg | ORAL_CAPSULE | Freq: Four times a day (QID) | ORAL | Status: DC | PRN
  Administered 2015-05-08 – 2015-05-12 (×7): 25 mg via ORAL
  Filled 2015-05-07 (×8): qty 1

## 2015-05-07 MED ORDER — FLUOXETINE HCL 20 MG PO CAPS
20.0000 mg | ORAL_CAPSULE | Freq: Every day | ORAL | Status: DC
  Administered 2015-05-08 – 2015-05-09 (×2): 20 mg via ORAL
  Filled 2015-05-07 (×4): qty 1

## 2015-05-07 MED ORDER — LOPERAMIDE HCL 2 MG PO CAPS: 2.0000 mg | ORAL_CAPSULE | ORAL | Status: AC | PRN

## 2015-05-07 MED ORDER — OLANZAPINE 10 MG PO TABS
20.0000 mg | ORAL_TABLET | Freq: Every day | ORAL | Status: DC
  Administered 2015-05-07 – 2015-05-08 (×2): 20 mg via ORAL
  Filled 2015-05-07 (×5): qty 2

## 2015-05-07 MED ORDER — BENZTROPINE MESYLATE 0.5 MG PO TABS
0.5000 mg | ORAL_TABLET | Freq: Every day | ORAL | Status: DC
  Administered 2015-05-07 – 2015-05-14 (×8): 0.5 mg via ORAL
  Filled 2015-05-07: qty 7
  Filled 2015-05-07 (×12): qty 1

## 2015-05-07 NOTE — [facility] Group Notes (Signed)
[facility] LCSW Group Therapy  05/07/2015 , 3:47 PM   Type of Therapy:  Group Therapy  Participation Level:  Active  Participation Quality:  Attentive  Affect:  Appropriate  Cognitive:  Alert  Insight:  Improving  Engagement in Therapy:  Engaged  Modes of Intervention:  Discussion, Exploration and Socialization  Summary of Progress/Problems: Today's group focused on the term Diagnosis.  Participants were asked to define the term, and then pronounce whether it is a negative, positive or neutral term.  Did not attend.  Michael Costa, Michael Costa 05/07/2015 , 3:47 PM

## 2015-05-07 NOTE — Progress Notes (Signed)
Adult Psychoeducational Group Note  Date:  05/07/2015 Time:  8:52 PM  Group Topic/Focus:  Wrap-Up Group:   The focus of this group is to help patients review their daily goal of treatment and discuss progress on daily workbooks.  Participation Level:  Active  Participation Quality:  Appropriate  Affect:  Appropriate  Cognitive:  Alert  Insight: Appropriate  Engagement in Group:  Engaged  Modes of Intervention:  Discussion  Additional Comments:  Patient goal for today was to focus more. On a scale between 1-10, (1=worse, 10=best) patient rated his day a 7 1/2.  Michael Costa L Michael Costa 05/07/2015, 8:52 PM

## 2015-05-07 NOTE — Progress Notes (Signed)
D: Pt presents anxious in affect and mood. Pt denies any SI/HI/AVH. Pt actively participates within the milieu. Pt's CIWA rated at a (3) for anxiety and tremors. Pt is compliant with his current POC. A: Writer administered scheduled and prn medications to pt, per MD orders. Continued support and availability as needed was extended to this pt. Staff continues to monitor pt with q6815min checks.  R: No adverse drug reactions noted. Pt receptive to treatment. Pt remains safe at this time.

## 2015-05-07 NOTE — Progress Notes (Signed)
Pt had been up and visible in milieu, did attend and participate in evening group activity. Pt spoke of his day and how he ended up in the hospital. Pt spoke about how he has been hearing and seeing things and that this has been stressful for him. Pt also spoke briefly about some substance abuse issues and spoke about on-going anxiety. Pt was able to receive bedtime medications without incident and did receive PRN Librium to help with any anxiety or possible withdrawal symptoms.

## 2015-05-07 NOTE — Progress Notes (Signed)
D- Patient has been in his room for the majority of the shift resting.  Patient had no complaints of withdrawal but stated that his anxiety has been high. Patient denies SI, HI , and AVH.  A-  Assess patient for safety, offer medications as prescribed, engage patient in 1:1 staff talks  R- Patient able to contract for safety.

## 2015-05-07 NOTE — [facility] Suicide Risk Assessment (Signed)
[facility]   Nursing information obtained from:  Patient Demographic factors:  Male, Caucasian Current Mental Status:  NA Loss Factors:  Financial problems / change in socioeconomic status Historical Factors:  Family history of mental illness or substance abuse, Impulsivity Risk Reduction Factors:  Living with another person, especially a relative  Total Time spent with patient: 30 minutes Principal Problem: MDD (major depressive disorder), recurrent, severe, with psychosis (HCC) Diagnosis:   Patient Active Problem List   Diagnosis Date Noted  . MDD (major depressive disorder), recurrent, severe, with psychosis (HCC) [F33.3] 04/07/2015  . 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 Alcohol use disorder, mild, abuse [F10.10] 03/05/2015  . 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 Tobacco use disorder [F17.200] 03/05/2015   Subjective Data: Please see H&P.   Continued Clinical Symptoms:  Alcohol Use Disorder Identification Test Final Score (AUDIT): 5 The "Alcohol Use Disorders Identification Test", Guidelines for Use in Primary Care, Second Edition.  World Science writerHealth Organization [facility](WHO). Score between 0-7:  no or low risk or alcohol related problems. Score between 8-15:  moderate risk of alcohol related problems. Score between 16-19:  high risk of alcohol related problems. Score 20 or above:  warrants further diagnostic evaluation for alcohol dependence and treatment.   CLINICAL FACTORS:   Alcohol/Substance Abuse/Dependencies Previous Psychiatric Diagnoses and Treatments   Musculoskeletal: Strength & Muscle Tone: within normal limits Gait & Station: normal Patient leans: N/A  Psychiatric Specialty Exam: Review of Systems  Psychiatric/Behavioral: Positive for depression, suicidal ideas, hallucinations and substance abuse. The patient is nervous/anxious.   All other systems reviewed and are negative.    Blood pressure 130/63, pulse 76, temperature 97.7 F (36.5 C), temperature source Oral, resp. rate 18, height 5\' 6"  (1.676 m), weight 70.308 kg (155 lb).Body mass index is 25.03 kg/(m^2).                       Please see H&P.                                    COGNITIVE FEATURES THAT CONTRIBUTE TO RISK:  Closed-mindedness, Polarized thinking and Thought constriction (tunnel vision)    SUICIDE RISK:   Moderate:  Frequent suicidal ideation with limited intensity, and duration, some specificity in terms of plans, no associated intent, good self-control, limited dysphoria/symptomatology, some risk factors present, and identifiable protective factors, including available and accessible social support.  PLAN OF CARE: Please see H&P.   I certify that inpatient services furnished can reasonably be expected to improve the patient's condition.   Lezly Rumpf, MD 05/07/2015, 12:41 PM

## 2015-05-07 NOTE — H&P (Addendum)
Psychiatric Admission Assessment Adult  Patient Identification: Michael Costa MRN:  [id number] Date of Evaluation:  05/07/2015 Chief Complaint: Pt states " I felt depressed again."   Principal Diagnosis: MDD (major depressive disorder), recurrent, severe, with psychosis (HCC) Diagnosis:   Patient Active Problem List   Diagnosis Date Noted  . MDD (major depressive disorder), recurrent, severe, with psychosis (HCC) [F33.3] 04/07/2015  . 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 Alcohol use disorder, mild, abuse [F10.10] 03/05/2015  . 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 Tobacco use disorder [F17.200] 03/05/2015       History of Present Illness:: Michael Costa is a 25 y.o. Caucasian male, who is single , unemployed who lives with his mother in [place], [place] , who has a hx of depression as well as substance abuse , who presented to [facility] , IVCed by his mother for hallucinations as well as being aggressive at home.  Pt was recently discharged from [facility] on 04/07/15 - 04/15/15.  Pt per initial notes in EHR : " Michael Costa with IVC papers that were taken out by his mother. Papers basically states that the pt has been hearing voices, someone is shining red lasers in his eyes and they only shine them on him when he closes his eyes. He states that the lights are burning him when he lays on the couch. Pt has been committed 2 times VOLUNTARILY here recently but tonight the mother is in fear of her and her family's safety as well as the pt's. Pt broke furniture in the living room and is prescribed medicines but he has not been taking them as he should. Pt reports SI with plan to shoot himself. Pt denies intent. Pt reports guns in the home however, states they are locked away and he does not have access to lock. When asked how long he has been experiencing SI, pt responded "it's been going on for a while, off. It's like I can't control it".  Pt reports h/o  oxycodone, hydrocodone & Xanax use."   Patient seen and chart reviewed today .Discussed patient with treatment team. Pt today seen as withdrawn, isolative , reports he has worsening depression, anhedonia, sleep issues, loss of appetite as well as AH telling me things and VH of seeing red lights.   Pt reports several psychosocial stressors like parents separating, losing his job , having financial issues and so on.   Pt today also reports that he has been abusing xanax when he can get - unable to states exactly how much, has been going on for years. Pt also reports opioid pain medication abuse , buys it of the streets, whenever possible , ongoing for several years.    Pt reports he has never been to a substance abuse facility before and wants to go to a treatment program for substance abuse .  Pt reports celexa as not working and wants to try something different for his affective sx.   Associated Signs/Symptoms: Depression Symptoms:  depressed mood, anhedonia, insomnia, psychomotor retardation, fatigue, feelings of worthlessness/guilt, difficulty concentrating, hopelessness, anxiety, decreased appetite, (Hypo) Manic Symptoms:  Distractibility, Hallucinations, Impulsivity, Irritable Mood, Anxiety Symptoms:  Excessive Worry, Psychotic Symptoms:  Delusions, Hallucinations: Auditory Visual Paranoia, PTSD Symptoms: Negative Total Time spent with patient: 45 minutes  Past Psychiatric History: Pt reports being admitted at [facility] x3 in the past . Most recently on 04/07/15 - 04/15/15. Pt reports going to [facility] once, is not compliant. Pt denies any hx of suicide attempts.  Is the patient at risk to self? Yes.    Has the patient been a risk to self in the past 6 months? Yes.    Has the patient been a risk to self within the distant past? Yes.    Is the patient a risk to others? Yes.    Has the patient been a risk to others in the past 6 months? Yes.    Has the patient been a risk to  others within the distant past? Yes.     Prior Inpatient Therapy:  see above Prior Outpatient Therapy:  see above  Alcohol Screening: 1. How often do you have a drink containing alcohol?: 2 to 4 times a month 2. How many drinks containing alcohol do you have on a typical day when you are drinking?: 5 or 6 3. How often do you have six or more drinks on one occasion?: Less than monthly Preliminary Score: 3 4. How often during the last year have you found that you were not able to stop drinking once you had started?: Never 5. How often during the last year have you failed to do what was normally expected from you becasue of drinking?: Never 6. How often during the last year have you needed a first drink in the morning to get yourself going after a heavy drinking session?: Never 7. How often during the last year have you had a feeling of guilt of remorse after drinking?: Never 8. How often during the last year have you been unable to remember what happened the night before because you had been drinking?: Never 9. Have you or someone else been injured as a result of your drinking?: No 10. Has a relative or friend or a doctor or another health worker been concerned about your drinking or suggested you cut down?: No Alcohol Use Disorder Identification Test Final Score (AUDIT): 5 Brief Intervention: AUDIT score less than 7 or less-screening does not suggest unhealthy drinking-brief intervention not indicated Substance Abuse History in the last 12 months:  Yes.  - opioid abuse - when ever he can get it - unable to state how much, ongoing for several years. Xanax abuse on and off , mild. UDS pos - BZD. Consequences of Substance Abuse: Medical Consequences:  admissions Family Consequences:  relational issues due to aggression Previous Psychotropic Medications: Yes  Psychological Evaluations: No  Past Medical History:  Past Medical History  Diagnosis Date  . Polysubstance abuse     opiates, benzos,  marijuana   History reviewed. No pertinent past surgical history. Family History:  Family History  Problem Relation Age of Onset  . Mental illness Neg Hx   . Drug abuse Other    Family Psychiatric  History: Pt reports that drug abuse runs in his family. Tobacco Screening:1 ppd - offered nicotine patch Social History:  History  Alcohol Use  . 3.0 oz/week  . 5 Cans of beer per week     History  Drug Use  . Yes  . Special: Marijuana, Hydrocodone, Oxycodone, Benzodiazepines    Comment: pain pills, opiates, benzos    Additional Social History:      Pain Medications: Oxycodone & hydrocodone Prescriptions: See [facility] d/c med list-Non-compliance. Pt states "it ran out", pt did not get strattera filled due to price Over the Counter: None Reported History of alcohol / drug use?: Yes Longest period of sobriety (when/how long): 10 days Negative Consequences of Use: Work / Programmer, multimediachool, Personal relationships Withdrawal Symptoms: Irritability   Name of  Substance 2: Oxycodone & Hydrocodone 2 - Age of First Use: 25 years of age 86 - Amount (size/oz): "usually a couple or Animal nutritionist" 2 - Frequency: "most of the time every day" 2 - Duration: Ongoing 2 - Last Use / Amount: 3.31.17, 3 10mg  Oxycodone pills Name of Substance 3: Xanax 3 - Age of First Use: Teens 3 - Amount (size/oz): 1mg  3 - Frequency: "it aint very usual" 3 - Duration: Ongoing 3 - Last Use / Amount: 4 days ago              Allergies:   Allergies  Allergen Reactions  . Adhesive [Tape]     sensitivity   Lab Results: No results found for this or any previous visit (from the past 48 hour(s)).  Blood Alcohol level:  Lab Results  Component Value Date   ETH <5 05/04/2015   ETH 67* 04/26/2015    Metabolic Disorder Labs:  Lab Results  Component Value Date   HGBA1C 5.3 04/09/2015   MPG 105 04/09/2015   No results found for: PROLACTIN Lab Results  Component Value Date   CHOL 130 04/09/2015   TRIG 72 04/09/2015   HDL  31* 04/09/2015   CHOLHDL 4.2 04/09/2015   VLDL 14 04/09/2015   LDLCALC 85 04/09/2015    Current Medications: Current Facility-Administered Medications  Medication Dose Route Frequency Provider Last Rate Last Dose  . acetaminophen (TYLENOL) tablet 650 mg  650 mg Oral Q6H PRN Thermon Leyland, NP      . alum & mag hydroxide-simeth (MAALOX/MYLANTA) 200-200-20 MG/5ML suspension 30 mL  30 mL Oral Q4H PRN Thermon Leyland, NP      . benztropine (COGENTIN) tablet 0.5 mg  0.5 mg Oral QHS Dossie Swor, MD      . chlordiazePOXIDE (LIBRIUM) capsule 25 mg  25 mg Oral QID PRN Jomarie Longs, MD      . cloNIDine (CATAPRES) tablet 0.1 mg  0.1 mg Oral QID Jomarie Longs, MD       Followed by  . [START ON 05/09/2015] cloNIDine (CATAPRES) tablet 0.1 mg  0.1 mg Oral BH-qamhs Jomarie Longs, MD       Followed by  . [START ON 05/11/2015] cloNIDine (CATAPRES) tablet 0.1 mg  0.1 mg Oral QAC breakfast Michael Whitter, MD      . dicyclomine (BENTYL) tablet 20 mg  20 mg Oral Q6H PRN Rechel Delosreyes, MD      . feeding supplement (ENSURE ENLIVE) (ENSURE ENLIVE) liquid 237 mL  237 mL Oral BID BM Michael Mcvay, MD   237 mL at 05/07/15 1125  . [START ON 05/08/2015] FLUoxetine (PROZAC) capsule 20 mg  20 mg Oral Daily Jazzlin Clements, MD      . hydrOXYzine (ATARAX/VISTARIL) tablet 25 mg  25 mg Oral Q6H PRN Jomarie Longs, MD      . loperamide (IMODIUM) capsule 2-4 mg  2-4 mg Oral PRN Jomarie Longs, MD      . magnesium hydroxide (MILK OF MAGNESIA) suspension 30 mL  30 mL Oral Daily PRN Thermon Leyland, NP      . methocarbamol (ROBAXIN) tablet 500 mg  500 mg Oral Q8H PRN Jerick Khachatryan, MD      . naproxen (NAPROSYN) tablet 500 mg  500 mg Oral BID PRN Jomarie Longs, MD      . nicotine (NICODERM CQ - dosed in mg/24 hours) patch 21 mg  21 mg Transdermal Daily Jomarie Longs, MD   21 mg at 05/07/15 0729  . OLANZapine (ZYPREXA)  tablet 20 mg  20 mg Oral QHS Phillippa Straub, MD      . ondansetron (ZOFRAN-ODT) disintegrating tablet 4 mg  4 mg  Oral Q6H PRN Jomarie Longs, MD      . traZODone (DESYREL) tablet 50 mg  50 mg Oral QHS PRN Thermon Leyland, NP   50 mg at 05/06/15 2118   PTA Medications: Prescriptions prior to admission  Medication Sig Dispense Refill Last Dose  . citalopram (CELEXA) 20 MG tablet Take 1 tablet (20 mg total) by mouth daily. 30 tablet 0 unknown  . hydrOXYzine (ATARAX/VISTARIL) 50 MG tablet Take 1 tablet (50 mg total) by mouth every 6 (six) hours as needed for anxiety. 30 tablet 0 unknown  . OLANZapine (ZYPREXA) 15 MG tablet Take 1 tablet (15 mg total) by mouth at bedtime. 30 tablet 0 unknown  . traZODone (DESYREL) 50 MG tablet Take 1 tablet (50 mg total) by mouth at bedtime as needed for sleep. 30 tablet 0 unknown    Musculoskeletal: Strength & Muscle Tone: within normal limits Gait & Station: normal Patient leans: N/A  Psychiatric Specialty Exam: Physical Exam  Nursing note and vitals reviewed. Constitutional:  I concur with PE done in ED.    Review of Systems  Psychiatric/Behavioral: Positive for depression, suicidal ideas, hallucinations and substance abuse. The patient is nervous/anxious.   All other systems reviewed and are negative.   Blood pressure 130/63, pulse 76, temperature 97.7 F (36.5 C), temperature source Oral, resp. rate 18, height  (1.676 m), weight 70.308 kg (155 lb).Body mass index is 25.03 kg/(m^2).  General Appearance: Disheveled  Eye Solicitor::  Fair  Speech:  Normal Rate  Volume:  Decreased  Mood:  Anxious and Depressed  Affect:  Depressed  Thought Process:  Linear  Orientation:  Full (Time, Place, and Person)  Thought Content:  Hallucinations: Auditory Visual and Rumination  Suicidal Thoughts:  Yes.  without intent/plan  Homicidal Thoughts:  No  Memory:  Immediate;   Fair Recent;   Fair Remote;   Fair  Judgement:  Fair  Insight:  Shallow  Psychomotor Activity:  Normal  Concentration:  Fair  Recall:  [employer] of Knowledge:Fair  Language: Fair  Akathisia:   No  Handed:  Right  AIMS (if indicated):     Assets:  Desire for Improvement  ADL's:  Intact  Cognition: WNL  Sleep:  Number of Hours: 4.25     Treatment Plan Summary:Clee Mckeithan is a 25 y.o. Caucasian male, who is single , unemployed who lives with his mother in [place], [place] , who has a hx of depression as well as substance abuse , who presented to [facility] , IVCed by his mother for hallucinations as well as being aggressive at home.Pt will need inpatient treatment.   Daily contact with patient to assess and evaluate symptoms and progress in treatment and Medication management   Patient will benefit from inpatient treatment and stabilization.  Estimated length of stay is 5-7 days.  Reviewed past medical records,treatment plan.  Will start a trial of Prozac 20 mg po daily for affective sx. Will discontinue Celexa for lack of efficacy. Will increase Zyprexa to 20 mg po qhs for psychosis/mood sx. Will add Cogentin 0.5 mg po qhs for EPS. Will start CIWA/LIBRIUM prn for BZD withdrawal sx. Will start COWS/clonidine protocol for opioid abuse. Pt is motivated to go to a substance abuse treatment program.CSW will start working on disposition Will continue to monitor vitals ,medication compliance and treatment side effects  while patient is here.  Will monitor for medical issues as well as call consult as needed.  Reviewed labs cbc - wnl, cmp - wnl, UDS - pos for BZD, BAL <5, TSH, LIPID PANEL, HBA1C ( 04/09/15) -wnl, will get EKG for qtc, PL. Patient to participate in therapeutic milieu .       Observation Level/Precautions:  15 minute checks    Psychotherapy:  Individual and group therapy     Consultations: social worker  Discharge Concerns:  stability and safety        I certify that inpatient services furnished can reasonably be expected to improve the patient's condition.    Jomarie Longs, MD 4/4/20171:07 PM

## 2015-05-07 NOTE — Progress Notes (Signed)
NUTRITION ASSESSMENT  Pt identified as at risk on the Malnutrition Screen Tool  INTERVENTION: 1. Supplements: Continue Ensure Enlive po BID, each supplement provides 350 kcal and 20 grams of protein  NUTRITION DIAGNOSIS: Unintentional weight loss related to sub-optimal intake as evidenced by pt report.   Goal: Pt to meet >/= 90% of their estimated nutrition needs.  Monitor:  PO intake  Assessment:  Pt admitted with polysubstance abuse (benzos, ETOH) and depression. Pt was recently admitted 1 month ago. Since that admission his weight has increased. Ensure supplements have been ordered BID.    Height: Ht Readings from Last 1 Encounters:  05/06/15 5\' 6"  (1.676 m)    Weight: Wt Readings from Last 1 Encounters:  05/06/15 155 lb (70.308 kg)    Weight Hx: Wt Readings from Last 10 Encounters:  05/06/15 155 lb (70.308 kg)  05/04/15 155 lb (70.308 kg)  04/07/15 143 lb (64.864 kg)  04/07/15 158 lb (71.668 kg)  03/04/15 154 lb (69.854 kg)  03/04/15 154 lb (69.854 kg)  12/01/13 156 lb (70.761 kg)  10/09/13 127 lb (57.607 kg)  09/05/13 156 lb (70.761 kg)  05/10/13 165 lb (74.844 kg)    BMI:  Body mass index is 25.03 kg/(m^2). Pt meets criteria for overweight based on current BMI.  Estimated Nutritional Needs: Kcal: 25-30 kcal/kg Protein: > 1 gram protein/kg Fluid: 1 ml/kcal  Diet Order: Diet regular Room service appropriate?: Yes; Fluid consistency:: Thin Pt is also offered choice of unit snacks mid-morning and mid-afternoon.  Pt is eating as desired.   Lab results and medications reviewed.   Michael FrancoLindsey Michael Dubin, MS, RD, LDN Pager: [phone] After Hours Pager: [phone]

## 2015-05-08 NOTE — Progress Notes (Signed)
D: Pt presents anxious in affect and mood. Pt is visible and active within the milieu. Pt denies any current SI/HI/AVH. Pt is complaint with his current POC.  A: Writer administered scheduled and prn medications to pt, per MD orders. Continued support and availability as needed was extended to this pt. Staff continues to monitor pt with q315min checks.  R: No adverse drug reactions noted. Pt receptive to treatment. Pt remains safe at this time.

## 2015-05-08 NOTE — Progress Notes (Signed)
Patient ID: Michael Costa, male   DOB: Jun 04, 1990, 25 y.o.   MRN: [id number] [facility] MD Progress Note  05/08/2015 3:07 PM Michael Costa  MRN:  [id number] Subjective:  Patient states " I feel better.'   Objective : Michael Costa is a 25 y.o. Caucasian male, who is single , unemployed who lives with his mother in [place], [place] , who has a hx of depression as well as substance abuse , who presented to [facility] , IVCed by his mother for hallucinations as well as being aggressive at home.  I have discussed case with treatment team and have met with patient . Pt today continues to be depressed, although improving. Pt reports that his AH/VH is less frustrating. As discussed with staff, patient going to some groups, but at other times isolates in room. Pt continues to be motivated to go to a substance abuse program.     Principal Problem: MDD (major depressive disorder), recurrent, severe, with psychosis ([place]) Diagnosis:   Patient Active Problem List   Diagnosis Date Noted  . MDD (major depressive disorder), recurrent, severe, with psychosis ([place]) [F33.3] 04/07/2015  . Opioid use disorder, moderate, dependence ([place]) [F11.20] 03/05/2015  . Alcohol use disorder, mild, abuse [F10.10] 03/05/2015  . 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 Tobacco use disorder [F17.200] 03/05/2015   Total Time spent with patient:  25 minutes     Past Medical History:  Past Medical History  Diagnosis Date  . Polysubstance abuse     opiates, benzos, marijuana   History reviewed. No pertinent past surgical history. Family History:  Family History  Problem Relation Age of Onset  . Mental illness Neg Hx   . Drug abuse Other     Social History:  History  Alcohol Use  . 3.0 oz/week  . 5 Cans of beer per week     History  Drug Use  . Yes  . Special: Marijuana, Hydrocodone, Oxycodone, Benzodiazepines    Comment: pain pills, opiates, benzos    Social History   Social History  . Marital Status:  Single    Spouse Name: N/A  . Number of Children: N/A  . Years of Education: N/A   Social History Main Topics  . Smoking status: Current Every Day Smoker -- 2.00 packs/day    Types: Cigarettes  . Smokeless tobacco: None  . Alcohol Use: 3.0 oz/week    5 Cans of beer per week  . Drug Use: Yes    Special: Marijuana, Hydrocodone, Oxycodone, Benzodiazepines     Comment: pain pills, opiates, benzos  . Sexual Activity: Not Currently    Birth Control/ Protection: None   Other Topics Concern  . None   Social History Narrative   Additional Social History:    Pain Medications: Oxycodone & hydrocodone Prescriptions: See [place] d/c med list-Non-compliance. Pt states "it ran out", pt did not get strattera filled due to price Over the Counter: None Reported History of alcohol / drug use?: Yes Longest period of sobriety (when/how long): 10 days Negative Consequences of Use: Work / Youth worker, Charity fundraiser relationships Withdrawal Symptoms: Irritability   Name of Substance 2: Oxycodone & Hydrocodone 2 - Age of First Use: 25 years of age 34 - Amount (size/oz): "usually a couple or Production manager" 2 - Frequency: "most of the time every day" 2 - Duration: Ongoing 2 - Last Use / Amount: 3.31.17, 3 62m Oxycodone pills  Sleep: Fair  Appetite:   Improving   Current Medications: Current Facility-Administered Medications  Medication Dose Route  Frequency Provider Last Rate Last Dose  . acetaminophen (TYLENOL) tablet 650 mg  650 mg Oral Q6H PRN Michael Hummer, NP   650 mg at 05/08/15 0647  . alum & mag hydroxide-simeth (MAALOX/MYLANTA) 200-200-20 MG/5ML suspension 30 mL  30 mL Oral Q4H PRN Michael Hummer, NP      . benztropine (COGENTIN) tablet 0.5 mg  0.5 mg Oral QHS Michael Alert, MD   0.5 mg at 05/07/15 2119  . chlordiazePOXIDE (LIBRIUM) capsule 25 mg  25 mg Oral QID PRN Michael Alert, MD      . cloNIDine (CATAPRES) tablet 0.1 mg  0.1 mg Oral QID Michael Alert, MD   0.1 mg at 05/08/15 1321   Followed by   . [START ON 05/09/2015] cloNIDine (CATAPRES) tablet 0.1 mg  0.1 mg Oral BH-qamhs Michael Alert, MD       Followed by  . [START ON 05/11/2015] cloNIDine (CATAPRES) tablet 0.1 mg  0.1 mg Oral QAC breakfast Michael Beneke, MD      . dicyclomine (BENTYL) tablet 20 mg  20 mg Oral Q6H PRN Michael Soucek, MD      . feeding supplement (ENSURE ENLIVE) (ENSURE ENLIVE) liquid 237 mL  237 mL Oral BID BM Michael Naas, MD   237 mL at 05/08/15 0950  . FLUoxetine (PROZAC) capsule 20 mg  20 mg Oral Daily Michael Alert, MD   20 mg at 05/08/15 0758  . hydrOXYzine (ATARAX/VISTARIL) tablet 25 mg  25 mg Oral Q6H PRN Michael Alert, MD   25 mg at 05/07/15 1833  . loperamide (IMODIUM) capsule 2-4 mg  2-4 mg Oral PRN Michael Alert, MD      . magnesium hydroxide (MILK OF MAGNESIA) suspension 30 mL  30 mL Oral Daily PRN Michael Hummer, NP      . methocarbamol (ROBAXIN) tablet 500 mg  500 mg Oral Q8H PRN Michael Golda, MD      . naproxen (NAPROSYN) tablet 500 mg  500 mg Oral BID PRN Michael Alert, MD      . nicotine (NICODERM CQ - dosed in mg/24 hours) patch 21 mg  21 mg Transdermal Daily Michael Alert, MD   21 mg at 05/08/15 0758  . OLANZapine (ZYPREXA) tablet 20 mg  20 mg Oral QHS Michael Alert, MD   20 mg at 05/07/15 2118  . ondansetron (ZOFRAN-ODT) disintegrating tablet 4 mg  4 mg Oral Q6H PRN Michael Alert, MD      . traZODone (DESYREL) tablet 50 mg  50 mg Oral QHS PRN Michael Hummer, NP   50 mg at 05/07/15 2131    Lab Results:  No results found for this or any previous visit (from the past 48 hour(s)).  Blood Alcohol level:  Lab Results  Component Value Date   ETH <5 05/04/2015   ETH 67* 04/26/2015    Physical Findings: AIMS: Facial and Oral Movements Muscles of Facial Expression: None, normal Lips and Perioral Area: None, normal Jaw: None, normal Tongue: None, normal,Extremity Movements Upper (arms, wrists, hands, fingers): None, normal Lower (legs, knees, ankles, toes): None, normal, Trunk  Movements Neck, shoulders, hips: None, normal, Overall Severity Severity of abnormal movements (highest score from questions above): None, normal Incapacitation due to abnormal movements: None, normal Patient's awareness of abnormal movements (rate only patient's report): No Awareness, Dental Status Current problems with teeth and/or dentures?: No Does patient usually wear dentures?: No  CIWA:  CIWA-Ar Total: 0 COWS:  COWS Total Score: 0  Musculoskeletal: Strength & Muscle Tone:  within normal limits Gait & Station: normal Patient leans: N/A  Psychiatric Specialty Exam: Review of Systems  Psychiatric/Behavioral: Positive for depression, hallucinations and substance abuse. The patient is nervous/anxious.   All other systems reviewed and are negative.  denies headache, denies chest pain, denies shortness of breath, no vomiting  Blood pressure 108/73, pulse 63, temperature 97.6 F (36.4 C), temperature source Oral, resp. rate 16, height 5' 6"  (1.676 m), weight 70.308 kg (155 lb).Body mass index is 25.03 kg/(m^2).  General Appearance:casual  Eye Contact::  Improved   Speech:  Normal Rate  Volume:  Normal  Mood:  At this time reports improved mood, but still depressed   Affect: more reactive at this time but as per notes irritable and negative at times, no inappropriate affect noted at this time   Thought Process:  generally linear  Orientation:  Full (Time, Place, and Person)  Thought Content:   (+) auditory hallucinations and VH of red lights   Suicidal Thoughts:  No at this time denies any suicidal or self injurious ideations   Homicidal Thoughts:  No  Memory:  recent and remote grossly intact , immediate - fair  Judgement:  Fair but is improving   Insight:   Limited   Psychomotor Activity:  Less restless , no agitation at this time   Concentration:  Fair  Recall:  Good  Fund of Knowledge:Good  Language: Good  Akathisia:  Negative  Handed:  Right  AIMS (if indicated):      Assets:  Desire for Improvement Resilience  ADL's:  Fair   Cognition: WNL  Sleep:  Number of Hours: 6.75    Assessment- Jahden Schara is a 25 y.o. Caucasian male, who is single , unemployed who lives with his mother in [place], [place] , who has a hx of depression as well as substance abuse , who presented to [facility] , IVCed by his mother for hallucinations as well as being aggressive at home.Pt is progressing, reports AH/VH as improving. Continues to be motivated to go to a substance abuse program.     Treatment Plan Summary: Daily contact with patient to assess and evaluate symptoms and progress in treatment, Medication management, Plan inpatient treatment  and medications as below    Will continue Prozac 20 mg po daily for affective sx. Will continue Zyprexa to 20 mg po qhs for psychosis/mood sx. Will continue Cogentin 0.5 mg po qhs for EPS. Will continue CIWA/LIBRIUM prn for BZD withdrawal sx. Will continue COWS/clonidine protocol for opioid abuse. Pt is motivated to go to a substance abuse treatment program.CSW will continue working on disposition Will continue to monitor vitals ,medication compliance and treatment side effects while patient is here.  Will monitor for medical issues as well as call consult as needed.  Reviewed labs EKG for qtc- wnl ,pending PL. Patient to participate in therapeutic milieu .        Tanica Gaige, MD 05/08/2015, 3:07 PM

## 2015-05-08 NOTE — [facility] Group Notes (Signed)
[facility]   05/08/2015 2:34 PM  Participation Quality:  Engaged  Mood/Affect:  Flat  Depression Rating:    Anxiety Rating:    Thoughts of Suicide:  No Will you contract for safety?   NA  Current AVH:  No  Plan for Discharge/Comments:  States symptoms are improving a lot.  Slept well, appetite is good.  Confirmed that he is still interested in rehab.  [facility]  Transportation Means:   Supports:  Daryel GeraldNorth, Jarod Bozzo B

## 2015-05-08 NOTE — [facility] Group Notes (Signed)
[facility] LCSW Group Therapy  05/08/2015 1:47 PM  Type of Therapy: Group Therapy  Participation Level: Active  Participation Quality: Attentive  Affect: Flat  Cognitive: Oriented  Insight: Limited  Engagement in Therapy: Engaged  Modes of Intervention: Discussion and Socialization  Summary of Progress/Problems: Michael Costa from [employer] was here to tell his story of recovery and play his guitar.  Pleasant and alert for the duration of group. Stay the entire time.  Michael Costa 05/08/2015 1:47 PM

## 2015-05-08 NOTE — Progress Notes (Signed)
D- Patient has been in his room for the majority of the shift resting. Patient had no complaints of withdrawal but stated that his anxiety has been high.  Patient tried librium for anxiety this shift and reported more relief from his anxiety than with vistaril. Patient denies SI, HI , and AVH.  A- Assess patient for safety, offer medications as prescribed, engage patient in 1:1 staff talks  R- Patient able to contract for safety.

## 2015-05-08 NOTE — Tx Team (Signed)
Interdisciplinary Treatment Plan Update (Adult)  Date:  05/08/2015   Time Reviewed:  8:41 AM   Progress in Treatment: Attending groups: Yes. Participating in groups:  Yes. Taking medication as prescribed:  Yes. Tolerating medication:  Yes. Family/Significant other contact made:  No Patient understands diagnosis:  Yes  As evidenced by seeking help with anxiety, psychosis, substance abuse Discussing patient identified problems/goals with staff:  Yes, see initial care plan. Medical problems stabilized or resolved:  Yes. Denies suicidal/homicidal ideation: Yes. Issues/concerns per patient self-inventory:  No. Other:  New problem(s) identified:  Discharge Plan or Barriers: see below  Reason for Continuation of Hospitalization: Anxiety Depression Hallucinations Medication stabilization Withdrawal symptoms  Comments:  Michael Costa is a 25 y.o. Caucasian male, single , unemployed, who lives with his mother in [place], [place].He has a hx of depression as well as substance abuse , who presented to [facility] , IVCed by his mother for hallucinations as well as being aggressive at home.  Will start a trial of Prozac 20 mg po daily for affective sx. Will discontinue Celexa for lack of efficacy. Will increase Zyprexa to 20 mg po qhs for psychosis/mood sx. Will add Cogentin 0.5 mg po qhs for EPS. Will start CIWA/LIBRIUM prn for BZD withdrawal sx. Will start COWS/clonidine protocol for opioid abuse. Pt is motivated to go to a substance abuse treatment program.CSW will start working on disposition  Estimated length of stay: 4-5 days  New goal(s):  Review of initial/current patient goals per problem list:   Review of initial/current patient goals per problem list:  1. Goal(s): Patient will participate in aftercare plan   Met: No   Target date: 3-5 days post admission date   As evidenced by: Patient will participate within aftercare plan AEB aftercare provider and housing plan at discharge being  identified. 05/08/15:  Hopes to get into [facility]-back up plan is return home and follow up outpt   2. Goal (s): Patient will exhibit decreased depressive symptoms and suicidal ideations.   Met: No   Target date: 3-5 days post admission date   As evidenced by: Patient will utilize self rating of depression at 3 or below and demonstrate decreased signs of depression or be deemed stable for discharge by MD. 05/08/15:  Pt rates his depression a 5 today     3. Goal(s): Patient will demonstrate decreased signs and symptoms of anxiety.   Met: No   Target date: 3-5 days post admission date   As evidenced by: Patient will utilize self rating of anxiety at 3 or below and demonstrated decreased signs of anxiety, or be deemed stable for discharge by MD 05/08/15:  Pt rates his anxiety a 6 today     4. Goal(s): Patient will demonstrate decreased signs of withdrawal due to substance abuse   Met: Progressing   Target date: 3-5 days post admission date   As evidenced by: Patient will produce a CIWA/COWS score of 0, have stable vitals signs, and no symptoms of withdrawal 05/08/15:  Minimal symptoms-COWS score is a 1     5. Goal(s): Patient will demonstrate decreased signs of psychosis  * Met: No  * Target date: 3-5 days post admission date  * As evidenced by: Patient will demonstrate decreased frequency of AVH or return to baseline function 05/08/15: Prior to admission, pt was c/o AH/VH, and his mother was c/o anger, aggression, mood lability.  He is willing to engage in a Zyprexa trial          Attendees:   Patient:  05/08/2015 8:41 AM   Family:   05/08/2015 8:41 AM   Physician:  Ursula Alert, MD 05/08/2015 8:41 AM   Nursing:   Eulogio Bear, RN 05/08/2015 8:41 AM   CSW:    Roque Lias, LCSW   05/08/2015 8:41 AM   Other:  05/08/2015 8:41 AM   Other:   05/08/2015 8:41 AM   Other:  Lars Pinks, Nurse CM 05/08/2015 8:41 AM   Other:   05/08/2015 8:41 AM   Other:  Norberto Sorenson, [place]   05/08/2015 8:41 AM   Other:  05/08/2015 8:41 AM   Other:  05/08/2015 8:41 AM   Other:  05/08/2015 8:41 AM   Other:  05/08/2015 8:41 AM   Other:  05/08/2015 8:41 AM   Other:   05/08/2015 8:41 AM    Scribe for Treatment Team:   Trish Mage, 05/08/2015 8:41 AM

## 2015-05-08 NOTE — [facility] Suicide Risk Assessment (Signed)
[facility] INPATIENT:  Family/Significant Other Suicide Prevention Education  Suicide Prevention Education:  Education Completed; April Fulp, [phone], mother has been identified by the patient as the family member/significant other with whom the patient will be residing, and identified as the person(s) who will aid the patient in the event of a mental health crisis (suicidal ideations/suicide attempt).  With written consent from the patient, the family member/significant other has been provided the following suicide prevention education, prior to the and/or following the discharge of the patient.  The suicide prevention education provided includes the following:  Suicide risk factors  Suicide prevention and interventions  National Suicide Hotline telephone number  [facility] assessment telephone number  [facility] Emergency Assistance 911  [facility] and/or Residential Mobile Crisis Unit telephone number  Request made of family/significant other to:  Remove weapons (e.g., guns, rifles, knives), all items previously/currently identified as safety concern.    Remove drugs/medications (over-the-counter, prescriptions, illicit drugs), all items previously/currently identified as a safety concern.  The family member/significant other verbalizes understanding of the suicide prevention education information provided.  The family member/significant other agrees to remove the items of safety concern listed above.  Daryel Geraldorth, Tavon Magnussen B 05/08/2015, 5:00 PM

## 2015-05-09 LAB — PROLACTIN: Prolactin: 46.1 ng/mL — ABNORMAL HIGH (ref 4.0–15.2)

## 2015-05-09 MED ORDER — OLANZAPINE 10 MG PO TABS
25.0000 mg | ORAL_TABLET | Freq: Every day | ORAL | Status: DC
  Administered 2015-05-09 – 2015-05-14 (×6): 25 mg via ORAL
  Filled 2015-05-09 (×6): qty 1
  Filled 2015-05-09: qty 2
  Filled 2015-05-09 (×2): qty 1

## 2015-05-09 MED ORDER — FLUOXETINE HCL 20 MG PO CAPS
40.0000 mg | ORAL_CAPSULE | Freq: Every day | ORAL | Status: DC
  Administered 2015-05-10 – 2015-05-13 (×4): 40 mg via ORAL
  Filled 2015-05-09 (×6): qty 2

## 2015-05-09 NOTE — [facility] Group Notes (Signed)
[facility] Group Notes:  (Counselor/Nursing/MHT/Case Management/Adjunct)  05/09/2015 1:15PM  Type of Therapy:  Group Therapy  Participation Level:  Active  Participation Quality:  Appropriate  Affect:  Flat  Cognitive:  Oriented  Insight:  Improving  Engagement in Group:  Limited  Engagement in Therapy:  Limited  Modes of Intervention:  Discussion, Exploration and Socialization  Summary of Progress/Problems: The topic for group was balance in life.  Pt participated in the discussion about when their life was in balance and out of balance and how this feels.  Pt discussed ways to get back in balance and short term goals they can work on to get where they want to be.  Invited.  Chose to not attend.   Michael Costa, Michael Costa B 05/09/2015 12:59 PM

## 2015-05-09 NOTE — Progress Notes (Signed)
Patient ID: Michael Costa, male   DOB: Jan 21, 1991, 25 y.o.   MRN: [id number] [facility] MD Progress Note  05/09/2015 3:01 PM Debbie Bellucci  MRN:  [id number] Subjective:  Patient states " I am not doing well , I am still hearing voices, I am not able to concentrate well , I was treated for ADHD in the past, can I be on Strattera ?."    Objective : Michael Costa is a 25 y.o. Caucasian male, who is single , unemployed who lives with his mother in [place], [place] , who has a hx of depression as well as substance abuse , who presented to [place] , IVCed by his mother for hallucinations as well as being aggressive at home.  I have discussed case with treatment team and have met with patient . Pt today continues to be depressed, anxious , continues to have AH. Pt also with anxiety sx as well as inability to concentrate well. Discussed Strattera with patient - pt reported past hx of ADHD. However , pt reports he is unable to afford it . He wonders about Adderall - discussed that since he has a hx of substance abuse - that may not be the right choice . However , since he is interested in going to a substance abuse program , he could follow up with an out patient provider once he is sober . He may also need ADHD testing done - if that has not been done before.     Principal Problem: MDD (major depressive disorder), recurrent, severe, with psychosis ([place]) Diagnosis:   Patient Active Problem List   Diagnosis Date Noted  . MDD (major depressive disorder), recurrent, severe, with psychosis ([place]) [F33.3] 04/07/2015  . Opioid use disorder, moderate, dependence ([place]) [F11.20] 03/05/2015  . Alcohol use disorder, mild, abuse [F10.10] 03/05/2015  . 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 Tobacco use disorder [F17.200] 03/05/2015   Total Time spent with patient:  25 minutes     Past Medical History:  Past Medical History  Diagnosis Date  . Polysubstance abuse     opiates, benzos, marijuana   History  reviewed. No pertinent past surgical history. Family History:  Family History  Problem Relation Age of Onset  . Mental illness Neg Hx   . Drug abuse Other     Social History:  History  Alcohol Use  . 3.0 oz/week  . 5 Cans of beer per week     History  Drug Use  . Yes  . Special: Marijuana, Hydrocodone, Oxycodone, Benzodiazepines    Comment: pain pills, opiates, benzos    Social History   Social History  . Marital Status: Single    Spouse Name: N/A  . Number of Children: N/A  . Years of Education: N/A   Social History Main Topics  . Smoking status: Current Every Day Smoker -- 2.00 packs/day    Types: Cigarettes  . Smokeless tobacco: None  . Alcohol Use: 3.0 oz/week    5 Cans of beer per week  . Drug Use: Yes    Special: Marijuana, Hydrocodone, Oxycodone, Benzodiazepines     Comment: pain pills, opiates, benzos  . Sexual Activity: Not Currently    Birth Control/ Protection: None   Other Topics Concern  . None   Social History Narrative   Additional Social History:    Pain Medications: Oxycodone & hydrocodone Prescriptions: See [place] d/c med list-Non-compliance. Pt states "it ran out", pt did not get strattera filled due to price Over the Counter:  None Reported History of alcohol / drug use?: Yes Longest period of sobriety (when/how long): 10 days Negative Consequences of Use: Work / Youth worker, Charity fundraiser relationships Withdrawal Symptoms: Irritability   Name of Substance 2: Oxycodone & Hydrocodone 2 - Age of First Use: 25 years of age 34 - Amount (size/oz): "usually a couple or Production manager" 2 - Frequency: "most of the time every day" 2 - Duration: Ongoing 2 - Last Use / Amount: 3.31.17, 3 27m Oxycodone pills  Sleep: Fair  Appetite:   Improving   Current Medications: Current Facility-Administered Medications  Medication Dose Route Frequency Provider Last Rate Last Dose  . acetaminophen (TYLENOL) tablet 650 mg  650 mg Oral Q6H PRN LNiel Hummer NP   650 mg at  05/08/15 0647  . alum & mag hydroxide-simeth (MAALOX/MYLANTA) 200-200-20 MG/5ML suspension 30 mL  30 mL Oral Q4H PRN LNiel Hummer NP      . benztropine (COGENTIN) tablet 0.5 mg  0.5 mg Oral QHS SUrsula Alert MD   0.5 mg at 05/08/15 2108  . chlordiazePOXIDE (LIBRIUM) capsule 25 mg  25 mg Oral QID PRN SUrsula Alert MD   25 mg at 05/09/15 1444  . cloNIDine (CATAPRES) tablet 0.1 mg  0.1 mg Oral BH-qamhs Esbeidy Mclaine, MD   0.1 mg at 05/09/15 0808   Followed by  . [START ON 05/11/2015] cloNIDine (CATAPRES) tablet 0.1 mg  0.1 mg Oral QAC breakfast Jamayia Croker, MD      . dicyclomine (BENTYL) tablet 20 mg  20 mg Oral Q6H PRN Tanis Burnley, MD      . feeding supplement (ENSURE ENLIVE) (ENSURE ENLIVE) liquid 237 mL  237 mL Oral BID BM Monti Jilek, MD   237 mL at 05/09/15 1444  . [START ON 05/10/2015] FLUoxetine (PROZAC) capsule 40 mg  40 mg Oral Daily Tysheem Accardo, MD      . hydrOXYzine (ATARAX/VISTARIL) tablet 25 mg  25 mg Oral Q6H PRN SUrsula Alert MD   25 mg at 05/08/15 1949  . loperamide (IMODIUM) capsule 2-4 mg  2-4 mg Oral PRN SUrsula Alert MD      . magnesium hydroxide (MILK OF MAGNESIA) suspension 30 mL  30 mL Oral Daily PRN LNiel Hummer NP      . methocarbamol (ROBAXIN) tablet 500 mg  500 mg Oral Q8H PRN Thaily Hackworth, MD      . naproxen (NAPROSYN) tablet 500 mg  500 mg Oral BID PRN SUrsula Alert MD      . nicotine (NICODERM CQ - dosed in mg/24 hours) patch 21 mg  21 mg Transdermal Daily SUrsula Alert MD   21 mg at 05/09/15 0807  . OLANZapine (ZYPREXA) tablet 25 mg  25 mg Oral QHS Ashlay Altieri, MD      . ondansetron (ZOFRAN-ODT) disintegrating tablet 4 mg  4 mg Oral Q6H PRN SUrsula Alert MD      . traZODone (DESYREL) tablet 50 mg  50 mg Oral QHS PRN LNiel Hummer NP   50 mg at 05/08/15 2110    Lab Results:  Results for orders placed or performed during the hospital encounter of 05/06/15 (from the past 48 hour(s))  Prolactin     Status: Abnormal   Collection Time:  05/08/15  6:27 AM  Result Value Ref Range   Prolactin 46.1 (H) 4.0 - 15.2 ng/mL    Comment: (NOTE) [facility]formed At: [id number] RompMD PNW:[id number] at [facility]  Blood Alcohol level:  Lab Results  Component Value Date   ETH <5 05/04/2015   ETH 67* 04/26/2015    Physical Findings: AIMS: Facial and Oral Movements Muscles of Facial Expression: None, normal Lips and Perioral Area: None, normal Jaw: None, normal Tongue: None, normal,Extremity Movements Upper (arms, wrists, hands, fingers): None, normal Lower (legs, knees, ankles, toes): None, normal, Trunk Movements Neck, shoulders, hips: None, normal, Overall Severity Severity of abnormal movements (highest score from questions above): None, normal Incapacitation due to abnormal movements: None, normal Patient's awareness of abnormal movements (rate only patient's report): No Awareness, Dental Status Current problems with teeth and/or dentures?: No Does patient usually wear dentures?: No  CIWA:  CIWA-Ar Total: 0 COWS:  COWS Total Score: 2  Musculoskeletal: Strength & Muscle Tone: within normal limits Gait & Station: normal Patient leans: N/A  Psychiatric Specialty Exam: Review of Systems  Psychiatric/Behavioral: Positive for depression, hallucinations and substance abuse. The patient is nervous/anxious.   All other systems reviewed and are negative.  denies headache, denies chest pain, denies shortness of breath, no vomiting  Blood pressure 113/64, pulse 78, temperature 97.5 F (36.4 C), temperature source Oral, resp. rate 16, height _0  (1.676 m), weight 70.308 kg (155 lb).Body mass index is 25.03 kg/(m^2).  General Appearance:casual  Eye Contact::  Improved   Speech:  Normal Rate  Volume:  Normal  Mood: depressed, anxious  Affect: anxious  Thought Process:  generally linear  Orientation:  Full (Time, Place, and  Person)  Thought Content:   (+) auditory hallucinations and VH of red lights   Suicidal Thoughts:  No at this time denies any suicidal or self injurious ideations   Homicidal Thoughts:  No  Memory:  recent and remote grossly intact , immediate - fair  Judgement:  Fair but is improving   Insight:   Limited   Psychomotor Activity:  Less restless , no agitation at this time   Concentration:  Fair  Recall:  Good  Fund of Knowledge:Good  Language: Good  Akathisia:  Negative  Handed:  Right  AIMS (if indicated):     Assets:  Desire for Improvement Resilience  ADL's:  Fair   Cognition: WNL  Sleep:  Number of Hours: 6.5    Assessment- Michael Costa is a 25 y.o. Caucasian male, who is single , unemployed who lives with his mother in [place], [place] , who has a hx of depression as well as substance abuse , who presented to [place] , IVCed by his mother for hallucinations as well as being aggressive at home.Pt continues to be depressed, anxious and has AH .Continues to be motivated to go to a substance abuse program.     Treatment Plan Summary: Daily contact with patient to assess and evaluate symptoms and progress in treatment, Medication management, Plan inpatient treatment  and medications as below    Will increase Prozac to 40 mg po daily for affective sx. Will increase Zyprexa to 25 mg po qhs for psychosis/mood sx. Will continue Cogentin 0.5 mg po qhs for EPS. Will continue CIWA/LIBRIUM prn for BZD withdrawal sx. Will continue COWS/clonidine protocol for opioid abuse. Pt is motivated to go to a substance abuse treatment program.CSW will continue working on disposition Will continue to monitor vitals ,medication compliance and treatment side effects while patient is here.  Will monitor for medical issues as well as call consult as needed.  Reviewed labs EKG for qtc- wnl , PL- 46.1 - will need to monitor on an out patient  basis. Patient to participate in therapeutic milieu .  CSW will continue  to work on disposition. Pt is motivated to go to a substance abuse program.       Madalynn Pickelsimer, MD 05/09/2015, 3:01 PM

## 2015-05-09 NOTE — Progress Notes (Signed)
D-  Patient has been engaged in unit activities.  Patient reported an increase in auditory hallucination and anxiety today.  Patient denies SI, HI and VH.  Patient attended groups and was compliant with medications.  A- Assess patient for safety, offer medications as prescribed, engage patient in 1:1 staff talks.   R-  Patient able to contract for safety.

## 2015-05-10 NOTE — [facility] Group Notes (Signed)
[facility] LCSW Group Therapy  05/10/2015  1:05 PM  Type of Therapy:  Group therapy  Participation Level:  Invited. Chose not to attend.  Summary of Progress/Problems:  Chaplain was here to lead a group on themes of hope and courage.  Daryel Geraldorth, Michael Costa 05/10/2015 1:09 PM

## 2015-05-10 NOTE — [facility] Group Notes (Signed)
[facility]   05/10/2015 11:09 AM  Participation Quality:  Engaged  Mood/Affect:  Flat  Depression Rating:  5  Anxiety Rating:  5  Thoughts of Suicide:  No Will you contract for safety?   NA  Current AVH:  Yes  Plan for Discharge/Comments:  "You can't say I didn't come to group like you did yesterday."  Flat affect, states voices are continuing to diminish.  Is still interested in [facility] and hopes to go on Monday or Tuesday.  "Yes, that would work out well."  [employer] Means:   Supports:  [place], [place]

## 2015-05-10 NOTE — [facility] Group Notes (Signed)
Patient did attend group. He said his day was a 7. He is working on his goal he did not meet his goal.

## 2015-05-10 NOTE — Progress Notes (Signed)
Patient has been spending the majority of the shift in his room Patient stated "I feel like I am starting to get ill"  Patient endorses positive auditory and states that he sees lights flashing.  Patient states that these things are real.  Patient denies SI, HI and AVH.  Patient has been assessed for safety, medications offered and engaged patient in 1:1 staff talks.  Continue to monitor as prescribed.

## 2015-05-10 NOTE — Plan of Care (Signed)
Problem: Alteration in mood & ability to function due to Goal: STG: Patient verbalizes decreases in signs of withdrawal Outcome: Progressing Pt denies any active withdrawal symptoms.

## 2015-05-10 NOTE — Progress Notes (Addendum)
Adult Psychoeducational Group Note  Date:  05/10/2015 Time:  8:53 PM  Group Topic/Focus:  Wrap-Up Group:   The focus of this group is to help patients review their daily goal of treatment and discuss progress on daily workbooks.  Participation Level:  Active  Participation Quality:  Appropriate  Affect:  Appropriate  Cognitive:  Appropriate  Insight: Good  Engagement in Group:  Engaged  Modes of Intervention:  Activity  Additional Comments:  Patient rated his day a 4. Goal is to try to stay awake and be more active.  Natasha MeadKiara M Jivan Symanski 05/10/2015, 8:53 PM

## 2015-05-10 NOTE — Progress Notes (Signed)
Patient ID: Michael Costa, male   DOB: 1990/12/02, 25 y.o.   MRN: [id number] [facility] MD Progress Note  05/10/2015 11:36 AM Michael Costa  MRN:  [id number] Subjective:  Patient states " I am OK. I still have voices and see the light , but I am able to better cope .'     Objective : Michael Costa is a 25 y.o. Caucasian male, who is single , unemployed who lives with his mother in [place], [place] , who has a hx of depression as well as substance abuse , who presented to [facility] , IVCed by his mother for hallucinations as well as being aggressive at home.  I have discussed case with treatment team and have met with patient . Pt today continues to be depressed, anxious , continues to have AH, but relates that he is able to cope with it. Pt also reports on and off anxiety sx. Discussed with pt that his medications has been increased and that he will receive his higher dose today. Per staff pt continues to be depressed, mostly withdrawn , will continue to need encouragement.    Principal Problem: MDD (major depressive disorder), recurrent, severe, with psychosis ([place]) Diagnosis:   Patient Active Problem List   Diagnosis Date Noted  . MDD (major depressive disorder), recurrent, severe, with psychosis ([place]) [F33.3] 04/07/2015  . Opioid use disorder, moderate, dependence ([place]) [F11.20] 03/05/2015  . Alcohol use disorder, mild, abuse [F10.10] 03/05/2015  . 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 Tobacco use disorder [F17.200] 03/05/2015   Total Time spent with patient:  25 minutes     Past Medical History:  Past Medical History  Diagnosis Date  . Polysubstance abuse     opiates, benzos, marijuana   History reviewed. No pertinent past surgical history. Family History:  Family History  Problem Relation Age of Onset  . Mental illness Neg Hx   . Drug abuse Other     Social History:  History  Alcohol Use  . 3.0 oz/week  . 5 Cans of beer per week     History  Drug Use  . Yes  .  Special: Marijuana, Hydrocodone, Oxycodone, Benzodiazepines    Comment: pain pills, opiates, benzos    Social History   Social History  . Marital Status: Single    Spouse Name: N/A  . Number of Children: N/A  . Years of Education: N/A   Social History Main Topics  . Smoking status: Current Every Day Smoker -- 2.00 packs/day    Types: Cigarettes  . Smokeless tobacco: None  . Alcohol Use: 3.0 oz/week    5 Cans of beer per week  . Drug Use: Yes    Special: Marijuana, Hydrocodone, Oxycodone, Benzodiazepines     Comment: pain pills, opiates, benzos  . Sexual Activity: Not Currently    Birth Control/ Protection: None   Other Topics Concern  . None   Social History Narrative   Additional Social History:    Pain Medications: Oxycodone & hydrocodone Prescriptions: See [place] d/c med list-Non-compliance. Pt states "it ran out", pt did not get strattera filled due to price Over the Counter: None Reported History of alcohol / drug use?: Yes Longest period of sobriety (when/how long): 10 days Negative Consequences of Use: Work / Youth worker, Charity fundraiser relationships Withdrawal Symptoms: Irritability   Name of Substance 2: Oxycodone & Hydrocodone 2 - Age of First Use: 25 years of age 66 - Amount (size/oz): "usually a couple or Production manager" 2 - Frequency: "most of the  time every day" 2 - Duration: Ongoing 2 - Last Use / Amount: 3.31.17, 3 58m Oxycodone pills  Sleep: Fair  Appetite:   Improving   Current Medications: Current Facility-Administered Medications  Medication Dose Route Frequency Provider Last Rate Last Dose  . acetaminophen (TYLENOL) tablet 650 mg  650 mg Oral Q6H PRN LNiel Hummer NP   650 mg at 05/08/15 0647  . alum & mag hydroxide-simeth (MAALOX/MYLANTA) 200-200-20 MG/5ML suspension 30 mL  30 mL Oral Q4H PRN LNiel Hummer NP      . benztropine (COGENTIN) tablet 0.5 mg  0.5 mg Oral QHS SUrsula Alert MD   0.5 mg at 05/09/15 2048  . chlordiazePOXIDE (LIBRIUM) capsule 25 mg   25 mg Oral QID PRN SUrsula Alert MD   25 mg at 05/09/15 1958  . cloNIDine (CATAPRES) tablet 0.1 mg  0.1 mg Oral BH-qamhs Michael Deike, MD   0.1 mg at 05/10/15 0853   Followed by  . [START ON 05/11/2015] cloNIDine (CATAPRES) tablet 0.1 mg  0.1 mg Oral QAC breakfast Michael Vastine, MD      . dicyclomine (BENTYL) tablet 20 mg  20 mg Oral Q6H PRN Michael Winton, MD      . feeding supplement (ENSURE ENLIVE) (ENSURE ENLIVE) liquid 237 mL  237 mL Oral BID BM Michael Wolin, MD   237 mL at 05/10/15 0949  . FLUoxetine (PROZAC) capsule 40 mg  40 mg Oral Daily SUrsula Alert MD   40 mg at 05/10/15 0853  . hydrOXYzine (ATARAX/VISTARIL) tablet 25 mg  25 mg Oral Q6H PRN SUrsula Alert MD   25 mg at 05/08/15 1949  . loperamide (IMODIUM) capsule 2-4 mg  2-4 mg Oral PRN SUrsula Alert MD      . magnesium hydroxide (MILK OF MAGNESIA) suspension 30 mL  30 mL Oral Daily PRN LNiel Hummer NP      . methocarbamol (ROBAXIN) tablet 500 mg  500 mg Oral Q8H PRN Michael Gosline, MD      . naproxen (NAPROSYN) tablet 500 mg  500 mg Oral BID PRN SUrsula Alert MD      . nicotine (NICODERM CQ - dosed in mg/24 hours) patch 21 mg  21 mg Transdermal Daily SUrsula Alert MD   21 mg at 05/10/15 0853  . OLANZapine (ZYPREXA) tablet 25 mg  25 mg Oral QHS SUrsula Alert MD   25 mg at 05/09/15 2048  . ondansetron (ZOFRAN-ODT) disintegrating tablet 4 mg  4 mg Oral Q6H PRN SUrsula Alert MD      . traZODone (DESYREL) tablet 50 mg  50 mg Oral QHS PRN LNiel Hummer NP   50 mg at 05/09/15 2048    Lab Results:  No results found for this or any previous visit (from the past 48 hour(s)).  Blood Alcohol level:  Lab Results  Component Value Date   ETH <5 05/04/2015   ETH 67* 04/26/2015    Physical Findings: AIMS: Facial and Oral Movements Muscles of Facial Expression: None, normal Lips and Perioral Area: None, normal Jaw: None, normal Tongue: None, normal,Extremity Movements Upper (arms, wrists, hands, fingers): None,  normal Lower (legs, knees, ankles, toes): None, normal, Trunk Movements Neck, shoulders, hips: None, normal, Overall Severity Severity of abnormal movements (highest score from questions above): None, normal Incapacitation due to abnormal movements: None, normal Patient's awareness of abnormal movements (rate only patient's report): No Awareness, Dental Status Current problems with teeth and/or dentures?: No Does patient usually wear dentures?: No  CIWA:  CIWA-Ar  Total: 0 COWS:  COWS Total Score: 0  Musculoskeletal: Strength & Muscle Tone: within normal limits Gait & Station: normal Patient leans: N/A  Psychiatric Specialty Exam: Review of Systems  Psychiatric/Behavioral: Positive for depression, hallucinations and substance abuse. The patient is nervous/anxious.   All other systems reviewed and are negative.  denies headache, denies chest pain, denies shortness of breath, no vomiting  Blood pressure 122/51, pulse 91, temperature 98.2 F (36.8 C), temperature source Oral, resp. rate 16, height 5' 6"  (1.676 m), weight 70.308 kg (155 lb).Body mass index is 25.03 kg/(m^2).  General Appearance:casual  Eye Contact::  Improved   Speech:  Normal Rate  Volume:  Normal  Mood: depressed, anxious  Affect: anxious  Thought Process:  generally linear  Orientation:  Full (Time, Place, and Person)  Thought Content:   (+) auditory hallucinations and VH of red lights - able to cope   Suicidal Thoughts:  No at this time denies any suicidal or self injurious ideations   Homicidal Thoughts:  No  Memory:  recent and remote grossly intact , immediate - fair  Judgement:  Fair but is improving   Insight:   Limited   Psychomotor Activity:  Less restless , no agitation at this time   Concentration:  Fair  Recall:  Good  Fund of Knowledge:Good  Language: Good  Akathisia:  Negative  Handed:  Right  AIMS (if indicated):     Assets:  Desire for Improvement Resilience  ADL's:  Fair   Cognition: WNL   Sleep:  Number of Hours: 6.25    Assessment- Michael Costa is a 25 y.o. Caucasian male, who is single , unemployed who lives with his mother in [place], [place] , who has a hx of depression as well as substance abuse , who presented to [employer] , IVCed by his mother for hallucinations as well as being aggressive at home.Pt continues to be depressed, anxious and has AH , but is able to cope better .Marland KitchenContinues to be motivated to go to a substance abuse program.     Treatment Plan Summary: Daily contact with patient to assess and evaluate symptoms and progress in treatment, Medication management, Plan inpatient treatment  and medications as below    Increased Prozac to 40 mg po daily for affective sx. Will continue  Zyprexa  25 mg po qhs for psychosis/mood sx. Will continue Cogentin 0.5 mg po qhs for EPS. Will continue CIWA/LIBRIUM prn for BZD withdrawal sx. Will continue COWS/clonidine protocol for opioid abuse. Pt is motivated to go to a substance abuse treatment program.CSW will continue working on disposition Will continue to monitor vitals ,medication compliance and treatment side effects while patient is here.  Will monitor for medical issues as well as call consult as needed.  Reviewed labs EKG for qtc- wnl , PL- 46.1 - will need to monitor on an out patient basis. Patient to participate in therapeutic milieu .  CSW will continue to work on disposition. Pt is motivated to go to a substance abuse program.       Michael Carmer, MD 05/10/2015, 11:36 AM

## 2015-05-10 NOTE — Progress Notes (Signed)
D: Pt presents anxious in affect and mood. Pt endorses AH. Pt denies any active withdrawal symptoms beyond anxiety. Pt is visible and active within the milieu. Pt denies any SI/HI/VH. Pt is compliant with his current POC. A: Writer administered scheduled and prn medications to pt, per MD orders. Continued support and availability as needed was extended to this pt. Staff continues to monitor pt with q6615min checks.  R: No adverse drug reactions noted. Pt receptive to treatment. Pt remains safe at this time.

## 2015-05-11 NOTE — Progress Notes (Signed)
Patient ID: Michael ClayJustin Fredenburg, male   DOB: [birth year], 25 y.o.   MRN: [id number]   D: Pt has been very flat and depressed on the unit today. Pt reported that he was having lots of withdrawals to include headaches, body aches, and anxiety. Pt did require some prn medication, and was able to get relief. Pt reported that his depression was a 4, his hopelessness was a 4, and his anxiety was a 6. Pt reported that his goal for today was to stop his mind from racing. Pt reported being negative SI/HI, no AH/VH noted. A: 15 min checks continued for patient safety. R: Pt safety maintained.

## 2015-05-11 NOTE — Progress Notes (Signed)
Patient ID: Michael Costa, male   DOB: 1990/07/18, 25 y.o.   MRN: [id number] [facility] MD Progress Note  05/11/2015 1:49 PM Michael Costa  MRN:  [id number] Subjective:  Patient states " I am OK. I still have voices and see the light , but I am able to better cope .'  Objective : Michael Costa is a 25 y.o. Caucasian male, who is single , unemployed who lives with his mother in [place], [place] , who has a hx of depression as well as substance abuse , who presented to [place] , IVCed by his mother for hallucinations as well as being aggressive at home.  I have discussed case with treatment team and have met with patient . Pt today continues to be depressed, anxious , continues to have AH, but relates that he is able to cope with it.He has been isolating to his room most of the day, and observed lying in bed sleep. Pt also reports on and off anxiety sx. He is tolerating his medications at this time, he states that he is just fatigue but better than he was. Per staff pt continues to be depressed, mostly withdrawn , will continue to need encouragement. He has not been attending groups today, as stated he is in his bed resting. He hopes to have a better day tomorrow. He cites sleeping well and eating well " they bring me my food here to my room."     Principal Problem: MDD (major depressive disorder), recurrent, severe, with psychosis ([place]) Diagnosis:   Patient Active Problem List   Diagnosis Date Noted  . MDD (major depressive disorder), recurrent, severe, with psychosis ([place]) [F33.3] 04/07/2015  . Opioid use disorder, moderate, dependence ([place]) [F11.20] 03/05/2015  . Alcohol use disorder, mild, abuse [F10.10] 03/05/2015  . 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 Tobacco use disorder [F17.200] 03/05/2015   Total Time spent with patient:  25 minutes   Past Medical History:  Past Medical History  Diagnosis Date  . Polysubstance abuse     opiates, benzos, marijuana   History reviewed. No pertinent  past surgical history. Family History:  Family History  Problem Relation Age of Onset  . Mental illness Neg Hx   . Drug abuse Other     Social History:  History  Alcohol Use  . 3.0 oz/week  . 5 Cans of beer per week     History  Drug Use  . Yes  . Special: Marijuana, Hydrocodone, Oxycodone, Benzodiazepines    Comment: pain pills, opiates, benzos    Social History   Social History  . Marital Status: Single    Spouse Name: N/A  . Number of Children: N/A  . Years of Education: N/A   Social History Main Topics  . Smoking status: Current Every Day Smoker -- 2.00 packs/day    Types: Cigarettes  . Smokeless tobacco: None  . Alcohol Use: 3.0 oz/week    5 Cans of beer per week  . Drug Use: Yes    Special: Marijuana, Hydrocodone, Oxycodone, Benzodiazepines     Comment: pain pills, opiates, benzos  . Sexual Activity: Not Currently    Birth Control/ Protection: None   Other Topics Concern  . None   Social History Narrative   Additional Social History:    Pain Medications: Oxycodone & hydrocodone Prescriptions: See [place] d/c med list-Non-compliance. Pt states "it ran out", pt did not get strattera filled due to price Over the Counter: None Reported History of alcohol / drug use?: Yes  Longest period of sobriety (when/how long): 10 days Negative Consequences of Use: Work / Youth worker, Charity fundraiser relationships Withdrawal Symptoms: Irritability   Name of Substance 2: Oxycodone & Hydrocodone 2 - Age of First Use: 25 years of age 77 - Amount (size/oz): "usually a couple or Production manager" 2 - Frequency: "most of the time every day" 2 - Duration: Ongoing 2 - Last Use / Amount: 3.31.17, 3 71m Oxycodone pills  Sleep: Fair  Appetite:   Improving   Current Medications: Current Facility-Administered Medications  Medication Dose Route Frequency Provider Last Rate Last Dose  . acetaminophen (TYLENOL) tablet 650 mg  650 mg Oral Q6H PRN LNiel Hummer NP   650 mg at 05/11/15 1131  . alum &  mag hydroxide-simeth (MAALOX/MYLANTA) 200-200-20 MG/5ML suspension 30 mL  30 mL Oral Q4H PRN LNiel Hummer NP   30 mL at 05/10/15 1616  . benztropine (COGENTIN) tablet 0.5 mg  0.5 mg Oral QHS SUrsula Alert MD   0.5 mg at 05/10/15 2113  . chlordiazePOXIDE (LIBRIUM) capsule 25 mg  25 mg Oral QID PRN SUrsula Alert MD   25 mg at 05/09/15 1958  . cloNIDine (CATAPRES) tablet 0.1 mg  0.1 mg Oral QAC breakfast SUrsula Alert MD   0.1 mg at 05/11/15 0809  . dicyclomine (BENTYL) tablet 20 mg  20 mg Oral Q6H PRN Saramma Eappen, MD      . feeding supplement (ENSURE ENLIVE) (ENSURE ENLIVE) liquid 237 mL  237 mL Oral BID BM Saramma Eappen, MD   237 mL at 05/11/15 0810  . FLUoxetine (PROZAC) capsule 40 mg  40 mg Oral Daily SUrsula Alert MD   40 mg at 05/11/15 0809  . hydrOXYzine (ATARAX/VISTARIL) tablet 25 mg  25 mg Oral Q6H PRN SUrsula Alert MD   25 mg at 05/10/15 1958  . loperamide (IMODIUM) capsule 2-4 mg  2-4 mg Oral PRN SUrsula Alert MD      . magnesium hydroxide (MILK OF MAGNESIA) suspension 30 mL  30 mL Oral Daily PRN LNiel Hummer NP      . methocarbamol (ROBAXIN) tablet 500 mg  500 mg Oral Q8H PRN Saramma Eappen, MD      . naproxen (NAPROSYN) tablet 500 mg  500 mg Oral BID PRN SUrsula Alert MD      . nicotine (NICODERM CQ - dosed in mg/24 hours) patch 21 mg  21 mg Transdermal Daily SUrsula Alert MD   21 mg at 05/11/15 0809  . OLANZapine (ZYPREXA) tablet 25 mg  25 mg Oral QHS SUrsula Alert MD   25 mg at 05/10/15 2113  . ondansetron (ZOFRAN-ODT) disintegrating tablet 4 mg  4 mg Oral Q6H PRN SUrsula Alert MD      . traZODone (DESYREL) tablet 50 mg  50 mg Oral QHS PRN LNiel Hummer NP   50 mg at 05/10/15 2113    Lab Results:  No results found for this or any previous visit (from the past 48 hour(s)).  Blood Alcohol level:  Lab Results  Component Value Date   ETH <5 05/04/2015   ETH 67* 04/26/2015    Physical Findings: AIMS: Facial and Oral Movements Muscles of Facial  Expression: None, normal Lips and Perioral Area: None, normal Jaw: None, normal Tongue: None, normal,Extremity Movements Upper (arms, wrists, hands, fingers): None, normal Lower (legs, knees, ankles, toes): None, normal, Trunk Movements Neck, shoulders, hips: None, normal, Overall Severity Severity of abnormal movements (highest score from questions above): None, normal Incapacitation due to abnormal movements: None,  normal Patient's awareness of abnormal movements (rate only patient's report): No Awareness, Dental Status Current problems with teeth and/or dentures?: No Does patient usually wear dentures?: No  CIWA:  CIWA-Ar Total: 0 COWS:  COWS Total Score: 0  Musculoskeletal: Strength & Muscle Tone: within normal limits Gait & Station: normal Patient leans: N/A  Psychiatric Specialty Exam: Review of Systems  Psychiatric/Behavioral: Positive for depression, hallucinations and substance abuse. The patient is nervous/anxious.   All other systems reviewed and are negative.  denies headache, denies chest pain, denies shortness of breath, no vomiting  Blood pressure 123/65, pulse 79, temperature 98.1 F (36.7 C), temperature source Oral, resp. rate 16, height 5' 6" (1.676 m), weight 70.308 kg (155 lb).Body mass index is 25.03 kg/(m^2).  General Appearance:casual  Eye Contact::  Improved   Speech:  Normal Rate  Volume:  Normal  Mood: depressed, anxious  Affect: anxious  Thought Process:  generally linear  Orientation:  Full (Time, Place, and Person)  Thought Content:   (+) auditory hallucinations and VH of red lights - able to cope   Suicidal Thoughts:  No at this time denies any suicidal or self injurious ideations   Homicidal Thoughts:  No  Memory:  recent and remote grossly intact , immediate - fair  Judgement:  Fair but is improving   Insight:   Limited   Psychomotor Activity:  Less restless , no agitation at this time   Concentration:  Fair  Recall:  Good  Fund of  Knowledge:Good  Language: Good  Akathisia:  Negative  Handed:  Right  AIMS (if indicated):     Assets:  Desire for Improvement Resilience  ADL's:  Fair   Cognition: WNL  Sleep:  Number of Hours: 4.5    Assessment- Lauren Aguayo is a 25 y.o. Caucasian male, who is single , unemployed who lives with his mother in [place], [place] , who has a hx of depression as well as substance abuse , who presented to [facility] , IVCed by his mother for hallucinations as well as being aggressive at home.Pt continues to be depressed, anxious and has AH , but is able to cope better .Marland KitchenContinues to be motivated to go to a substance abuse program.     Treatment Plan Summary: Daily contact with patient to assess and evaluate symptoms and progress in treatment, Medication management, Plan inpatient treatment  and medications as below    Increased Prozac to 40 mg po daily for affective sx. Will continue  Zyprexa  25 mg po qhs for psychosis/mood sx. Will continue Cogentin 0.5 mg po qhs for EPS. Will continue CIWA/LIBRIUM prn for BZD withdrawal sx. Will continue COWS/clonidine protocol for opioid abuse. Pt is motivated to go to a substance abuse treatment program.CSW will continue working on disposition Will continue to monitor vitals ,medication compliance and treatment side effects while patient is here.  Will monitor for medical issues as well as call consult as needed.  Reviewed labs EKG for qtc- wnl , PL- 46.1 - will need to monitor on an out patient basis. Patient to participate in therapeutic milieu .  CSW will continue to work on disposition. Pt is motivated to go to a substance abuse program.    Nanci Pina, FNP 05/11/2015, 1:49 PM I agree with assessment and plan Woodroe Chen. Sabra Heck, M.D.

## 2015-05-11 NOTE — [facility] Group Notes (Signed)
[facility] Group Notes:  (Clinical Social Work)  05/11/2015  11:15-12:00PM  Summary of Progress/Problems:   Today's process group involved patients discussing one thing they do that keeps them from living the life they want.  This was initially a difficult topic for most patients to understand and was both written on the whiteboard and explained in several ways until understood. The patient expressed that he sleeps too much, in fact as much as possible, and that keeps him from doing other things in life.  He appeared agitated when other patients were speaking and was up and down several times, finally leaving the room and not returning.  Type of Therapy:  Group Therapy - Process  Participation Level:  Minimal  Participation Quality:  Attentive  Affect:  Flat  Cognitive:  Confused  Insight:  Limited  Engagement in Therapy:  Limited  Modes of Intervention:  Exploration, Discussion  Ambrose MantleMareida Grossman-Orr, LCSW 05/11/2015, 12:41 PM

## 2015-05-11 NOTE — Progress Notes (Signed)
D: Pt endorses severe anxiety; "I have been feeling very anxious all day; I think I could be withdrawing" Pt also endorses auditory hallucinations-denies command auditory hallucinations." Pt states, "The voices are saying words like F/U, I think they can read my mind and say thing I was going to say." Pt denies pain, SI, HI or VH. A: Medications offered as prescribed.  Support, encouragement, and safe environment provided.  15-minute safety checks continue. R: Pt was med compliant.  Pt attended wrap-up group. Safety checks continue

## 2015-05-11 NOTE — Progress Notes (Signed)
Adult Psychoeducational Group Note  Date:  05/11/2015 Time:  9:12 PM  Group Topic/Focus:  Wrap-Up Group:   The focus of this group is to help patients review their daily goal of treatment and discuss progress on daily workbooks.  Participation Level:  Did Not Attend   Cruz CondonMichael O Skylen Danielsen 05/11/2015, 9:12 PM

## 2015-05-12 MED ORDER — HYDROXYZINE HCL 50 MG PO TABS
50.0000 mg | ORAL_TABLET | Freq: Three times a day (TID) | ORAL | Status: DC | PRN
  Administered 2015-05-12 – 2015-05-14 (×5): 50 mg via ORAL
  Filled 2015-05-12: qty 10
  Filled 2015-05-12 (×4): qty 1

## 2015-05-12 MED ORDER — CLONIDINE HCL 0.1 MG PO TABS
0.1000 mg | ORAL_TABLET | Freq: Two times a day (BID) | ORAL | Status: AC
  Administered 2015-05-12 – 2015-05-13 (×2): 0.1 mg via ORAL
  Filled 2015-05-12 (×3): qty 1

## 2015-05-12 NOTE — Progress Notes (Signed)
Patient ID: Michael ClayJustin Jacober, male   DOB: Sep 05, 1990, 25 y.o.   MRN: [id number]    D: Pt has been very flat and depressed on the unit today. Pt reported that he was having lots of withdrawals to include headaches, body aches, and anxiety still. Fredna Dowakia NP made aware new orders noted for Clonidine and Vistaril 50mg . Pt did require some prn medication, and was able to get relief. Pt reported that he really wanted some Ativan like [facility]. When patient was told that he could not have Ativan, he requested to be discharged. When patient was told that he was not going to be discharged, per Fredna Dowakia NP, she reported that he wanted to see the doctor because he was going to make the doctor let him go home. Pt reported that his depression was a 4, his hopelessness was a 4, and his anxiety was a 4. Pt reported that his goal for today was to go to groups. Pt reported being negative SI/HI, no AH/VH noted. A: 15 min checks continued for patient safety. R: Pt safety maintained.

## 2015-05-12 NOTE — Progress Notes (Signed)
Patient ID: Michael Costa, male   DOB: 08/29/1990, 25 y.o.   MRN: [id number] [facility] MD Progress Note  05/12/2015 12:09 PM Michael Costa  MRN:  [id number] Subjective:  Patient states " I am much better, just drowsy. My day was all right. I am still seeing flashing lights. Im ignoring it though.   Objective : Michael Costa is a 25 y.o. Caucasian male, who is single , unemployed who lives with his mother in [place], [place] , who has a hx of depression as well as substance abuse , who presented to [facility] , IVCed by his mother for hallucinations as well as being aggressive at home.  I have discussed case with treatment team and have met with patient . Pt today shows much improvement since yesterday. He continues to have VH, but relates that he is able to cope with it. He is observed ambulating on the unit. Yesterday he slept most of the day. He did attend the morning social worker group. He states is goal today is to work on coping skills to help to ignore the lights and voices. Pt also reports on and off anxiety sx. He is tolerating his medications at this time, he states that he is just fatigue but better than he was. Per staff pt continues to be depressed, mostly withdrawn, will continue to need encouragement. H He cites sleeping well and eating well. He continues to ask for Adderall stating that "I told her I need it. She think Im strung out on drugs. I dont abuse drugs, I think Im ADHD like that. " they bring me my food here to my room."     Principal Problem: MDD (major depressive disorder), recurrent, severe, with psychosis ([place]) Diagnosis:   Patient Active Problem List   Diagnosis Date Noted  . MDD (major depressive disorder), recurrent, severe, with psychosis ([place]) [F33.3] 04/07/2015  . Opioid use disorder, moderate, dependence ([place]) [F11.20] 03/05/2015  . Alcohol use disorder, mild, abuse [F10.10] 03/05/2015  . 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 Tobacco use disorder [F17.200]  03/05/2015   Total Time spent with patient:  25 minutes   Past Medical History:  Past Medical History  Diagnosis Date  . Polysubstance abuse     opiates, benzos, marijuana   History reviewed. No pertinent past surgical history. Family History:  Family History  Problem Relation Age of Onset  . Mental illness Neg Hx   . Drug abuse Other     Social History:  History  Alcohol Use  . 3.0 oz/week  . 5 Cans of beer per week     History  Drug Use  . Yes  . Special: Marijuana, Hydrocodone, Oxycodone, Benzodiazepines    Comment: pain pills, opiates, benzos    Social History   Social History  . Marital Status: Single    Spouse Name: N/A  . Number of Children: N/A  . Years of Education: N/A   Social History Main Topics  . Smoking status: Current Every Day Smoker -- 2.00 packs/day    Types: Cigarettes  . Smokeless tobacco: None  . Alcohol Use: 3.0 oz/week    5 Cans of beer per week  . Drug Use: Yes    Special: Marijuana, Hydrocodone, Oxycodone, Benzodiazepines     Comment: pain pills, opiates, benzos  . Sexual Activity: Not Currently    Birth Control/ Protection: None   Other Topics Concern  . None   Social History Narrative   Additional Social History:    Pain Medications:  Oxycodone & hydrocodone Prescriptions: See [facility] d/c med list-Non-compliance. Pt states "it ran out", pt did not get strattera filled due to price Over the Counter: None Reported History of alcohol / drug use?: Yes Longest period of sobriety (when/how long): 10 days Negative Consequences of Use: Work / Youth worker, Charity fundraiser relationships Withdrawal Symptoms: Irritability   Name of Substance 2: Oxycodone & Hydrocodone 2 - Age of First Use: 25 years of age 17 - Amount (size/oz): "usually a couple or Production manager" 2 - Frequency: "most of the time every day" 2 - Duration: Ongoing 2 - Last Use / Amount: 3.31.17, 3 44m Oxycodone pills  Sleep: Fair  Appetite:   Improving   Current Medications: Current  Facility-Administered Medications  Medication Dose Route Frequency Provider Last Rate Last Dose  . acetaminophen (TYLENOL) tablet 650 mg  650 mg Oral Q6H PRN LNiel Hummer NP   650 mg at 05/11/15 1131  . alum & mag hydroxide-simeth (MAALOX/MYLANTA) 200-200-20 MG/5ML suspension 30 mL  30 mL Oral Q4H PRN LNiel Hummer NP   30 mL at 05/10/15 1616  . benztropine (COGENTIN) tablet 0.5 mg  0.5 mg Oral QHS SUrsula Alert MD   0.5 mg at 05/11/15 2144  . chlordiazePOXIDE (LIBRIUM) capsule 25 mg  25 mg Oral QID PRN SUrsula Alert MD   25 mg at 05/12/15 1206  . feeding supplement (ENSURE ENLIVE) (ENSURE ENLIVE) liquid 237 mL  237 mL Oral BID BM Saramma Eappen, MD   237 mL at 05/12/15 0725  . FLUoxetine (PROZAC) capsule 40 mg  40 mg Oral Daily SUrsula Alert MD   40 mg at 05/12/15 0724  . magnesium hydroxide (MILK OF MAGNESIA) suspension 30 mL  30 mL Oral Daily PRN LNiel Hummer NP      . nicotine (NICODERM CQ - dosed in mg/24 hours) patch 21 mg  21 mg Transdermal Daily SUrsula Alert MD   21 mg at 05/12/15 0724  . OLANZapine (ZYPREXA) tablet 25 mg  25 mg Oral QHS SUrsula Alert MD   25 mg at 05/11/15 2144  . traZODone (DESYREL) tablet 50 mg  50 mg Oral QHS PRN LNiel Hummer NP   50 mg at 05/11/15 2144    Lab Results:  No results found for this or any previous visit (from the past 48 hour(s)).  Blood Alcohol level:  Lab Results  Component Value Date   ETH <5 05/04/2015   ETH 67* 04/26/2015    Physical Findings: AIMS: Facial and Oral Movements Muscles of Facial Expression: None, normal Lips and Perioral Area: None, normal Jaw: None, normal Tongue: None, normal,Extremity Movements Upper (arms, wrists, hands, fingers): None, normal Lower (legs, knees, ankles, toes): None, normal, Trunk Movements Neck, shoulders, hips: None, normal, Overall Severity Severity of abnormal movements (highest score from questions above): None, normal Incapacitation due to abnormal movements: None,  normal Patient's awareness of abnormal movements (rate only patient's report): No Awareness, Dental Status Current problems with teeth and/or dentures?: No Does patient usually wear dentures?: No  CIWA:  CIWA-Ar Total: 0 COWS:  COWS Total Score: 3  Musculoskeletal: Strength & Muscle Tone: within normal limits Gait & Station: normal Patient leans: N/A  Psychiatric Specialty Exam: Review of Systems  Psychiatric/Behavioral: Positive for depression, hallucinations and substance abuse. Negative for suicidal ideas and memory loss. The patient is nervous/anxious. The patient does not have insomnia.   All other systems reviewed and are negative.  denies headache, denies chest pain, denies shortness of breath, no vomiting  Blood  pressure 123/72, pulse 76, temperature 97.5 F (36.4 C), temperature source Oral, resp. rate 18, height 5' 6"  (1.676 m), weight 70.308 kg (155 lb).Body mass index is 25.03 kg/(m^2).  General Appearance:casual  Eye Contact::  Improved   Speech:  Normal Rate  Volume:  Normal  Mood: depressed, anxious  Affect: anxious  Thought Process:  generally linear  Orientation:  Full (Time, Place, and Person)  Thought Content:   (+) VH of red lights - able to cope   Suicidal Thoughts:  No at this time denies any suicidal or self injurious ideations   Homicidal Thoughts:  No  Memory:  recent and remote grossly intact , immediate - fair  Judgement:  Fair but is improving   Insight:   Limited   Psychomotor Activity:  Less restless , no agitation at this time   Concentration:  Fair  Recall:  Good  Fund of Knowledge:Good  Language: Good  Akathisia:  Negative  Handed:  Right  AIMS (if indicated):     Assets:  Desire for Improvement Resilience  ADL's:  Fair   Cognition: WNL  Sleep:  Number of Hours: 6    Assessment- Michael Costa is a 25 y.o. Caucasian male, who is single , unemployed who lives with his mother in [place], [place] , who has a hx of depression as well as substance  abuse , who presented to [facility] , IVCed by his mother for hallucinations as well as being aggressive at home.Pt continues to be depressed, anxious and has AH , but is able to cope better .Marland KitchenContinues to be motivated to go to a substance abuse program.     Treatment Plan Summary: Daily contact with patient to assess and evaluate symptoms and progress in treatment, Medication management, Plan inpatient treatment  and medications as below    Increased Prozac to 40 mg po daily for affective sx. Will continue  Zyprexa  25 mg po qhs for psychosis/mood sx. Will continue Cogentin 0.5 mg po qhs for EPS. Will continue CIWA/LIBRIUM prn for BZD withdrawal sx. Will continue COWS/clonidine protocol for opioid abuse. Pt is motivated to go to a substance abuse treatment program.CSW will continue working on disposition Will continue to monitor vitals ,medication compliance and treatment side effects while patient is here.  Will monitor for medical issues as well as call consult as needed.  Reviewed labs EKG for qtc- wnl , PL- 46.1 - will need to monitor on an out patient basis. Patient to participate in therapeutic milieu .  CSW will continue to work on disposition. Pt is motivated to go to a substance abuse program.    Nanci Pina, FNP 05/12/2015, 12:09 PM I agree with assessment and plan Woodroe Chen. Sabra Heck, M.D.

## 2015-05-12 NOTE — Progress Notes (Signed)
Pt did not attend wrap up group meeting this evening.  

## 2015-05-12 NOTE — [facility] Group Notes (Signed)
[facility] Group Notes:  (Clinical Social Work)  05/12/2015  11:00AM-12:00PM  Summary of Progress/Problems:  The main focus of today's process group was to listen to a variety of genres of music and to identify that different types of music provoke different responses.  The patient then was able to identify personally what was soothing for them, as well as energizing, as well as how patient can personally use this knowledge in sleep habits, with depression, and with other symptoms.  The patient came in and out of group several times throughout the hour, and stated he enjoyed the music each time when he was present.  It was not clear and he did not state the cause of his restlessness.  Type of Therapy:  Music Therapy   Participation Level:  Active  Participation Quality:  Attentive and Sharing  Affect:  Blunted  Cognitive:  Oriented  Insight: Improving  Engagement in Therapy:  Engaged  Modes of Intervention:   Activity, Exploration  Ambrose MantleMareida Grossman-Orr, LCSW 05/12/2015

## 2015-05-12 NOTE — Progress Notes (Signed)
D: Pt has been isolative and withdrawn to room for most of the evening (he is usually very active). Pt endorses moderate anxiety and moderate depression; "I have not been myself of late." Pt however, denies pain, SI, HI or AVH. Pt remained calm and cooperative. A: Medications offered as prescribed.  Support, encouragement, and safe environment provided.  15-minute safety checks continue. R: Pt was med compliant.  Pt did not attend wrap-up group. Safety checks continue

## 2015-05-13 MED ORDER — FLUOXETINE HCL 20 MG PO CAPS
60.0000 mg | ORAL_CAPSULE | Freq: Every day | ORAL | Status: DC
Start: 2015-05-14 — End: 2015-05-15
  Administered 2015-05-14 – 2015-05-15 (×2): 60 mg via ORAL
  Filled 2015-05-13: qty 21
  Filled 2015-05-13 (×3): qty 3

## 2015-05-13 MED ORDER — LORAZEPAM 0.5 MG PO TABS
0.5000 mg | ORAL_TABLET | Freq: Four times a day (QID) | ORAL | Status: DC | PRN
  Administered 2015-05-13 – 2015-05-14 (×3): 0.5 mg via ORAL
  Filled 2015-05-13 (×3): qty 1

## 2015-05-13 NOTE — Progress Notes (Signed)
Patient ID: Michael Costa, male   DOB: 1990-06-05, 25 y.o.   MRN: [id number] [facility] MD Progress Note  05/13/2015 1:26 PM Carlyle Mcelrath  MRN:  [id number] Subjective:  Patient states " I am very anxious , can I have some ativan , Librium is not helping."      Objective : Michael Costa is a 25 y.o. Caucasian male, who is single , unemployed who lives with his mother in [place], [place] , who has a hx of depression as well as substance abuse , who presented to [facility] , IVCed by his mother for hallucinations as well as being aggressive at home.  I have discussed case with treatment team and have met with patient . Pt today continues to be anxious and is seen as restless , requesting ativan. Pt continues to be interested in going to [facility] for his substance abuse issues. Pt reports AH/VH as improving . Pt has been tolerating his medications well. Denies ADRs. Per staff pt continues to be anxious  will continue to need encouragement.    Principal Problem: MDD (major depressive disorder), recurrent, severe, with psychosis ([place]) Diagnosis:   Patient Active Problem List   Diagnosis Date Noted  . MDD (major depressive disorder), recurrent, severe, with psychosis ([place]) [F33.3] 04/07/2015  . Opioid use disorder, moderate, dependence ([place]) [F11.20] 03/05/2015  . Alcohol use disorder, mild, abuse [F10.10] 03/05/2015  . 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 Tobacco use disorder [F17.200] 03/05/2015   Total Time spent with patient:  25 minutes     Past Medical History:  Past Medical History  Diagnosis Date  . Polysubstance abuse     opiates, benzos, marijuana   History reviewed. No pertinent past surgical history. Family History:  Family History  Problem Relation Age of Onset  . Mental illness Neg Hx   . Drug abuse Other     Social History:  History  Alcohol Use  . 3.0 oz/week  . 5 Cans of beer per week     History  Drug Use  . Yes  . Special: Marijuana, Hydrocodone,  Oxycodone, Benzodiazepines    Comment: pain pills, opiates, benzos    Social History   Social History  . Marital Status: Single    Spouse Name: N/A  . Number of Children: N/A  . Years of Education: N/A   Social History Main Topics  . Smoking status: Current Every Day Smoker -- 2.00 packs/day    Types: Cigarettes  . Smokeless tobacco: None  . Alcohol Use: 3.0 oz/week    5 Cans of beer per week  . Drug Use: Yes    Special: Marijuana, Hydrocodone, Oxycodone, Benzodiazepines     Comment: pain pills, opiates, benzos  . Sexual Activity: Not Currently    Birth Control/ Protection: None   Other Topics Concern  . None   Social History Narrative   Additional Social History:    Pain Medications: Oxycodone & hydrocodone Prescriptions: See [place] d/c med list-Non-compliance. Pt states "it ran out", pt did not get strattera filled due to price Over the Counter: None Reported History of alcohol / drug use?: Yes Longest period of sobriety (when/how long): 10 days Negative Consequences of Use: Work / Youth worker, Charity fundraiser relationships Withdrawal Symptoms: Irritability   Name of Substance 2: Oxycodone & Hydrocodone 2 - Age of First Use: 25 years of age 41 - Amount (size/oz): "usually a couple or Production manager" 2 - Frequency: "most of the time every day" 2 - Duration: Ongoing 2 - Last Use /  Amount: 3.31.17, 3 39m Oxycodone pills  Sleep: Fair  Appetite:   Improving   Current Medications: Current Facility-Administered Medications  Medication Dose Route Frequency Provider Last Rate Last Dose  . acetaminophen (TYLENOL) tablet 650 mg  650 mg Oral Q6H PRN LNiel Hummer NP   650 mg at 05/12/15 1605  . alum & mag hydroxide-simeth (MAALOX/MYLANTA) 200-200-20 MG/5ML suspension 30 mL  30 mL Oral Q4H PRN LNiel Hummer NP   30 mL at 05/10/15 1616  . benztropine (COGENTIN) tablet 0.5 mg  0.5 mg Oral QHS SUrsula Alert MD   0.5 mg at 05/12/15 2119  . feeding supplement (ENSURE ENLIVE) (ENSURE ENLIVE)  liquid 237 mL  237 mL Oral BID BM Tamecka Milham, MD   237 mL at 05/13/15 0937  . [START ON 05/14/2015] FLUoxetine (PROZAC) capsule 60 mg  60 mg Oral Daily Nalini Alcaraz, MD      . hydrOXYzine (ATARAX/VISTARIL) tablet 50 mg  50 mg Oral TID PRN TNanci Pina FNP   50 mg at 05/13/15 0937  . LORazepam (ATIVAN) tablet 0.5 mg  0.5 mg Oral Q6H PRN SUrsula Alert MD   0.5 mg at 05/13/15 1202  . magnesium hydroxide (MILK OF MAGNESIA) suspension 30 mL  30 mL Oral Daily PRN LNiel Hummer NP      . nicotine (NICODERM CQ - dosed in mg/24 hours) patch 21 mg  21 mg Transdermal Daily SUrsula Alert MD   21 mg at 05/13/15 0846  . OLANZapine (ZYPREXA) tablet 25 mg  25 mg Oral QHS SUrsula Alert MD   25 mg at 05/12/15 2119  . traZODone (DESYREL) tablet 50 mg  50 mg Oral QHS PRN LNiel Hummer NP   50 mg at 05/12/15 2119    Lab Results:  No results found for this or any previous visit (from the past 48 hour(s)).  Blood Alcohol level:  Lab Results  Component Value Date   ETH <5 05/04/2015   ETH 67* 04/26/2015    Physical Findings: AIMS: Facial and Oral Movements Muscles of Facial Expression: None, normal Lips and Perioral Area: None, normal Jaw: None, normal Tongue: None, normal,Extremity Movements Upper (arms, wrists, hands, fingers): None, normal Lower (legs, knees, ankles, toes): None, normal, Trunk Movements Neck, shoulders, hips: None, normal, Overall Severity Severity of abnormal movements (highest score from questions above): None, normal Incapacitation due to abnormal movements: None, normal Patient's awareness of abnormal movements (rate only patient's report): No Awareness, Dental Status Current problems with teeth and/or dentures?: No Does patient usually wear dentures?: No  CIWA:  CIWA-Ar Total: 2 COWS:  COWS Total Score: 2  Musculoskeletal: Strength & Muscle Tone: within normal limits Gait & Station: normal Patient leans: N/A  Psychiatric Specialty Exam: Review of Systems   Psychiatric/Behavioral: Positive for depression, hallucinations and substance abuse. The patient is nervous/anxious.   All other systems reviewed and are negative.  denies headache, denies chest pain, denies shortness of breath, no vomiting  Blood pressure 128/70, pulse 100, temperature 97.9 F (36.6 C), temperature source Oral, resp. rate 18, height 5' 6"  (1.676 m), weight 70.308 kg (155 lb).Body mass index is 25.03 kg/(m^2).  General Appearance:casual  Eye Contact::  Improved   Speech:  Normal Rate  Volume:  Normal  Mood: depressed, anxious  Affect: anxious  Thought Process:  generally linear  Orientation:  Full (Time, Place, and Person)  Thought Content:   (+) auditory hallucinations and VH of red lights - able to cope   Suicidal Thoughts:  No at this time denies any suicidal or self injurious ideations   Homicidal Thoughts:  No  Memory:  recent and remote grossly intact , immediate - fair  Judgement:  Fair but is improving   Insight:   Limited   Psychomotor Activity:   restless , no agitation at this time   Concentration:  Fair  Recall:  Good  Fund of Knowledge:Good  Language: Good  Akathisia:  Negative  Handed:  Right  AIMS (if indicated):     Assets:  Desire for Improvement Resilience  ADL's:  Fair   Cognition: WNL  Sleep:  Number of Hours: 6    Assessment- Davian Hanshaw is a 25 y.o. Caucasian male, who is single , unemployed who lives with his mother in [place], [place] , who has a hx of depression as well as substance abuse , who presented to [place] , IVCed by his mother for hallucinations as well as being aggressive at home.Pt presents as very anxious today. Continues to be motivated to go to a substance abuse program.     Treatment Plan Summary: Daily contact with patient to assess and evaluate symptoms and progress in treatment, Medication management, Plan inpatient treatment  and medications as below    Will increase Prozac to 60 mg po daily for affective sx. Will  continue  Zyprexa  25 mg po qhs for psychosis/mood sx. Will continue Cogentin 0.5 mg po qhs for EPS. Will change to CIWA/ativan  prn for BZD withdrawal sx. Will continue COWS/clonidine protocol for opioid abuse. Pt is motivated to go to a substance abuse treatment program.CSW will continue working on disposition Will continue to monitor vitals ,medication compliance and treatment side effects while patient is here.  Will monitor for medical issues as well as call consult as needed.  Reviewed labs EKG for qtc- wnl , PL- 46.1 - will need to monitor on an out patient basis. Patient to participate in therapeutic milieu .  CSW will continue to work on disposition. Pt is motivated to go to a substance abuse program.       Ursula Alert, MD 05/13/2015, 1:26 PM

## 2015-05-13 NOTE — Progress Notes (Signed)
Adult Psychoeducational Group Note  Date:  05/13/2015 Time:  9:03 PM  Group Topic/Focus:  Wrap-Up Group:   The focus of this group is to help patients review their daily goal of treatment and discuss progress on daily workbooks.  Participation Level:  Active  Participation Quality:  Appropriate and Attentive  Affect:  Appropriate  Cognitive:  Appropriate  Insight: Appropriate  Engagement in Group:  Improving  Modes of Intervention:  Discussion  Additional Comments:  Pt rated his day an 8 out of 10. Pt goal for tomorrow is to get discharged.   Merlinda FrederickKeshia S Lynsee Wands 05/13/2015, 9:03 PM

## 2015-05-13 NOTE — Progress Notes (Signed)
D    Pt is intrusive at times and attention seeking     He responds well to redirection    Pt reports he hopes to be discharged tomorrow   He denies withdrawal symptoms   He is medications seeking at times  A    Verbal support given    Medications administered and effectiveness monitored   Q 15 min checks   R   Pt is safe at present time

## 2015-05-13 NOTE — Progress Notes (Signed)
Patient ID: Michael ClayJustin Olmeda, male   DOB: 08-05-1990, 25 y.o.   MRN: [id number] D: Pt isolative in room most of the evening. Mood and affect appeared depressed and anxious. Patient c/o anxiety but denies any other withdrawal symptoms. Denies SI/HI/AVH and pain.No behavioral issues noted.  A: Support and encouragement offered as needed. Medications administered as prescribed.  R: Patient cooperative and remains safe on unit. Will continue to monitor patient for safety and stability.

## 2015-05-13 NOTE — [facility] Group Notes (Signed)
[facility]   05/13/2015 11:32 AM  Participation Quality:  Engaged  Mood/Affect:  Flat  Depression Rating:    Anxiety Rating:    Thoughts of Suicide:  No Will you contract for safety?   NA  Current AVH:  No  Plan for Discharge/Comments:  States he is still interested in [facility], and is hoping to have the opportunity to go there.  [place] and [facility].  "I went there once before, but then did not follow up.  I will go this time."  Transportation Means:   Supports:  Daryel GeraldNorth, Mariene Dickerman B

## 2015-05-13 NOTE — Progress Notes (Signed)
DAR NOTE: Patient remained anxious and irritable.  Denies pain, auditory and visual hallucinations.  Rates depression at 4, hopelessness at 4, and anxiety at 4.  Reports withdrawal symptoms of cravings, agitation, runny nose and irritability on self inventory form.  Maintained on routine safety checks.  Medications given as prescribed.  Support and encouragement offered as needed.  Attended group and participated.   Patient observed socializing with peers in the dayroom.  Patient at the nursing station constantly asking for medication.  Patient reminded to use his coping skills learned during group therapy.  Ativan 0.5 mg and Vistaril 25 mg given for complain of anxiety and agitation with good effect.

## 2015-05-13 NOTE — Plan of Care (Signed)
Problem: Diagnosis: Increased Risk For Suicide Attempt Goal: STG-Patient Will Comply With Medication Regime Outcome: Progressing Pt c/o anxiety. Medication given with relief

## 2015-05-13 NOTE — B[facility] Group Notes (Signed)
[facility] LCSW Group Therapy  05/13/2015 3:42 PM   Type of Therapy:  Group Therapy  Participation Level:  Active  Participation Quality:  Attentive  Affect:  Appropriate  Cognitive:  Appropriate  Insight:  Improving  Engagement in Therapy:  Engaged  Modes of Intervention:  Clarification, Education, Exploration and Socialization  Summary of Progress/Problems: Today's group focused on relapse prevention.  We defined the term, and then brainstormed on ways to prevent relapse. Stayed for the first half of group, then left and did not return.  Did not talk about relapse, but rather focused on how he needs to get his license back by taking a course and paying a fine.  Stated he plans to get a job so he can do this. Michael Costa, Michael Costa 05/13/2015 , 3:42 PM

## 2015-05-14 NOTE — [facility] Group Notes (Signed)
[facility] Group Notes:  (Nursing/MHT/Case Management/Adjunct)  Date:  05/14/2015  Time:  12:54 PM  Type of Therapy:  Nurse Education  Participation Level:  Active  Participation Quality:  Appropriate  Affect:  Appropriate  Cognitive:  Appropriate  Insight:  Good  Engagement in Group:  Engaged  Modes of Intervention:  Clarification, Discussion and Education  Summary of Progress/Problems:States that as part of recovery he likes to exercise to help him to feel relaxed  and decrease his energy level.  Ottie GlazierKallam, Renay Crammer S 05/14/2015, 12:54 PM

## 2015-05-14 NOTE — Progress Notes (Signed)
Patient ID: Michael ClayJustin Costa, male   DOB: Aug 09, 1990, 25 y.o.   MRN: [id number] D:Affect is appropriate to mood. Continues with intrusive and attention seeking behaviors but does respond appropriately to redirection. A:Support and encouragement offered. R:Receptive.No complaints of pain or problems at this time.

## 2015-05-14 NOTE — Progress Notes (Signed)
Patient ID: Michael Costa, male   DOB: 04-21-1990, 25 y.o.   MRN: [id number] [facility] MD Progress Note  05/14/2015 12:14 PM Michael Costa  MRN:  [id number] Subjective:  Patient states " I am better.'       Objective : Michael Costa is a 25 y.o. Caucasian male, who is single , unemployed who lives with his mother in [place], [place] , who has a hx of depression as well as substance abuse , who presented to [facility] , IVCed by his mother for hallucinations as well as being aggressive at home.  I have discussed case with treatment team and have met with patient . Pt today continues to be anxious , but is improving. Pt also reports AH/VH as improving , sleep is less restless. Pt has been tolerating his medications well. Denies ADRs. Per staff pt continues to be making use of PRN medications for anxiety on and off, will continue to support.     Principal Problem: MDD (major depressive disorder), recurrent, severe, with psychosis ([place]) Diagnosis:   Patient Active Problem List   Diagnosis Date Noted  . MDD (major depressive disorder), recurrent, severe, with psychosis ([place]) [F33.3] 04/07/2015  . Opioid use disorder, moderate, dependence ([place]) [F11.20] 03/05/2015  . Alcohol use disorder, mild, abuse [F10.10] 03/05/2015  . 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 Tobacco use disorder [F17.200] 03/05/2015   Total Time spent with patient:  25 minutes     Past Medical History:  Past Medical History  Diagnosis Date  . Polysubstance abuse     opiates, benzos, marijuana   History reviewed. No pertinent past surgical history. Family History:  Family History  Problem Relation Age of Onset  . Mental illness Neg Hx   . Drug abuse Other     Social History:  History  Alcohol Use  . 3.0 oz/week  . 5 Cans of beer per week     History  Drug Use  . Yes  . Special: Marijuana, Hydrocodone, Oxycodone, Benzodiazepines    Comment: pain pills, opiates, benzos    Social History   Social  History  . Marital Status: Single    Spouse Name: N/A  . Number of Children: N/A  . Years of Education: N/A   Social History Main Topics  . Smoking status: Current Every Day Smoker -- 2.00 packs/day    Types: Cigarettes  . Smokeless tobacco: None  . Alcohol Use: 3.0 oz/week    5 Cans of beer per week  . Drug Use: Yes    Special: Marijuana, Hydrocodone, Oxycodone, Benzodiazepines     Comment: pain pills, opiates, benzos  . Sexual Activity: Not Currently    Birth Control/ Protection: None   Other Topics Concern  . None   Social History Narrative   Additional Social History:    Pain Medications: Oxycodone & hydrocodone Prescriptions: See [place] d/c med list-Non-compliance. Pt states "it ran out", pt did not get strattera filled due to price Over the Counter: None Reported History of alcohol / drug use?: Yes Longest period of sobriety (when/how long): 10 days Negative Consequences of Use: Work / Youth worker, Charity fundraiser relationships Withdrawal Symptoms: Irritability   Name of Substance 2: Oxycodone & Hydrocodone 2 - Age of First Use: 25 years of age 15 - Amount (size/oz): "usually a couple or Production manager" 2 - Frequency: "most of the time every day" 2 - Duration: Ongoing 2 - Last Use / Amount: 3.31.17, 3 58m Oxycodone pills  Sleep: Fair  Appetite:   Improving  Current Medications: Current Facility-Administered Medications  Medication Dose Route Frequency Provider Last Rate Last Dose  . acetaminophen (TYLENOL) tablet 650 mg  650 mg Oral Q6H PRN Niel Hummer, NP   650 mg at 05/13/15 1953  . alum & mag hydroxide-simeth (MAALOX/MYLANTA) 200-200-20 MG/5ML suspension 30 mL  30 mL Oral Q4H PRN Niel Hummer, NP   30 mL at 05/10/15 1616  . benztropine (COGENTIN) tablet 0.5 mg  0.5 mg Oral QHS Ursula Alert, MD   0.5 mg at 05/13/15 2053  . feeding supplement (ENSURE ENLIVE) (ENSURE ENLIVE) liquid 237 mL  237 mL Oral BID BM Draydon Clairmont, MD   237 mL at 05/14/15 0842  . FLUoxetine (PROZAC)  capsule 60 mg  60 mg Oral Daily Ursula Alert, MD   60 mg at 05/14/15 0840  . hydrOXYzine (ATARAX/VISTARIL) tablet 50 mg  50 mg Oral TID PRN Nanci Pina, FNP   50 mg at 05/13/15 2052  . magnesium hydroxide (MILK OF MAGNESIA) suspension 30 mL  30 mL Oral Daily PRN Niel Hummer, NP      . nicotine (NICODERM CQ - dosed in mg/24 hours) patch 21 mg  21 mg Transdermal Daily Ursula Alert, MD   21 mg at 05/14/15 0842  . OLANZapine (ZYPREXA) tablet 25 mg  25 mg Oral QHS Ursula Alert, MD   25 mg at 05/13/15 2053  . traZODone (DESYREL) tablet 50 mg  50 mg Oral QHS PRN Niel Hummer, NP   50 mg at 05/13/15 2052    Lab Results:  No results found for this or any previous visit (from the past 48 hour(s)).  Blood Alcohol level:  Lab Results  Component Value Date   ETH <5 05/04/2015   ETH 67* 04/26/2015    Physical Findings: AIMS: Facial and Oral Movements Muscles of Facial Expression: None, normal Lips and Perioral Area: None, normal Jaw: None, normal Tongue: None, normal,Extremity Movements Upper (arms, wrists, hands, fingers): None, normal Lower (legs, knees, ankles, toes): None, normal, Trunk Movements Neck, shoulders, hips: None, normal, Overall Severity Severity of abnormal movements (highest score from questions above): None, normal Incapacitation due to abnormal movements: None, normal Patient's awareness of abnormal movements (rate only patient's report): No Awareness, Dental Status Current problems with teeth and/or dentures?: No Does patient usually wear dentures?: No  CIWA:  CIWA-Ar Total: 0 COWS:  COWS Total Score: 0  Musculoskeletal: Strength & Muscle Tone: within normal limits Gait & Station: normal Patient leans: N/A  Psychiatric Specialty Exam: Review of Systems  Psychiatric/Behavioral: Positive for depression, hallucinations and substance abuse. The patient is nervous/anxious.   All other systems reviewed and are negative.  denies headache, denies chest pain,  denies shortness of breath, no vomiting  Blood pressure 128/55, pulse 83, temperature 97.6 F (36.4 C), temperature source Oral, resp. rate 16, height 5' 6"  (1.676 m), weight 70.308 kg (155 lb).Body mass index is 25.03 kg/(m^2).  General Appearance:casual  Eye Contact::  Improved   Speech:  Normal Rate  Volume:  Normal  Mood: depressed, anxious  Affect: anxious- improving  Thought Process:  generally linear  Orientation:  Full (Time, Place, and Person)  Thought Content:   (+) auditory hallucinations and VH of red lights - able to cope , improving  Suicidal Thoughts:  No at this time denies any suicidal or self injurious ideations   Homicidal Thoughts:  No  Memory:  recent and remote grossly intact , immediate - fair  Judgement:  Fair but is improving  Insight:   Limited   Psychomotor Activity:   restless , no agitation at this time   Concentration:  Fair  Recall:  Good  Fund of Knowledge:Good  Language: Good  Akathisia:  Negative  Handed:  Right  AIMS (if indicated):     Assets:  Desire for Improvement Resilience  ADL's:  Fair   Cognition: WNL  Sleep:  Number of Hours: 6    Assessment- Irvine Glorioso is a 25 y.o. Caucasian male, who is single , unemployed who lives with his mother in [place], [place] , who has a hx of depression as well as substance abuse , who presented to [place] , IVCed by his mother for hallucinations as well as being aggressive at home.Pt presents as less anxious today. Will continue treatment.     Treatment Plan Summary: Daily contact with patient to assess and evaluate symptoms and progress in treatment, Medication management, Plan inpatient treatment  and medications as below    Increased Prozac to 60 mg po daily for affective sx. Will continue  Zyprexa  25 mg po qhs for psychosis/mood sx. Will continue Cogentin 0.5 mg po qhs for EPS. Will continue to monitor vitals ,medication compliance and treatment side effects while patient is here.  Will monitor for  medical issues as well as call consult as needed.  Reviewed labs EKG for qtc- wnl , PL- 46.1 - will need to monitor on an out patient basis. Patient to participate in therapeutic milieu .  CSW will continue to work on disposition.        Lucianna Ostlund, MD 05/14/2015, 12:14 PM

## 2015-05-14 NOTE — Progress Notes (Signed)
D    Pt is intrusive at times and attention seeking     He responds well to redirection    Pt reports he hopes to be discharged tomorrow   He denies withdrawal symptoms   He is medications seeking at times  A    Verbal support given    Medications administered and effectiveness monitored   Q 15 min checks   R   Pt is safe at present time 

## 2015-05-14 NOTE — Tx Team (Signed)
Interdisciplinary Treatment Plan Update (Adult)  Date:  05/14/2015   Time Reviewed:  2:55 PM   Progress in Treatment: Attending groups: Yes. Participating in groups:  Yes. Taking medication as prescribed:  Yes. Tolerating medication:  Yes. Family/Significant other contact made:  Yes Patient understands diagnosis:  Yes  As evidenced by seeking help with anxiety, psychosis, substance abuse Discussing patient identified problems/goals with staff:  Yes, see initial care plan. Medical problems stabilized or resolved:  Yes. Denies suicidal/homicidal ideation: Yes. Issues/concerns per patient self-inventory:  No. Other:  New problem(s) identified:  Discharge Plan or Barriers: see below  Reason for Continuation of Hospitalization:   Comments:  Michael Costa is a 25 y.o. Caucasian male, single , unemployed, who lives with his mother in [place], [place].He has a hx of depression as well as substance abuse , who [facility] , IVCed by his mother for hallucinations as well as being aggressive at home.  Will start a trial of Prozac 20 mg po daily for affective sx. Will discontinue Celexa for lack of efficacy. Will increase Zyprexa to 20 mg po qhs for psychosis/mood sx. Will add Cogentin 0.5 mg po qhs for EPS. Will start CIWA/LIBRIUM prn for BZD withdrawal sx. Will start COWS/clonidine protocol for opioid abuse. Pt is motivated to go to a substance abuse treatment program.CSW will start working on disposition  Estimated length of stay:Likely d/c tomorrow  New goal(s):  Review of initial/current patient goals per problem list:   Review of initial/current patient goals per problem list:  1. Goal(s): Patient will participate in aftercare plan   Met: Yes   Target date: 3-5 days post admission date   As evidenced by: Patient will participate within aftercare plan AEB aftercare provider and housing plan at discharge being identified. 05/08/15:  Hopes to get into [facility]-back up plan is return  home and follow up outpt 05/14/15:  Decided against [facility]-return home, follow up [facility]   2. Goal (s): Patient will exhibit decreased depressive symptoms and suicidal ideations.   Met: Yes   Target date: 3-5 days post admission date   As evidenced by: Patient will utilize self rating of depression at 3 or below and demonstrate decreased signs of depression or be deemed stable for discharge by MD. 05/08/15:  Pt rates his depression a 5 today 05/14/15:  Michael Costa rates his anxiety a 3 today     3. Goal(s): Patient will demonstrate decreased signs and symptoms of anxiety.   Met: Yes   Target date: 3-5 days post admission date   As evidenced by: Patient will utilize self rating of anxiety at 3 or below and demonstrated decreased signs of anxiety, or be deemed stable for discharge by MD 05/08/15:  Pt rates his anxiety a 6 today 05/14/15:  Michael Costa rates his anxiety a 3 today     4. Goal(s): Patient will demonstrate decreased signs of withdrawal due to substance abuse   Met: Yes   Target date: 3-5 days post admission date   As evidenced by: Patient will produce a CIWA/COWS score of 0, have stable vitals signs, and no symptoms of withdrawal 05/08/15:  Minimal symptoms-COWS score is a 1 05/14/15:  No signs nor symptoms of withdrawal today     5. Goal(s): Patient will demonstrate decreased signs of psychosis  * Met: Yes  * Target date: 3-5 days post admission date  * As evidenced by: Patient will demonstrate decreased frequency of AVH or return to baseline function 05/08/15: Prior to admission, pt was c/o  AH/VH, and his mother was c/o anger, aggression, mood lability.  He is willing to engage in a Zyprexa trial. 05/14/15:  Denies psychosis today          Attendees: Patient:  05/14/2015 2:55 PM   Family:   05/14/2015 2:55 PM   Physician:  Ursula Alert, MD 05/14/2015 2:55 PM   Nursing:   Serita Grammes, RN 05/14/2015 2:55 PM   CSW:    Roque Lias, LCSW   05/14/2015 2:55 PM    Other:  05/14/2015 2:55 PM   Other:   05/14/2015 2:55 PM   Other:  Lars Pinks, Nurse CM 05/14/2015 2:55 PM   Other:   05/14/2015 2:55 PM   Other:  Norberto Sorenson, [place]  05/14/2015 2:55 PM   Other:  05/14/2015 2:55 PM   Other:  05/14/2015 2:55 PM   Other:  05/14/2015 2:55 PM   Other:  05/14/2015 2:55 PM   Other:  05/14/2015 2:55 PM   Other:   05/14/2015 2:55 PM    Scribe for Treatment Team:   Trish Mage, 05/14/2015 2:55 PM

## 2015-05-14 NOTE — [facility] Group Notes (Signed)
[facility] LCSW Group Therapy  05/14/2015 1:15 pm  Type of Therapy: Process Group Therapy  Participation Level:  Active  Participation Quality:  Appropriate  Affect:  Flat  Cognitive:  Oriented  Insight:  Improving  Engagement in Group:  Limited  Engagement in Therapy:  Limited  Modes of Intervention:  Activity, Clarification, Education, Problem-solving and Support  Summary of Progress/Problems: Today's group addressed the issue of overcoming obstacles.  Patients were asked to identify their biggest obstacle post d/c that stands in the way of their on-going success, and then problem solve as to how to manage this. In and out of group multiple times.  Each time went directly to thermostat, adjusted it, sat down for 5 minutes and left.  No meaningful contribution.  Daryel Geraldorth, Lakesia Dahle B 05/14/2015   1:53 PM

## 2015-05-14 NOTE — Progress Notes (Signed)
Adult Psychoeducational Group Note  Date:  05/14/2015 Time:  8:51 PM  Group Topic/Focus:  Wrap-Up Group:   The focus of this group is to help patients review their daily goal of treatment and discuss progress on daily workbooks.  Participation Level:  Active  Participation Quality:  Appropriate and Attentive  Affect:  Appropriate  Cognitive:  Appropriate  Insight: Appropriate  Engagement in Group:  Engaged  Modes of Intervention:  Discussion  Additional Comments: Pt had a good day. Pt goal is to get discharge and start looking for a job.   Merlinda FrederickKeshia S Mckinsley Koelzer 05/14/2015, 8:51 PM

## 2015-05-15 ENCOUNTER — Encounter (HOSPITAL_COMMUNITY): Payer: Self-pay | Admitting: Psychiatry

## 2015-05-15 MED ORDER — NICOTINE 21 MG/24HR TD PT24: 21.0000 mg | MEDICATED_PATCH | Freq: Every day | TRANSDERMAL | Status: DC

## 2015-05-15 MED ORDER — TRAZODONE HCL 50 MG PO TABS: 50.0000 mg | ORAL_TABLET | Freq: Every evening | ORAL | Status: DC | PRN

## 2015-05-15 MED ORDER — OLANZAPINE 5 MG PO TABS
25.0000 mg | ORAL_TABLET | Freq: Every day | ORAL | Status: DC
  Filled 2015-05-15 (×2): qty 35

## 2015-05-15 MED ORDER — HYDROXYZINE HCL 50 MG PO TABS: 50.0000 mg | ORAL_TABLET | Freq: Four times a day (QID) | ORAL | Status: DC | PRN

## 2015-05-15 MED ORDER — HYDROXYZINE HCL 50 MG PO TABS
50.0000 mg | ORAL_TABLET | Freq: Four times a day (QID) | ORAL | Status: DC | PRN
Start: 2015-05-15 — End: 2015-05-15
  Filled 2015-05-15: qty 10

## 2015-05-15 MED ORDER — OLANZAPINE 5 MG PO TABS: 25.0000 mg | ORAL_TABLET | Freq: Every day | ORAL | Status: DC

## 2015-05-15 MED ORDER — BENZTROPINE MESYLATE 0.5 MG PO TABS: 0.5000 mg | ORAL_TABLET | Freq: Every day | ORAL | Status: DC

## 2015-05-15 MED ORDER — FLUOXETINE HCL 20 MG PO CAPS: 60.0000 mg | ORAL_CAPSULE | Freq: Every day | ORAL | Status: DC

## 2015-05-15 NOTE — [facility] Suicide Risk Assessment (Signed)
[facility]   Principal Problem: MDD (major depressive disorder), recurrent, severe, with psychosis (HCC) Discharge Diagnoses:  Patient Active Problem List   Diagnosis Date Noted  . MDD (major depressive disorder), recurrent, severe, with psychosis (HCC) [F33.3] 04/07/2015  . 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 Alcohol use disorder, mild, abuse [F10.10] 03/05/2015  . 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 Tobacco use disorder [F17.200] 03/05/2015    Total Time spent with patient: 30 minutes  Musculoskeletal: Strength & Muscle Tone: within normal limits Gait & Station: normal Patient leans: N/A  Psychiatric Specialty Exam: Review of Systems  Psychiatric/Behavioral: Negative for depression. The patient is nervous/anxious (improving).   All other systems reviewed and are negative.   Blood pressure 126/88, pulse 79, temperature 97.7 F (36.5 C), temperature source Oral, resp. rate 18, height 5\' 6"  (1.676 m), weight 70.308 kg (155 lb).Body mass index is 25.03 kg/(m^2).  General Appearance: Casual  Eye Contact::  Fair  Speech:  Clear and Coherent409  Volume:  Normal  Mood:  Anxious improving  Affect:  Appropriate  Thought Process:  Coherent  Orientation:  Full (Time, Place, and Person)  Thought Content:  WDL  Suicidal Thoughts:  No  Homicidal Thoughts:  No  Memory:  Immediate;   Fair Recent;   Fair Remote;   Fair  Judgement:  Fair  Insight:  Fair  Psychomotor Activity:  Normal  Concentration:  Fair  Recall:  Fis[employer]Fair  Fund of Knowledge:Fair  Language: Fair  Akathisia:  No  Handed:  Right  AIMS (if indicated):     Assets:  Desire for Improvement  Sleep:  Number of Hours: 6  Cognition: WNL  ADL's:  Intact   Mental Status Per Nursing Assessment::   On Admission:  NA  Demographic Factors:  Male and Caucasian  Loss Factors: Financial problems/change in socioeconomic status  Historical  Factors: Impulsivity  Risk Reduction Factors:   Positive social support  Continued Clinical Symptoms:  Alcohol/Substance Abuse/Dependencies Previous Psychiatric Diagnoses and Treatments  Cognitive Features That Contribute To Risk:  Polarized thinking    Suicide Risk:  Minimal: No identifiable suicidal ideation.  Patients presenting with no risk factors but with morbid ruminations; may be classified as minimal risk based on the severity of the depressive symptoms  Follow-up Information    [facility].   Why:  Monday at 9:00 for your hospital follow up appointment   Contact information:   [address] [place] [address] [phone]       Plan Of Care/Follow-up recommendations:  Activity:  no restrictions Diet:  regular Tests:  as needed Other:  follow up with aftercare  Franny Selvage, MD 05/15/2015, 9:15 AM

## 2015-05-15 NOTE — Progress Notes (Signed)
  [facility] :  Will you be returning to the same living situation after discharge:  Yes,  home At discharge, do you have transportation home?: Yes,  mother Do you have the ability to pay for your medications: Yes,  mental health  Release of information consent forms completed and in the chart;  Patient's signature needed at discharge.  Patient to Follow up at: Follow-up Information    [facility].   Why:  Monday at 9:00 for your hospital follow up appointment   Contact information:   [address] [place] [address] [phone]       Next level of care provider has access to [facility] Link:no  Safety Planning and Suicide Prevention discussed: Yes,  yes  Have you used any form of tobacco in the last 30 days? (Cigarettes, Smokeless Tobacco, Cigars, and/or Pipes): Yes  Has patient been referred to the Quitline?: Yes, faxed on 05/15/15  Patient has been referred for addiction treatment: Yes  Ida Rogueorth, Xin Klawitter B 05/15/2015, 11:52 AM

## 2015-05-15 NOTE — Discharge Summary (Signed)
Physician Discharge Summary Note  Patient:  Michael Costa is an 25 y.o., male MRN:  [id number] DOB:  [birth year] Patient phone:  [phone] (home)  Patient address:   [address] [place] [address],  Total Time spent with patient: Greater than 30 minutes  Date of Admission:  05/06/2015  Date of Discharge: 05/15/2015  Reason for Admission:   Principal Problem: MDD (major depressive disorder), recurrent, severe, with psychosis [facility])  Discharge Diagnoses: Patient Active Problem List   Diagnosis Date Noted  . MDD (major depressive disorder), recurrent, severe, with psychosis (HCC) [F33.3] 04/07/2015  . 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 Alcohol use disorder, mild, abuse [F10.10] 03/05/2015  . 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 Tobacco use disorder [F17.200] 03/05/2015   Past Psychiatric History: Opioid, Alcohol, Benzodiazepine/Tobacco use disorder, Major depression  Past Medical History:  Past Medical History  Diagnosis Date  . Polysubstance abuse     opiates, benzos, marijuana   History reviewed. No pertinent past surgical history. Family History:  Family History  Problem Relation Age of Onset  . Mental illness Neg Hx   . Drug abuse Other    Family Psychiatric  History:  See above noted Social History:  History  Alcohol Use  . 3.0 oz/week  . 5 Cans of beer per week     History  Drug Use  . Yes  . Special: Marijuana, Hydrocodone, Oxycodone, Benzodiazepines    Comment: pain pills, opiates, benzos    Social History   Social History  . Marital Status: Single    Spouse Name: N/A  . Number of Children: N/A  . Years of Education: N/A   Social History Main Topics  . Smoking status: Current Every Day Smoker -- 2.00 packs/day    Types: Cigarettes  . Smokeless tobacco: None  . Alcohol Use: 3.0 oz/week    5 Cans of beer per week  . Drug Use: Yes    Special: Marijuana, Hydrocodone, Oxycodone, Benzodiazepines      Comment: pain pills, opiates, benzos  . Sexual Activity: Not Currently    Birth Control/ Protection: None   Other Topics Concern  . None   Social History Narrative   Hospital Course: Michael Costa is a 25 y.o. Caucasian male, who is single, unemployed who lives with his mother in [place], [place] , who has a hx of depression as well as substance abuse, who presented to [facility] , IVCed by his mother for hallucinations as well as being aggressive at home.  Keshun was admitted to the [facility] unit with his UDS test results positive for benzodiazepine. However, he was involuntarily committed to the hospital by his family for worsening symptoms of depression with psychosis. During his admission assessment, Atzel admitted to having been abusing opioid & xanax pills. He was buying then off the street. Chart review indicated Mackenzy being aggressive at home as well. He was in need of opioid detox as well as mood stabilization treatments. After admission assessment/evaluation, his presenting symptoms were identified. The medication regimen targeting those symptoms were discussed & initiated. He received Clonidine detoxification treatment protocols to combat the withdrawal symptoms of opioid. He was also medicated & discharged on; Fluoxetine 20 mg for depression, Benztropine 0.5 mg for EPS, Hydroxyzine 50 mg for anxiety, Olanzapine 5 mg for mood control & Trazodone 50 mg for insomnia. He tolerated his treatment regimen without any adverse effects reported. He was also enrolled & participated in the group counseling sessions being offered &  held on this unit. He learned coping skills that should help him further to cope better & manage his depression/substance abuse issues after discharge.   Jill AlexandersJustin has completed detox treatment & his mood is now stable. This is evidenced by his reports of improved mood, absence of suicidal ideations & or substance withdrawal symptoms. He is currently being discharged to continue further  substance abuse treatment [facility] in [place], [place]. He is provided with all the pertinent information needed to make this appointment without problems.  Upon discharge, Jill AlexandersJustin adamantly denies any SIHI, AVH, delusional thoughts, paranoia or substance withdrawal symptoms. He is provided with a 7 days worth, supply samples of his [facility]. [facility] in no apparent distress. Transportation per mother.  Physical Findings:  AIMS: Facial and Oral Movements Muscles of Facial Expression: None, normal Lips and Perioral Area: None, normal Jaw: None, normal Tongue: None, normal,Extremity Movements Upper (arms, wrists, hands, fingers): None, normal Lower (legs, knees, ankles, toes): None, normal, Trunk Movements Neck, shoulders, hips: None, normal, Overall Severity Severity of abnormal movements (highest score from questions above): None, normal Incapacitation due to abnormal movements: None, normal Patient's awareness of abnormal movements (rate only patient's report): No Awareness, Dental Status Current problems with teeth and/or dentures?: No Does patient usually wear dentures?: No  CIWA:  CIWA-Ar Total: 0 COWS:  COWS Total Score: 0  Musculoskeletal: Strength & Muscle Tone: within normal limits Gait & Station: normal Patient leans: N/A  Psychiatric Specialty Exam:  See MD SRA Review of Systems  Constitutional: Negative.   HENT: Negative.   Eyes: Negative.   Respiratory: Negative.   Cardiovascular: Negative.   Gastrointestinal: Negative.   Genitourinary: Negative.   Musculoskeletal: Negative.   Skin: Negative.   Neurological: Negative.   Endo/Heme/Allergies: Negative.   Psychiatric/Behavioral: Positive for depression (Stable) and substance abuse ( Tobacco/benzodiazepine abuse). Negative for suicidal ideas, hallucinations and memory loss. The patient has insomnia (Stable). The patient is not nervous/anxious.   All other  systems reviewed and are negative.   Blood pressure 126/88, pulse 79, temperature 97.7 F (36.5 C), temperature source Oral, resp. rate 18, height 5\' 6"  (1.676 m), weight 70.308 kg (155 lb).Body mass index is 25.03 kg/(m^2).See Md's SRA  Have you used any form of tobacco in the last 30 days? (Cigarettes, Smokeless Tobacco, Cigars, and/or Pipes): Yes  Has this patient used any form of tobacco in the last 30 days? (Cigarettes, Smokeless Tobacco, Cigars, and/or Pipes) Yes, Yes, A prescription for an FDA-approved tobacco cessation medication was offered at discharge and the patient refused  Blood Alcohol level:  Lab Results  Component Value Date   [facility] 05/04/2015   ETH 67* 04/26/2015   Metabolic Disorder Labs:  Lab Results  Component Value Date   HGBA1C 5.3 04/09/2015   MPG 105 04/09/2015   Lab Results  Component Value Date   PROLACTIN 46.1* 05/08/2015   Lab Results  Component Value Date   CHOL 130 04/09/2015   TRIG 72 04/09/2015   HDL 31* 04/09/2015   CHOLHDL 4.2 04/09/2015   VLDL 14 04/09/2015   LDLCALC 85 04/09/2015   See Psychiatric Specialty Exam and Suicide Risk Assessment completed by Attending Physician prior to discharge.  Discharge destination:  Home  Is patient on multiple antipsychotic therapies at discharge:  No   Has Patient had three or more failed trials of antipsychotic monotherapy by history:  No  Recommended Plan for Multiple Antipsychotic Therapies: NA  Medication List    STOP taking these medications        citalopram 20 MG tablet  Commonly known as:  CELEXA      TAKE these medications      Indication   benztropine 0.5 MG tablet  Commonly known as:  COGENTIN  Take 1 tablet (0.5 mg total) by mouth at bedtime. For prevention of drug induced tremors   Indication:  Extrapyramidal Reaction caused by Medications     FLUoxetine 20 MG capsule  Commonly known as:  PROZAC  Take 3 capsules (60 mg total) by mouth daily. For depression    Indication:  Major Depressive Disorder     hydrOXYzine 50 MG tablet  Commonly known as:  ATARAX/VISTARIL  Take 1 tablet (50 mg total) by mouth every 6 (six) hours as needed for anxiety.   Indication:  Anxiety Neurosis     nicotine 21 mg/24hr patch  Commonly known as:  NICODERM CQ - dosed in mg/24 hours  Place 1 patch (21 mg total) onto the skin daily. For nicotine addiction   Indication:  Nicotine Addiction     OLANZapine 5 MG tablet  Commonly known as:  ZYPREXA  Take 5 tablets (25 mg total) by mouth at bedtime. For mood control   Indication:  Mood control     traZODone 50 MG tablet  Commonly known as:  DESYREL  Take 1 tablet (50 mg total) by mouth at bedtime as needed for sleep.   Indication:  Trouble Sleeping       Follow-up Information    Follow up with [facility] On 05/20/2015.   Why:  Monday at 9:00 for your hospital follow up appointment   Contact information:   [address] [place] [address] [phone]      Follow-up recommendations: Activity:  As tolerated Diet: As recommended by your primary care doctor. Keep all scheduled follow-up appointments as recommended.  Comments: Take all your medications as prescribed by your mental healthcare provider. Report any adverse effects and or reactions from your medicines to your outpatient provider promptly. Patient is instructed and cautioned to not engage in alcohol and or illegal drug use while on prescription medicines. In the event of worsening symptoms, patient is instructed to call the crisis hotline, 911 and or go to the nearest ED for appropriate evaluation and treatment of symptoms. Follow-up with your primary care provider for your other medical issues, concerns and or health care needs.   Signed: Sanjuana Kava, NP, PMHNP-BC 05/15/2015, 10:50 AM

## 2015-05-15 NOTE — Progress Notes (Signed)
Patient ID: Michael Costa, male   DOB: Dec 21, 1990, 25 y.o.   MRN: [id number]  Pt. Denies SI/HI and A/V hallucinations. Belongings returned to patient at time of discharge. Patient denies any pain or discomfort. Discharge instructions and medications were reviewed with patient. Patient verbalized understanding of both medications and discharge instructions. Patient was discharged to a cab where he was driven to [place], address he provided. No distress noted upon discharge. Q15 minute safety checks maintained until discharge.

## 2015-12-20 ENCOUNTER — Emergency Department (HOSPITAL_COMMUNITY)
Admission: EM | Admit: 2015-12-20 | Discharge: 2015-12-20 | Payer: Self-pay | Attending: Psychiatry | Admitting: Psychiatry

## 2015-12-20 ENCOUNTER — Encounter (HOSPITAL_COMMUNITY): Payer: Self-pay | Admitting: Emergency Medicine

## 2015-12-20 ENCOUNTER — Emergency Department (HOSPITAL_COMMUNITY): Payer: Self-pay

## 2015-12-20 ENCOUNTER — Encounter (HOSPITAL_COMMUNITY): Payer: Self-pay | Admitting: *Deleted

## 2015-12-20 ENCOUNTER — Inpatient Hospital Stay (HOSPITAL_COMMUNITY)
Admission: AD | Admit: 2015-12-20 | Discharge: 2015-12-25 | DRG: 885 | Disposition: A | Discharge status: Home / Self Care | Payer: No Typology Code available for payment source | Source: Intra-hospital | Attending: Psychiatry | Admitting: Psychiatry

## 2015-12-20 DIAGNOSIS — Z716 Tobacco abuse counseling: Secondary | ICD-10-CM

## 2015-12-20 DIAGNOSIS — R45851 Suicidal ideations: Secondary | ICD-10-CM

## 2015-12-20 DIAGNOSIS — Z818 Family history of other mental and behavioral disorders: Secondary | ICD-10-CM | POA: Diagnosis not present

## 2015-12-20 DIAGNOSIS — F1120 Opioid dependence, uncomplicated: Secondary | ICD-10-CM | POA: Diagnosis present

## 2015-12-20 DIAGNOSIS — Z888 Allergy status to other drugs, medicaments and biological substances status: Secondary | ICD-10-CM

## 2015-12-20 DIAGNOSIS — Z79899 Other long term (current) drug therapy: Secondary | ICD-10-CM | POA: Diagnosis not present

## 2015-12-20 DIAGNOSIS — F332 Major depressive disorder, recurrent severe without psychotic features: Principal | ICD-10-CM | POA: Diagnosis present

## 2015-12-20 DIAGNOSIS — R443 Hallucinations, unspecified: Secondary | ICD-10-CM

## 2015-12-20 DIAGNOSIS — F333 Major depressive disorder, recurrent, severe with psychotic symptoms: Secondary | ICD-10-CM | POA: Diagnosis not present

## 2015-12-20 DIAGNOSIS — Z813 Family history of other psychoactive substance abuse and dependence: Secondary | ICD-10-CM | POA: Diagnosis not present

## 2015-12-20 DIAGNOSIS — F17210 Nicotine dependence, cigarettes, uncomplicated: Secondary | ICD-10-CM | POA: Diagnosis present

## 2015-12-20 DIAGNOSIS — F259 Schizoaffective disorder, unspecified: Secondary | ICD-10-CM | POA: Diagnosis not present

## 2015-12-20 DIAGNOSIS — F1020 Alcohol dependence, uncomplicated: Secondary | ICD-10-CM | POA: Insufficient documentation

## 2015-12-20 DIAGNOSIS — F1920 Other psychoactive substance dependence, uncomplicated: Secondary | ICD-10-CM | POA: Diagnosis present

## 2015-12-20 HISTORY — DX: Other specified health status: Z78.9

## 2015-12-20 LAB — CBC WITH DIFFERENTIAL/PLATELET
BASOS ABS: 0 10*3/uL (ref 0.0–0.1)
BASOS PCT: 0 %
EOS ABS: 0.2 10*3/uL (ref 0.0–0.7)
Eosinophils Relative: 2 %
HCT: 49.2 % (ref 39.0–52.0)
Hemoglobin: 17.2 g/dL — ABNORMAL HIGH (ref 13.0–17.0)
Lymphocytes Relative: 20 %
Lymphs Abs: 2.3 10*3/uL (ref 0.7–4.0)
MCH: 30.8 pg (ref 26.0–34.0)
MCHC: 35 g/dL (ref 30.0–36.0)
MCV: 88.2 fL (ref 78.0–100.0)
MONO ABS: 0.8 10*3/uL (ref 0.1–1.0)
MONOS PCT: 7 %
NEUTROS ABS: 8.1 10*3/uL — AB (ref 1.7–7.7)
NEUTROS PCT: 71 %
PLATELETS: 300 10*3/uL (ref 150–400)
RBC: 5.58 MIL/uL (ref 4.22–5.81)
RDW: 13.3 % (ref 11.5–15.5)
WBC: 11.4 10*3/uL — ABNORMAL HIGH (ref 4.0–10.5)

## 2015-12-20 LAB — BASIC METABOLIC PANEL WITH GFR
Anion gap: 10 (ref 5–15)
BUN: 11 mg/dL (ref 6–20)
CO2: 26 mmol/L (ref 22–32)
Calcium: 9.9 mg/dL (ref 8.9–10.3)
Chloride: 100 mmol/L — ABNORMAL LOW (ref 101–111)
Creatinine, Ser: 0.94 mg/dL (ref 0.61–1.24)
GFR calc Af Amer: >60 mL/min
GFR calc non Af Amer: >60 mL/min
Glucose, Bld: 94 mg/dL (ref 65–99)
Potassium: 4 mmol/L (ref 3.5–5.1)
Sodium: 136 mmol/L (ref 135–145)

## 2015-12-20 LAB — RAPID URINE DRUG SCREEN, HOSP PERFORMED
Amphetamines: NOT DETECTED
BENZODIAZEPINES: NOT DETECTED
Barbiturates: NOT DETECTED
COCAINE: NOT DETECTED
OPIATES: NOT DETECTED
Tetrahydrocannabinol: NOT DETECTED

## 2015-12-20 LAB — ETHANOL: Alcohol, Ethyl (B): 5 mg/dL — ABNORMAL HIGH (ref <5)

## 2015-12-20 MED ORDER — ACETAMINOPHEN 500 MG PO TABS
1000.0000 mg | ORAL_TABLET | Freq: Once | ORAL | Status: AC
  Administered 2015-12-20: 1000 mg via ORAL
  Filled 2015-12-20: qty 2

## 2015-12-20 MED ORDER — NICOTINE 21 MG/24HR TD PT24: 21.0000 mg | MEDICATED_PATCH | Freq: Every day | TRANSDERMAL | Status: DC | PRN

## 2015-12-20 MED ORDER — IBUPROFEN 400 MG PO TABS: 600.0000 mg | ORAL_TABLET | Freq: Three times a day (TID) | ORAL | Status: DC | PRN

## 2015-12-20 MED ORDER — LORAZEPAM 1 MG PO TABS: 1.0000 mg | ORAL_TABLET | Freq: Three times a day (TID) | ORAL | Status: DC | PRN

## 2015-12-20 MED ORDER — IBUPROFEN 400 MG PO TABS
400.0000 mg | ORAL_TABLET | Freq: Once | ORAL | Status: AC
  Administered 2015-12-20: 400 mg via ORAL
  Filled 2015-12-20: qty 1

## 2015-12-20 MED ORDER — LORAZEPAM 1 MG PO TABS
1.0000 mg | ORAL_TABLET | Freq: Once | ORAL | Status: AC
  Administered 2015-12-20: 1 mg via ORAL
  Filled 2015-12-20: qty 1

## 2015-12-20 MED ORDER — ACETAMINOPHEN 325 MG PO TABS: 650.0000 mg | ORAL_TABLET | ORAL | Status: DC | PRN

## 2015-12-20 MED ORDER — ALUM & MAG HYDROXIDE-SIMETH 200-200-20 MG/5ML PO SUSP: 30.0000 mL | ORAL | Status: DC | PRN

## 2015-12-20 NOTE — ED Notes (Signed)
MD at bedside.  MD assessment confirms rib pain.  No further orders at this time.

## 2015-12-20 NOTE — ED Notes (Signed)
Pt currently on the telephone

## 2015-12-20 NOTE — ED Triage Notes (Addendum)
Pt reports anger issues today and trying to break a window at home. Pt also reports SI without a plan. Pt denies HI. Pt reports he has not been taking his antidepressants for weeks. Pt reports seeing "red light and shadows of people". Pt also reports "hearing voices answering questions like a play back in my head".   Per [facility]-patient reports "there are mind control drones". Grandmother also reports questionable SA of methamphetamines and snorting pills.

## 2015-12-20 NOTE — ED Notes (Signed)
Pt pacing at bedside. MD made aware and verbal order for 1mg  Ativan given.

## 2015-12-20 NOTE — ED Notes (Signed)
Pt request his trazodone- He is informed that his trazodone can wait until [facility] through [facility]

## 2015-12-20 NOTE — ED Notes (Signed)
Pt is stating that he is having chest pain.

## 2015-12-20 NOTE — ED Notes (Signed)
Michael Costa: Mother contact:   [phone]

## 2015-12-20 NOTE — ED Provider Notes (Signed)
[facility]-EMERGENCY DEPT Provider Note   CSN: [id number] Arrival date & time: 12/20/15  1217     History   Chief Complaint Chief Complaint  Patient presents with  . Medical Clearance    HPI Donata ClayJustin Sivils is a 25 y.o. male.  HPI Pt was seen at 1235. Per Police and pt, c/o gradual onset and worsening of persistent "anger issues" and SI for the past day. Pt states he has not been taking his antidepressants "for weeks." Stated to Police "there are mind control drones," "seeing red light and shadows of people," and "hearing voices answering questions like a playback in my head" and "that sometime say to hurt myself." Denies SA, no HI.   Past Medical History:  Diagnosis Date  . Polysubstance abuse    opiates, benzos, marijuana    Patient Active Problem List   Diagnosis Date Noted  . MDD (major depressive disorder), recurrent, severe, with psychosis (HCC) 04/07/2015  . Opioid use disorder, moderate, dependence (HCC) 03/05/2015  . Alcohol use disorder, mild, abuse 03/05/2015  . Moderate benzodiazepine use disorder (HCC) 03/05/2015  . Tobacco use disorder 03/05/2015    History reviewed. No pertinent surgical history.     Home Medications    Prior to Admission medications   Medication Sig Start Date End Date Taking? Authorizing Provider  benztropine (COGENTIN) 0.5 MG tablet Take 1 tablet (0.5 mg total) by mouth at bedtime. For prevention of drug induced tremors 05/15/15   Sanjuana KavaAgnes I Nwoko, NP  FLUoxetine (PROZAC) 20 MG capsule Take 3 capsules (60 mg total) by mouth daily. For depression 05/15/15   Sanjuana KavaAgnes I Nwoko, NP  hydrOXYzine (ATARAX/VISTARIL) 50 MG tablet Take 1 tablet (50 mg total) by mouth every 6 (six) hours as needed for anxiety. 05/15/15   Sanjuana KavaAgnes I Nwoko, NP  nicotine (NICODERM CQ - DOSED IN MG/24 HOURS) 21 mg/24hr patch Place 1 patch (21 mg total) onto the skin daily. For nicotine addiction 05/15/15   Sanjuana KavaAgnes I Nwoko, NP  OLANZapine (ZYPREXA) 5 MG tablet Take 5 tablets (25 mg total)  by mouth at bedtime. For mood control 05/15/15   Sanjuana KavaAgnes I Nwoko, NP  traZODone (DESYREL) 50 MG tablet Take 1 tablet (50 mg total) by mouth at bedtime as needed for sleep. 05/15/15   Sanjuana KavaAgnes I Nwoko, NP    Family History Family History  Problem Relation Age of Onset  . Drug abuse Other   . Mental illness Neg Hx     Social History Social History  Substance Use Topics  . Smoking status: Current Every Day Smoker    Packs/day: 2.00    Types: Cigarettes  . Smokeless tobacco: Never Used  . Alcohol use 3.0 oz/week    5 Cans of beer per week     Comment: occasional     Allergies   Adhesive [tape]   Review of Systems Review of Systems ROS: Statement: All systems negative except as marked or noted in the HPI; Constitutional: Negative for fever and chills. ; ; Eyes: Negative for eye pain, redness and discharge. ; ; ENMT: Negative for ear pain, hoarseness, nasal congestion, sinus pressure and sore throat. ; ; Cardiovascular: Negative for chest pain, palpitations, diaphoresis, dyspnea and peripheral edema. ; ; Respiratory: Negative for cough, wheezing and stridor. ; ; Gastrointestinal: Negative for nausea, vomiting, diarrhea, abdominal pain, blood in stool, hematemesis, jaundice and rectal bleeding. . ; ; Genitourinary: Negative for dysuria, flank pain and hematuria. ; ; Musculoskeletal: Negative for back pain and neck pain. Negative for swelling  and trauma.; ; Skin: Negative for pruritus, rash, abrasions, blisters, bruising and skin lesion.; ; Neuro: Negative for headache, lightheadedness and neck stiffness. Negative for weakness, altered level of consciousness, altered mental status, extremity weakness, paresthesias, involuntary movement, seizure and syncope.; Psych:  +SI, no SA, no HI, +hallucinations.     Physical Exam Updated Vital Signs BP 119/93   Pulse 118   Temp 98.3 F (36.8 C)   Resp 18   Ht 5\' 6"  (1.676 m)   Wt 156 lb (70.8 kg)   SpO2 97%   BMI 25.18 kg/m   Physical  Exam 1240: Physical examination:  Nursing notes reviewed; Vital signs and O2 SAT reviewed;  Constitutional: Well developed, Well nourished, Well hydrated, In no acute distress; Head:  Normocephalic, atraumatic; Eyes: EOMI, PERRL, No scleral icterus; ENMT: Mouth and pharynx normal, Mucous membranes moist; Neck: Supple, Full range of motion; Cardiovascular: Regular rate and rhythm; Respiratory: Breath sounds clear, No wheezes.  Speaking full sentences with ease, Normal respiratory effort/excursion; Chest: No deformity, Movement normal; Abdomen: Nondistended; Extremities: No deformity.; Neuro: AA&Ox3, Major CN grossly intact.  Speech clear. No gross focal motor deficits in extremities. Climbs on and off stretcher easily by himself. Gait steady.; Skin: Color normal, Warm, Dry.; Psych:  Affect flat.    ED Treatments / Results  Labs (all labs ordered are listed, but only abnormal results are displayed)   EKG  EKG Interpretation None       Radiology   Procedures Procedures (including critical care time)  Medications Ordered in ED Medications - No data to display   Initial Impression / Assessment and Plan / ED Course  I have reviewed the triage vital signs and the nursing notes.  Pertinent labs & imaging results that were available during my care of the patient were reviewed by me and considered in my medical decision making (see chart for details).  MDM Reviewed: previous chart, nursing note and vitals Reviewed previous: labs Interpretation: labs and x-ray    Results for orders placed or performed during the hospital encounter of 12/20/15  Ethanol  Result Value Ref Range   Alcohol, Ethyl (B) 5 (H) <5 mg/dL  CBC with Differential  Result Value Ref Range   WBC 11.4 (H) 4.0 - 10.5 K/uL   RBC 5.58 4.22 - 5.81 MIL/uL   Hemoglobin 17.2 (H) 13.0 - 17.0 g/dL   HCT 65.7 84.6 - 96.2 %   MCV 88.2 78.0 - 100.0 fL   MCH 30.8 26.0 - 34.0 pg   MCHC 35.0 30.0 - 36.0 g/dL   RDW 95.2 84.1  - 32.4 %   Platelets 300 150 - 400 K/uL   Neutrophils Relative % 71 %   Neutro Abs 8.1 (H) 1.7 - 7.7 K/uL   Lymphocytes Relative 20 %   Lymphs Abs 2.3 0.7 - 4.0 K/uL   Monocytes Relative 7 %   Monocytes Absolute 0.8 0.1 - 1.0 K/uL   Eosinophils Relative 2 %   Eosinophils Absolute 0.2 0.0 - 0.7 K/uL   Basophils Relative 0 %   Basophils Absolute 0.0 0.0 - 0.1 K/uL  Urine rapid drug screen (hosp performed)  Result Value Ref Range   Opiates NONE DETECTED NONE DETECTED   Cocaine NONE DETECTED NONE DETECTED   Benzodiazepines NONE DETECTED NONE DETECTED   Amphetamines NONE DETECTED NONE DETECTED   Tetrahydrocannabinol NONE DETECTED NONE DETECTED   Barbiturates NONE DETECTED NONE DETECTED  Basic metabolic panel  Result Value Ref Range   Sodium 136 135 -  145 mmol/L   Potassium 4.0 3.5 - 5.1 mmol/L   Chloride 100 (L) 101 - 111 mmol/L   CO2 26 22 - 32 mmol/L   Glucose, Bld 94 65 - 99 mg/dL   BUN 11 6 - 20 mg/dL   Creatinine, Ser 1.610.94 0.61 - 1.24 mg/dL   Calcium 9.9 8.9 - 09.610.3 mg/dL   GFR calc non Af Amer >60 >60 mL/min   GFR calc Af Amer >60 >60 mL/min   Anion gap 10 5 - 15   Dg Ribs Unilateral W/chest Left Result Date: 12/20/2015 CLINICAL DATA:  Left-sided rib cage pain without known injury. Duration of symptoms 1 month. Symptoms are worsening. EXAM: LEFT RIBS AND CHEST - 3+ VIEW COMPARISON:  None in PACs FINDINGS: The lungs are adequately inflated. There is no focal infiltrate. There is no pleural effusion or pneumothorax. The mediastinum is normal in width. Left rib detail images reveal the ribs to be adequately mineralized. There is no lytic or blastic lesion. No acute or old fracture is observed. IMPRESSION: No left rib abnormality is observed. There is no acute cardiopulmonary abnormality. Is possible a that the patient's symptoms could reflect costochondritis? Electronically Signed   By: David  SwazilandJordan M.D.   On: 12/20/2015 14:24    1430:  Pt c/o left upper anterior ribs "pain" for  the past 1 month.  +TTP left upper anterior ribs and parasternal areas, no rash, no deformity, no soft tissue crepitus. Denies trauma. XR negative. Likely msk pain; tx symptomatically. TTS eval pending.   2030:  TTS has evaluated pt: inpt treatment recommended, pt remains voluntary, [facility].     Final Clinical Impressions(s) / ED Diagnoses   Final diagnoses:  None    New Prescriptions New Prescriptions   No medications on file     Samuel JesterKathleen Joice Nazario, DO 12/20/15 2033

## 2015-12-20 NOTE — ED Notes (Signed)
Pt request a shower- he is informed that he can have a shower when the department is less busy- Pt agrees and states understanding  He then reports to BrumleyJohn, NT that he wants a shower- He is again informed that he will be taken to shower when the department is less busy
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
## 2015-12-20 NOTE — ED Notes (Signed)
Report to Fransico MichaelKim Brooks, RN

## 2015-12-20 NOTE — ED Notes (Signed)
[facility]

## 2015-12-20 NOTE — ED Notes (Signed)
Nurse spoke with patients mother who states pt was recently in [place] and transferred to a facility in [place] one month ago. Since then pt hasn't been taking medication as prescribed. She states he isn't taking the medication at all or he has been snorting his tylenol. She states he will talk to be people who are not there. States he hasn't slept in 2 days, nor has he eaten. [facility] that he didn't go to.

## 2015-12-20 NOTE — ED Notes (Addendum)
Patient changed out into scrubs. [name] took belongings to desk.Patient walked to the restroom with [employer] to use restroom.

## 2015-12-20 NOTE — ED Notes (Addendum)
Pt is increasingly agitated concerning his disposition- He reports that he was told that a disposition/recommendation would be told to him in 20 minutes He is encouraged to be patient as he has waited several hours for an assessment and the decision may not come within the time he reports

## 2015-12-21 DIAGNOSIS — F332 Major depressive disorder, recurrent severe without psychotic features: Secondary | ICD-10-CM | POA: Insufficient documentation

## 2015-12-21 DIAGNOSIS — F259 Schizoaffective disorder, unspecified: Secondary | ICD-10-CM | POA: Insufficient documentation

## 2015-12-21 LAB — TSH: TSH: 0.561 u[IU]/mL (ref 0.350–4.500)

## 2015-12-21 LAB — LIPID PANEL
CHOL/HDL RATIO: 4.9 ratio
CHOLESTEROL: 153 mg/dL (ref 0–200)
HDL: 31 mg/dL — ABNORMAL LOW (ref >40)
LDL Cholesterol: 98 mg/dL (ref 0–99)
Triglycerides: 122 mg/dL (ref <150)
VLDL: 24 mg/dL (ref 0–40)

## 2015-12-21 MED ORDER — NICOTINE 21 MG/24HR TD PT24
21.0000 mg | MEDICATED_PATCH | Freq: Every day | TRANSDERMAL | Status: DC
  Administered 2015-12-21 – 2015-12-22 (×2): 21 mg via TRANSDERMAL
  Filled 2015-12-21 (×4): qty 1

## 2015-12-21 MED ORDER — ADULT MULTIVITAMIN W/MINERALS CH
1.0000 | ORAL_TABLET | Freq: Every day | ORAL | Status: DC
  Administered 2015-12-21 – 2015-12-25 (×5): 1 via ORAL
  Filled 2015-12-21 (×8): qty 1

## 2015-12-21 MED ORDER — OLANZAPINE 5 MG PO TABS
5.0000 mg | ORAL_TABLET | Freq: Every day | ORAL | Status: DC
  Filled 2015-12-21: qty 1

## 2015-12-21 MED ORDER — HYDROXYZINE HCL 25 MG PO TABS: 25.0000 mg | ORAL_TABLET | Freq: Three times a day (TID) | ORAL | Status: DC | PRN

## 2015-12-21 MED ORDER — DICYCLOMINE HCL 20 MG PO TABS: 20.0000 mg | ORAL_TABLET | Freq: Four times a day (QID) | ORAL | Status: DC | PRN

## 2015-12-21 MED ORDER — TRAZODONE HCL 50 MG PO TABS
50.0000 mg | ORAL_TABLET | Freq: Every evening | ORAL | Status: DC | PRN
  Administered 2015-12-21 – 2015-12-24 (×4): 50 mg via ORAL
  Filled 2015-12-21: qty 7
  Filled 2015-12-21 (×4): qty 1

## 2015-12-21 MED ORDER — METHOCARBAMOL 500 MG PO TABS
500.0000 mg | ORAL_TABLET | Freq: Three times a day (TID) | ORAL | Status: DC | PRN
  Administered 2015-12-21 – 2015-12-22 (×3): 500 mg via ORAL
  Filled 2015-12-21 (×3): qty 1

## 2015-12-21 MED ORDER — FLUOXETINE HCL 20 MG PO CAPS
20.0000 mg | ORAL_CAPSULE | Freq: Every day | ORAL | Status: DC
  Administered 2015-12-22 – 2015-12-25 (×4): 20 mg via ORAL
  Filled 2015-12-21 (×6): qty 1

## 2015-12-21 MED ORDER — RISPERIDONE 2 MG PO TABS
2.0000 mg | ORAL_TABLET | Freq: Every day | ORAL | Status: DC
  Administered 2015-12-21 – 2015-12-24 (×4): 2 mg via ORAL
  Filled 2015-12-21 (×7): qty 1

## 2015-12-21 MED ORDER — LOPERAMIDE HCL 2 MG PO CAPS: 2.0000 mg | ORAL_CAPSULE | ORAL | Status: DC | PRN

## 2015-12-21 MED ORDER — ONDANSETRON 4 MG PO TBDP: 4.0000 mg | ORAL_TABLET | Freq: Four times a day (QID) | ORAL | Status: DC | PRN

## 2015-12-21 MED ORDER — INFLUENZA VAC SPLIT QUAD 0.5 ML IM SUSY
0.5000 mL | PREFILLED_SYRINGE | INTRAMUSCULAR | Status: AC
  Administered 2015-12-21: 0.5 mL via INTRAMUSCULAR
  Filled 2015-12-21: qty 0.5

## 2015-12-21 MED ORDER — MAGNESIUM HYDROXIDE 400 MG/5ML PO SUSP: 30.0000 mL | Freq: Every day | ORAL | Status: DC | PRN

## 2015-12-21 MED ORDER — ACETAMINOPHEN 325 MG PO TABS
650.0000 mg | ORAL_TABLET | Freq: Four times a day (QID) | ORAL | Status: DC | PRN
  Administered 2015-12-24: 650 mg via ORAL
  Filled 2015-12-21: qty 2

## 2015-12-21 MED ORDER — ALUM & MAG HYDROXIDE-SIMETH 200-200-20 MG/5ML PO SUSP: 30.0000 mL | ORAL | Status: DC | PRN

## 2015-12-21 MED ORDER — NAPROXEN 500 MG PO TABS: 500.0000 mg | ORAL_TABLET | Freq: Two times a day (BID) | ORAL | Status: DC | PRN

## 2015-12-21 MED ORDER — LORAZEPAM 0.5 MG PO TABS
0.5000 mg | ORAL_TABLET | Freq: Four times a day (QID) | ORAL | Status: DC | PRN
  Administered 2015-12-21 – 2015-12-22 (×3): 0.5 mg via ORAL
  Filled 2015-12-21 (×3): qty 1

## 2015-12-21 MED ORDER — FLUOXETINE HCL 10 MG PO CAPS
10.0000 mg | ORAL_CAPSULE | Freq: Every day | ORAL | Status: DC
  Filled 2015-12-21 (×2): qty 1

## 2015-12-21 MED ORDER — CITALOPRAM HYDROBROMIDE 10 MG PO TABS: 10.0000 mg | ORAL_TABLET | Freq: Every day | ORAL | Status: DC

## 2015-12-21 NOTE — Progress Notes (Signed)
D   Pt is pleasant on approach and cooperative   He appears to be preoccupied or responding to internal stimulie did not attend group this evening    He is compliant with treatment  A    Verbal support given   Medications administered and effectiveness monitored   Q 15 min checks R   Pt is safe at present time

## 2015-12-21 NOTE — H&P (Signed)
Psychiatric Admission Assessment Adult  Patient Identification: Michael Costa MRN:  [id number] Date of Evaluation:  12/21/2015 Chief Complaint:  MDD RECURRENT SEVERE WITH PSYCHOTIC FEATURES Principal Diagnosis: Major depressive disorder, recurrent episode, severe ([place]) Diagnosis:   Patient Active Problem List   Diagnosis Date Noted  . Major depressive disorder, recurrent episode, severe ([place]) [F33.2] 12/21/2015    Priority: High  . MDD (major depressive disorder), recurrent, severe, with psychosis ([place]) [F33.3] 04/07/2015    Priority: High  . Opioid use disorder, moderate, dependence ([place]) [F11.20] 03/05/2015  . Alcohol use disorder, mild, abuse [F10.10] 03/05/2015  . Moderate benzodiazepine use disorder ([place]) [F13.20] 03/05/2015  . Tobacco use disorder [F17.200] 03/05/2015   History of Present Illness: Michael Costa is an 25 y.o. single male who presents unaccompanied to [facility] ED reporting symptoms of depression and anxiety. Pt has a history of depression, anxiety and substance abuse and states he has not taken his medications consistently and not followed up with [facility] for outpatient.  He states that he has been taking his meds on a regular basis up until 2 weeks ago.  Patient is unable to recall which meds he is taking.  He states that he stopped because he "felt better."  He states that he does not believe he is on the right meds as his depression has worsened over the past several months.   Per chart review at time of admission, Michael Costa reported that he got angry and threatened to break the window of his grandmother's car following an argument.  He reported suicidal ideation with no plan or intent. Patient reported seeing shadows of people and hearing voices of people there are "mind control drones."   Patient has a history of abusing alcohol and various opiate pain medications but denies use in approximately one month.  Patient reports he was psychiatrically hospitalized at [facility] approximately one month ago due to depression, anxiety and substance abuse. does not have an outpatient provider because he did not follow up with [facility]. Pt was hospitalized at [facility] 05/06/15-05/15/15.  Associated Signs/Symptoms: Depression Symptoms:  depressed mood, anhedonia, anxiety, (Hypo) Manic Symptoms:  Labiality of Mood, Anxiety Symptoms:  Excessive Worry, Psychotic Symptoms:  Hallucinations: Auditory PTSD Symptoms: NA Total Time spent with patient: 45 minutes  Past Psychiatric History: see HPI  Is the patient at risk to self? Yes.    Has the patient been a risk to self in the past 6 months? Yes.    Has the patient been a risk to self within the distant past? Yes.    Is the patient a risk to others? No.  Has the patient been a risk to others in the past 6 months? No.  Has the patient been a risk to others within the distant past? No.   Prior Inpatient Therapy:   Prior Outpatient Therapy:    Alcohol Screening: 1. How often do you have a drink containing alcohol?: 2 to 4 times a month 2. How many drinks containing alcohol do you have on a typical day when you are drinking?: 5 or 6 3. How often do you have six or more drinks on one occasion?: Never Preliminary Score: 2 4. How often during the last year have you found that you were not able to stop drinking once you had started?: Less than monthly 5. How often during the last year have you failed to do what was normally expected from you becasue of drinking?: Never 6. How often during the last year have  you needed a first drink in the morning to get yourself going after a heavy drinking session?: Never 7. How often during the last year have you had a feeling of guilt of remorse after drinking?: Never 8. How often during the last year have you been unable to remember what happened the night before because you had been drinking?: Never 9. Have you or someone else been injured as a result of your drinking?: No 10.  Has a relative or friend or a doctor or another health worker been concerned about your drinking or suggested you cut down?: No Alcohol Use Disorder Identification Test Final Score (AUDIT): 5 Brief Intervention: AUDIT score less than 7 or less-screening does not suggest unhealthy drinking-brief intervention not indicated Substance Abuse History in the last 12 months:  Yes.   Consequences of Substance Abuse:  Crisis stabilization Previous Psychotropic Medications: Yes  Psychological Evaluations: No  Past Medical History:  Past Medical History:  Diagnosis Date  . Medical history non-contributory   . Polysubstance abuse    opiates, benzos, marijuana    Past Surgical History:  Procedure Laterality Date  . NO PAST SURGERIES     Family History:  Family History  Problem Relation Age of Onset  . Drug abuse Other   . Mental illness Neg Hx    Family Psychiatric  History: see HPI Tobacco Screening: Have you used any form of tobacco in the last 30 days? (Cigarettes, Smokeless Tobacco, Cigars, and/or Pipes): Yes Tobacco use, Select all that apply: 5 or more cigarettes per day Are you interested in Tobacco Cessation Medications?: Yes, will notify MD for an order Counseled patient on smoking cessation including recognizing danger situations, developing coping skills and basic information about quitting provided: Refused/Declined practical counseling Social History:  History  Alcohol Use  . 3.0 oz/week  . 5 Cans of beer per week    Comment: occasional     History  Drug Use  . Types: Marijuana, Hydrocodone, Oxycodone, Benzodiazepines    Comment: pain pills, opiates, benzos    Additional Social History:      Pain Medications: History of abusing pain medications Prescriptions: hydrocodone, oxycodone Over the Counter: None reported History of alcohol / drug use?: Yes Negative Consequences of Use: Work / Youth worker, Personal relationships Name of Substance 1: pain pills 1 - Age of First Use:  20 1 - Frequency: daily for "awhile". Now 2 to 3 pills weekly 1 - Duration: on going 1 - Last Use / Amount: 3 days Name of Substance 2: alcohol 2 - Age of First Use: 25 years of age 75 - Frequency: "Twice a month" 2 - Duration: on going 2 - Last Use / Amount: 1 week ago Name of Substance 3: Xanax 3 - Age of First Use: 25 yrs old 3 - Amount (size/oz): 18m 3 - Frequency: "a couple times a month" 3 - Last Use / Amount: 3 days ago    Allergies:   Allergies  Allergen Reactions  . Adhesive [Tape]     sensitivity   Lab Results:  Results for orders placed or performed during the hospital encounter of 12/20/15 (from the past 48 hour(s))  TSH     Status: None   Collection Time: 12/21/15  6:08 AM  Result Value Ref Range   TSH 0.561 0.350 - 4.500 uIU/mL    Comment: Performed by a 3rd Generation assay with a functional sensitivity of <=0.01 uIU/mL. [facility]  Lipid panel  Status: Abnormal   Collection Time: 12/21/15  6:08 AM  Result Value Ref Range   Cholesterol 153 0 - 200 mg/dL   Triglycerides 122 <150 mg/dL   HDL 31 (L) >40 mg/dL   Total CHOL/HDL Ratio 4.9 RATIO   VLDL 24 0 - 40 mg/dL   LDL Cholesterol 98 0 - 99 mg/dL    Comment:        Total Cholesterol/HDL:CHD Risk Coronary Heart Disease Risk Table                     Men   Women  1/2 Average Risk   3.4   3.3  Average Risk       5.0   4.4  2 X Average Risk   9.6   7.1  3 X Average Risk  23.4   11.0        Use the calculated Patient Ratio above and the CHD Risk Table to determine the patient's CHD Risk.        ATP III CLASSIFICATION (LDL):  <100     mg/dL   Optimal  100-129  mg/dL   Near or Above                    Optimal  130-159  mg/dL   Borderline  160-189  mg/dL   High  >190     mg/dL   Very High Performed at [facility]     Blood Alcohol level:  Lab Results  Component Value Date   ETH 5 (H) 12/20/2015   ETH <5 42/70/6237    Metabolic Disorder Labs:  Lab  Results  Component Value Date   HGBA1C 5.3 04/09/2015   MPG 105 04/09/2015   Lab Results  Component Value Date   PROLACTIN 46.1 (H) 05/08/2015   Lab Results  Component Value Date   CHOL 153 12/21/2015   TRIG 122 12/21/2015   HDL 31 (L) 12/21/2015   CHOLHDL 4.9 12/21/2015   VLDL 24 12/21/2015   LDLCALC 98 12/21/2015   LDLCALC 85 04/09/2015    Current Medications: Current Facility-Administered Medications  Medication Dose Route Frequency Provider Last Rate Last Dose  . acetaminophen (TYLENOL) tablet 650 mg  650 mg Oral Q6H PRN Rozetta Nunnery, NP      . alum & mag hydroxide-simeth (MAALOX/MYLANTA) 200-200-20 MG/5ML suspension 30 mL  30 mL Oral Q4H PRN Rozetta Nunnery, NP      . dicyclomine (BENTYL) tablet 20 mg  20 mg Oral Q6H PRN Rozetta Nunnery, NP      . hydrOXYzine (ATARAX/VISTARIL) tablet 25 mg  25 mg Oral TID PRN Rozetta Nunnery, NP      . loperamide (IMODIUM) capsule 2-4 mg  2-4 mg Oral PRN Rozetta Nunnery, NP      . magnesium hydroxide (MILK OF MAGNESIA) suspension 30 mL  30 mL Oral Daily PRN Rozetta Nunnery, NP      . methocarbamol (ROBAXIN) tablet 500 mg  500 mg Oral Q8H PRN Rozetta Nunnery, NP      . naproxen (NAPROSYN) tablet 500 mg  500 mg Oral BID PRN Rozetta Nunnery, NP      . nicotine (NICODERM CQ - dosed in mg/24 hours) patch 21 mg  21 mg Transdermal Daily Rozetta Nunnery, NP   21 mg at 12/21/15 1146  . ondansetron (ZOFRAN-ODT) disintegrating tablet 4 mg  4 mg Oral Q6H PRN Rozetta Nunnery, NP      .  traZODone (DESYREL) tablet 50 mg  50 mg Oral QHS PRN Rozetta Nunnery, NP       PTA Medications: Prescriptions Prior to Admission  Medication Sig Dispense Refill Last Dose  . traZODone (DESYREL) 50 MG tablet Take 1 tablet (50 mg total) by mouth at bedtime as needed for sleep. 30 tablet 0 Past Week at Unknown time  . benztropine (COGENTIN) 0.5 MG tablet Take 1 tablet (0.5 mg total) by mouth at bedtime. For prevention of drug induced tremors 30 tablet 0 More than a month at Unknown time  .  FLUoxetine (PROZAC) 20 MG capsule Take 3 capsules (60 mg total) by mouth daily. For depression 90 capsule 0 More than a month at Unknown time  . hydrOXYzine (ATARAX/VISTARIL) 50 MG tablet Take 1 tablet (50 mg total) by mouth every 6 (six) hours as needed for anxiety. 60 tablet 0 More than a month at Unknown time  . nicotine (NICODERM CQ - DOSED IN MG/24 HOURS) 21 mg/24hr patch Place 1 patch (21 mg total) onto the skin daily. For nicotine addiction 28 patch 0 Past Month at Unknown time  . OLANZapine (ZYPREXA) 5 MG tablet Take 5 tablets (25 mg total) by mouth at bedtime. For mood control 150 tablet 0 More than a month at Unknown time    Musculoskeletal: Strength & Muscle Tone: within normal limits Gait & Station: normal Patient leans: N/A  Psychiatric Specialty Exam: Physical Exam  Nursing note and vitals reviewed.   ROS  Blood pressure 110/60, pulse 95, temperature 98 F (36.7 C), temperature source Oral, resp. rate 20, height _0  (1.676 m), weight 67.1 kg (148 lb), SpO2 99 %.Body mass index is 23.89 kg/m.  General Appearance: Disheveled  Eye Contact:  Good  Speech:  Clear and Coherent  Volume:  Normal  Mood:  Anxious  Affect:  Constricted  Thought Process:  Coherent  Orientation:  Full (Time, Place, and Person)  Thought Content:  Hallucinations: Auditory upon admission  Suicidal Thoughts:  No  Homicidal Thoughts:  No  Memory:  Immediate;   Fair Recent;   Fair Remote;   Poor  Judgement:  Poor  Insight:  Lacking  Psychomotor Activity:  Normal  Concentration:  Concentration: Poor and Attention Span: Poor  Recall:  Poor  Fund of Knowledge:  Fair  Language:  Fair  Akathisia:  Negative  Handed:  Right  AIMS (if indicated):     Assets:  Social Support  ADL's:  Intact  Cognition:  WNL and Impaired,  Mild  Sleep:  Number of Hours: 4.5   Treatment Plan Summary: Admit for crisis management and mood stabilization. Medication management to re-stabilize current mood  symptoms Group counseling sessions for coping skills Medical consults as needed Review and reinstate any pertinent home medications for other health problems  Observation Level/Precautions:  15 minute checks  Laboratory:  See lab results  Psychotherapy:  group  Medications:  Prozac 10 mg daily depression, Zyprexa 5 mg QHS mood stabilization  Consultations:  As needed  Discharge Concerns:  safety  Estimated LOS:  2-7 days  Other:     Physician Treatment Plan for Primary Diagnosis: Major depressive disorder, recurrent episode, severe ([place]) Long Term Goal(s): Improvement in symptoms so as ready for discharge  Short Term Goals: Ability to identify changes in lifestyle to reduce recurrence of condition will improve, Ability to verbalize feelings will improve, Ability to disclose and discuss suicidal ideas, Ability to demonstrate self-control will improve, Ability to identify and develop effective coping behaviors  will improve, Ability to maintain clinical measurements within normal limits will improve, Compliance with prescribed medications will improve and Ability to identify triggers associated with substance abuse/mental health issues will improve  Physician Treatment Plan for Secondary Diagnosis: Principal Problem:   Major depressive disorder, recurrent episode, severe ([place])  Long Term Goal(s): Improvement in symptoms so as ready for discharge  Short Term Goals: Ability to identify changes in lifestyle to reduce recurrence of condition will improve, Ability to verbalize feelings will improve, Ability to disclose and discuss suicidal ideas, Ability to demonstrate self-control will improve, Ability to identify and develop effective coping behaviors will improve, Ability to maintain clinical measurements within normal limits will improve, Compliance with prescribed medications will improve and Ability to identify triggers associated with substance abuse/mental health issues will improve  I  certify that inpatient services furnished can reasonably be expected to improve the patient's condition.    Janett Labella, NP [facility] 11/18/20171:35 PM  I have discussed case with NP and have met with patient  Agree with NP note and assessment  Patient is a 25 year old male- single, currently unemployed, lives with grandmother. He is known to our unit from prior psychiatric admissions, most recently in April 2017. Patient states he has been hospitalized twice at other psychiatric inpatient units since then. He describes chronic psychotic symptoms- reports he feels that there are government controlled drones that somehow emit a red light, which sometimes makes his chest hurt . In addition to psychotic symptoms, he also reports a sense of depression, sadness, and passive SI. He is preoccupied with these ideations, and states that he feels  depressed,frustrated , angry at times, and has become more reclusive , isolated at home because of them. States that he sometimes has suicidal ideations," would rather be dead than deal with this", but denies any actual plan or intention of hurting self .  He has a history of substance dependence, mainly to opiates , cannabis ( in the past ), and also has a past history of abusing methamphetamine. He states that his substance abuse has " slowed down a whole lot", but he still abuses opiate analgesics at times . Of note, admission BAL negative, and admission UDS negative as well . In the past has been managed with Prozac and Zyprexa , up to 60 mgrs daily and 25 mgrs QHS . States he does not take every day but does take " more days than not". He states symptoms persist in spite of medications. Dx-Schizoaffective Disorder , consider also Paranoid Schizophrenia ( of note > 6 months of symptoms) , history of polysubstance dependence Plan- inpatient admission- we discussed options- since symptoms have persisted in spite of Zyprexa,consider switching to another  antipsychotic . Agrees to Risperidone trial, and may benefit from starting /switching to a depot IM antipsychotic prior to discharge, to address sub-optimal PO medication compliance .  Continue Prozac at this time. Not presenting with  significant opiate WDL symptoms, and admission UDS negative for opiates, so will not start Opiate detox protocol at this time.

## 2015-12-21 NOTE — Progress Notes (Signed)
Data. Patient denies SI/HI/AVH.   Patient interacting well with staff and other patients.  Action. Emotional support and encouragement offered. Education provided on medication, indications and side effect. Q 15 minute checks done for safety. Response. Safety on the unit maintained through 15 minute checks.  Medications taken as prescribed. Attended groups. Remained calm and appropriate through out shift. 

## 2015-12-21 NOTE — [facility] Suicide Risk Assessment (Addendum)
[facility]   Nursing information obtained from:  Patient Demographic factors:  Male, Caucasian, Unemployed, Low socioeconomic status Current Mental Status:  NA Loss Factors:   ("get my mind right") Historical Factors:  Impulsivity Risk Reduction Factors:  Sense of responsibility to family, Living with another person, especially a relative  Total Time spent with patient: 45 minutes Principal Problem: Major depressive disorder, recurrent episode, severe ([facility]) Diagnosis:   Patient Active Problem List   Diagnosis Date Noted  . Major depressive disorder, recurrent episode, severe ([facility]) [F33.2] 12/21/2015  . MDD (major depressive disorder), recurrent, severe, with psychosis ([facility]) [F33.3] 04/07/2015  . 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 Alcohol use disorder, mild, abuse [F10.10] 03/05/2015  . 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 Tobacco use disorder [F17.200] 03/05/2015     Continued Clinical Symptoms:  Alcohol Use Disorder Identification Test Final Score (AUDIT): 5 The "Alcohol Use Disorders Identification Test", Guidelines for Use in Primary Care, Second Edition.  World Science writerHealth Organization [facility](WHO). Score between 0-7:  no or low risk or alcohol related problems. Score between 8-15:  moderate risk of alcohol related problems. Score between 16-19:  high risk of alcohol related problems. Score 20 or above:  warrants further diagnostic evaluation for alcohol dependence and treatment.   CLINICAL FACTORS:  Patient is a 25 year old male- single, currently unemployed, lives with grandmother. He is known to our unit from prior psychiatric admissions, most recently in April 2017. Patient states he has been hospitalized twice at other psychiatric inpatient units since then. He describes chronic psychotic symptoms- reports he feels that there are government controlled drones that somehow emit a red light,  which sometimes makes his chest hurt . In addition to psychotic symptoms, he also reports a sense of depression, sadness, and passive SI. He is preoccupied with these ideations, and states that he feels  depressed,frustrated , angry at times, and has become more reclusive , isolated at home because of them. States that he sometimes has suicidal ideations," would rather be dead than deal with this", but denies any actual plan or intention of hurting self .  He has a history of substance dependence, mainly to opiates , cannabis ( in the past ), and also has a past history of abusing methamphetamine. He states that his substance abuse has " slowed down a whole lot", but he still abuses opiate analgesics at times . Of note, admission BAL negative, and admission UDS negative as well . In the past has been managed with Prozac and Zyprexa , up to 60 mgrs daily and 25 mgrs QHS . States he does not take every day but does take " more days than not". He states symptoms persist in spite of medications. Dx-Schizoaffective Disorder , consider also Paranoid Schizophrenia ( of note > 6 months of symptoms) , history of polysubstance dependence Plan- inpatient admission- we discussed options- since symptoms have persisted in spite of Zyprexa,consider switching to another antipsychotic . Agrees to Risperidone trial, and may benefit from starting /switching to a depot IM antipsychotic prior to discharge, to address sub-optimal PO medication compliance .  Continue Prozac at this time. Not presenting with  significant opiate WDL symptoms, and admission UDS negative for opiates, so will not start Opiate detox protocol at this time.    Musculoskeletal: Strength & Muscle Tone: within normal limits  No tremors, no diaphoresis, no psychomotor agitation  Gait & Station: normal Patient leans: N/A  Psychiatric Specialty  Exam: Physical Exam  ROS denies chest pain, no shortness of breath, no vomiting, no rash  Blood pressure  110/60, pulse 95, temperature 98 F (36.7 C), temperature source Oral, resp. rate 20, height 5\' 6"  (1.676 m), weight 148 lb (67.1 kg), SpO2 99 %.Body mass index is 23.89 kg/m.  General Appearance: Fairly Groomed  Eye Contact:  Good  Speech:  Normal Rate  Volume:  Normal  Mood:  depressed, anxious   Affect:  appropriate, congruent  Thought Process:  Linear  Orientation:  Other:  fully aert and attentive   Thought Content: (+) auditory and visual hallucinations, (+) persecutory delusions, at this time does not appear internally preoccupied   Suicidal Thoughts:  No currently denies suicidal plan or intention, and denies any homicidal or violent ideations , contracts for safety on unit   Homicidal Thoughts:  No  Memory:  recent and remote grossly intact   Judgement:  Fair  Insight:  Lacking  Psychomotor Activity:  Normal no tremors , no psychomotor restlessness   Concentration:  Concentration: Good and Attention Span: Good  Recall:  Good  Fund of Knowledge:  Good  Language:  Good  Akathisia:  Negative  Handed:  Right  AIMS (if indicated):     Assets:  Communication Skills Desire for Improvement Resilience  ADL's:  Intact  Cognition:  WNL  Sleep:  Number of Hours: 4.5      COGNITIVE FEATURES THAT CONTRIBUTE TO RISK:  Closed-mindedness and Loss of executive function    SUICIDE RISK:   Moderate:  Frequent suicidal ideation with limited intensity, and duration, some specificity in terms of plans, no associated intent, good self-control, limited dysphoria/symptomatology, some risk factors present, and identifiable protective factors, including available and accessible social support.   PLAN OF CARE: Patient will be admitted to inpatient psychiatric unit for stabilization and safety. Will provide and encourage milieu participation. Provide medication management and maked adjustments as needed.  Will follow daily.    I certify that inpatient services furnished can reasonably be  expected to improve the patient's condition.  Nehemiah MassedOBOS, FERNANDO, MD 12/21/2015, 2:34 PM

## 2015-12-21 NOTE — Progress Notes (Signed)
  Pt was vague with his responses to the questions. Several times during the assessment pt changed his answers. In report from hosp, Clinical research associatewriter was encouraged to "look out for pt because he may be up to something".  When asked the reasons for adm pt stated, "get my mind right and start focusing ahead instead of looking in the past". Report states [facility] one [facility] ago. Per report pt has charges pending for breaking the windows out of his grandmother's car. However, pt denied.  Pt denied SI, HI, and A/V.

## 2015-12-21 NOTE — Tx Team (Signed)
Initial Treatment Plan 12/21/2015 12:12 AM Michael Costa [id number]    PATIENT STRESSORS: Financial difficulties Legal issue Substance abuse   PATIENT STRENGTHS: Average or above average intelligence General fund of knowledge Supportive family/friends   PATIENT IDENTIFIED PROBLEMS: "To kinda find myself and stop worrying so much"  "Find some medicine"    Increased risk for suicide  Substance abuse             DISCHARGE CRITERIA:  Ability to meet basic life and health needs Adequate post-discharge living arrangements Improved stabilization in mood, thinking, and/or behavior Medical problems require only outpatient monitoring Motivation to continue treatment in a less acute level of care Need for constant or close observation no longer present Reduction of life-threatening or endangering symptoms to within safe limits Safe-care adequate arrangements made Verbal commitment to aftercare and medication compliance Withdrawal symptoms are absent or subacute and managed without 24-hour nursing intervention  PRELIMINARY DISCHARGE PLAN: Attend 12-step recovery group Outpatient therapy Participate in family therapy Return to previous living arrangement  PATIENT/FAMILY INVOLVEMENT: This treatment plan has been presented to and reviewed with the patient, Michael ClayJustin Costa, and/or family member.  The patient and family have been given the opportunity to ask questions and make suggestions.  Doristine JohnsBrooks, Hartman Minahan Laverne, RN 12/21/2015, 12:12 AM

## 2015-12-21 NOTE — [facility] Group Notes (Signed)
[facility] Group Notes:  (Clinical Social Work)  12/21/2015  11:15-12:00PM  Summary of Progress/Problems:   Today's process group involved patients discussing their feelings related to being hospitalized, as well as what they plan to do in order to remain out of the hospital.  They were able to choose quotes that they then shared with the group and talked about how they could utilize that quote. The patient expressed a primary feeling about being hospitalized is "good" because he is "angry all the time and need to control that."  He will be 25yo on Monday and said he needs to gain control over himself.  He said a stress reliever for him is working, staying busy.  Type of Therapy:  Group Therapy - Process  Participation Level:  Active  Participation Quality:  Attentive and Sharing  Affect:  Blunted  Cognitive:  Appropriate  Insight:  Improving  Engagement in Therapy:  Improving  Modes of Intervention:  Exploration, Discussion  Ambrose MantleMareida Grossman-Orr, LCSW 12/21/2015, 12:15 PM

## 2015-12-22 DIAGNOSIS — Z813 Family history of other psychoactive substance abuse and dependence: Secondary | ICD-10-CM

## 2015-12-22 DIAGNOSIS — Z79899 Other long term (current) drug therapy: Secondary | ICD-10-CM

## 2015-12-22 DIAGNOSIS — Z818 Family history of other mental and behavioral disorders: Secondary | ICD-10-CM

## 2015-12-22 DIAGNOSIS — F17210 Nicotine dependence, cigarettes, uncomplicated: Secondary | ICD-10-CM

## 2015-12-22 DIAGNOSIS — F333 Major depressive disorder, recurrent, severe with psychotic symptoms: Secondary | ICD-10-CM

## 2015-12-22 LAB — HEMOGLOBIN A1C
Hgb A1c MFr Bld: 5.2 % (ref 4.8–5.6)
Mean Plasma Glucose: 103 mg/dL

## 2015-12-22 MED ORDER — NICOTINE POLACRILEX 2 MG MT GUM
2.0000 mg | CHEWING_GUM | OROMUCOSAL | Status: DC | PRN
  Administered 2015-12-22 – 2015-12-24 (×3): 2 mg via ORAL
  Filled 2015-12-22: qty 1

## 2015-12-22 MED ORDER — NICOTINE POLACRILEX 2 MG MT GUM
CHEWING_GUM | OROMUCOSAL | Status: AC
  Administered 2015-12-22: 17:00:00
  Filled 2015-12-22: qty 1

## 2015-12-22 NOTE — [facility] Group Notes (Signed)
[facility] Group Notes: (Clinical Social Work)   12/22/2015      Type of Therapy:  Group Therapy   Participation Level:  Did Not Attend despite MHT prompting   Ambrose MantleMareida Grossman-Orr, LCSW 12/22/2015, 12:09 PM

## 2015-12-22 NOTE — Progress Notes (Signed)
D   Pt is pleasant on approach and cooperative   He appears to be preoccupied or responding to internal stimulie did not attend group this evening    He is compliant with treatment   Pt is observed out on the milieu but has almost no interaction with staff and peers  A    Verbal support given   Medications administered and effectiveness monitored   Q 15 min checks R   Pt is safe at present time

## 2015-12-22 NOTE — [facility] Group Notes (Signed)
[facility] Group Notes:  (Nursing/MHT/Case Management/Adjunct)  Date:  12/22/2015  Time:  9:21 AM  Type of Therapy:  Psychoeducational Skills  Participation Level:  Did Not Attend  Participation Quality:  Did Not Attend  Affect:  Did Not Attend  Cognitive:  Did Not Attend  Insight:  None  Engagement in Group:  Did Not Attend  Modes of Intervention:  Did Not Attend  Summary of Progress/Problems: Pt did not attend patient self inventory group.   Michael Costa 12/22/2015, 9:21 AM 
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
## 2015-12-22 NOTE — Progress Notes (Signed)
[facility]  12/22/2015 3:24 PM Michael ClayJustin Costa  MRN:  [id number] Subjective:  Patient reports " I am feeling okay, I just want these voices to stop."  Objective: Michael Costa is awake, alert and oriented *3, seen resting in bedroom.  Denies suicidal or homicidal ideation. Patient reports auditory hallucination, patient denies that the voice are command in nature.  denies visual hallucination and does not appear to be responding to internal stimuli. Patient interacts well with staff and others. Patient reports he is medication compliant without mediation side effects. Patient denies depression or depressive symptoms. Reports good appetite and reports he rested well last night. Support, encouragement and reassurance was provided.   Principal Problem: Schizoaffective disorder (HCC) Diagnosis:   Patient Active Problem List   Diagnosis Date Noted  . Major depressive disorder, recurrent episode, severe (HCC) [F33.2] 12/21/2015  . Schizoaffective disorder (HCC) [F25.9] 12/21/2015  . MDD (major depressive disorder), recurrent, severe, with psychosis (HCC) [F33.3] 04/07/2015  . 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 Alcohol use disorder, mild, abuse [F10.10] 03/05/2015  . 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 Tobacco use disorder [F17.200] 03/05/2015   Total Time spent with patient: 30 minutes  Past Psychiatric History:  Past Medical History:  Past Medical History:  Diagnosis Date  . Medical history non-contributory   . Polysubstance abuse    opiates, benzos, marijuana    Past Surgical History:  Procedure Laterality Date  . NO PAST SURGERIES     Family History:  Family History  Problem Relation Age of Onset  . Drug abuse Other   . Mental illness Neg Hx    Family Psychiatric  History:  Social History:  History  Alcohol Use  . 3.0 oz/week  . 5 Cans of beer per week    Comment: occasional     History  Drug Use   . Types: Marijuana, Hydrocodone, Oxycodone, Benzodiazepines    Comment: pain pills, opiates, benzos    Social History   Social History  . Marital status: Single    Spouse name: N/A  . Number of children: N/A  . Years of education: N/A   Social History Main Topics  . Smoking status: Current Every Day Smoker    Packs/day: 2.00    Years: 7.00    Types: Cigarettes  . Smokeless tobacco: Never Used  . Alcohol use 3.0 oz/week    5 Cans of beer per week     Comment: occasional  . Drug use:     Types: Marijuana, Hydrocodone, Oxycodone, Benzodiazepines     Comment: pain pills, opiates, benzos  . Sexual activity: Not Currently    Birth control/ protection: None   Other Topics Concern  . None   Social History Narrative  . None   Additional Social History:    Pain Medications: History of abusing pain medications Prescriptions: hydrocodone, oxycodone Over the Counter: None reported History of alcohol / drug use?: Yes Negative Consequences of Use: Work / Programmer, multimediachool, Personal relationships Name of Substance 1: pain pills 1 - Age of First Use: 20 1 - Frequency: daily for "awhile". Now 2 to 3 pills weekly 1 - Duration: on going 1 - Last Use / Amount: 3 days Name of Substance 2: alcohol 2 - Age of First Use: 25 years of age 90 - Frequency: "Twice a month" 2 - Duration: on going 2 - Last Use / Amount: 1 week ago Name of Substance 3: Xanax 3 - Age of First  Use: 25 yrs old 3 - Amount (size/oz): 1mg  3 - Frequency: "a couple times a month" 3 - Last Use / Amount: 3 days ago              Sleep: Fair  Appetite:  Fair  Current Medications: Current Facility-Administered Medications  Medication Dose Route Frequency Provider Last Rate Last Dose  . acetaminophen (TYLENOL) tablet 650 mg  650 mg Oral Q6H PRN Jackelyn PolingJason A Berry, NP      . alum & mag hydroxide-simeth (MAALOX/MYLANTA) 200-200-20 MG/5ML suspension 30 mL  30 mL Oral Q4H PRN Jackelyn PolingJason A Berry, NP      . dicyclomine (BENTYL) tablet  20 mg  20 mg Oral Q6H PRN Jackelyn PolingJason A Berry, NP      . FLUoxetine (PROZAC) capsule 20 mg  20 mg Oral Daily Craige CottaFernando A Molleigh Huot, MD   20 mg at 12/22/15 1304  . loperamide (IMODIUM) capsule 2-4 mg  2-4 mg Oral PRN Jackelyn PolingJason A Berry, NP      . LORazepam (ATIVAN) tablet 0.5 mg  0.5 mg Oral Q6H PRN Craige CottaFernando A Camira Geidel, MD   0.5 mg at 12/22/15 1513  . magnesium hydroxide (MILK OF MAGNESIA) suspension 30 mL  30 mL Oral Daily PRN Jackelyn PolingJason A Berry, NP      . methocarbamol (ROBAXIN) tablet 500 mg  500 mg Oral Q8H PRN Jackelyn PolingJason A Berry, NP   500 mg at 12/21/15 2139  . multivitamin with minerals tablet 1 tablet  1 tablet Oral Daily Craige CottaFernando A Tishia Maestre, MD   1 tablet at 12/22/15 1304  . naproxen (NAPROSYN) tablet 500 mg  500 mg Oral BID PRN Jackelyn PolingJason A Berry, NP      . nicotine (NICODERM CQ - dosed in mg/24 hours) patch 21 mg  21 mg Transdermal Daily Jackelyn PolingJason A Berry, NP   21 mg at 12/22/15 1304  . ondansetron (ZOFRAN-ODT) disintegrating tablet 4 mg  4 mg Oral Q6H PRN Jackelyn PolingJason A Berry, NP      . risperiDONE (RISPERDAL) tablet 2 mg  2 mg Oral QHS Craige CottaFernando A Flossie Wexler, MD   2 mg at 12/21/15 2118  . traZODone (DESYREL) tablet 50 mg  50 mg Oral QHS PRN Jackelyn PolingJason A Berry, NP   50 mg at 12/21/15 2118    Lab Results:  Results for orders placed or performed during the hospital encounter of 12/20/15 (from the past 48 hour(s))  TSH     Status: None   Collection Time: 12/21/15  6:08 AM  Result Value Ref Range   TSH 0.561 0.350 - 4.500 uIU/mL    Comment: Performed by a 3rd Generation assay with a functional sensitivity of <=0.01 uIU/mL. Performed at [facility]   Lipid panel     Status: Abnormal   Collection Time: 12/21/15  6:08 AM  Result Value Ref Range   Cholesterol 153 0 - 200 mg/dL   Triglycerides 621122 <308<150 mg/dL   HDL 31 (L) >65>40 mg/dL   Total CHOL/HDL Ratio 4.9 RATIO   VLDL 24 0 - 40 mg/dL   LDL Cholesterol 98 0 - 99 mg/dL    Comment:        Total Cholesterol/HDL:CHD Risk Coronary Heart Disease Risk Table                      Men   Women  1/2 Average Risk   3.4   3.3  Average Risk       5.0   4.4  2  X Average Risk   9.6   7.1  3 X Average Risk  23.4   11.0        Use the calculated Patient Ratio above and the CHD Risk Table to determine the patient's CHD Risk.        ATP III CLASSIFICATION (LDL):  <100     mg/dL   Optimal  341-962  mg/dL   Near or Above                    Optimal  130-159  mg/dL   Borderline  229-798  mg/dL   High  >921     mg/dL   Very High Performed at [facility]   Hemoglobin A1c     Status: None   Collection Time: 12/21/15  6:08 AM  Result Value Ref Range   Hgb A1c MFr Bld 5.2 4.8 - 5.6 %    Comment: (NOTE)         Pre-diabetes: 5.7 - 6.4         Diabetes: >6.4         Glycemic control for adults with diabetes: <7.0    Mean Plasma Glucose 103 mg/dL    Comment: (NOTE) Performed At: [facility] [address] [place], [place] [id number] Mila Homer MD KG:[id number] Performed at [facility]     Blood Alcohol level:  Lab Results  Component Value Date   ETH 5 (H) 12/20/2015   ETH <5 05/04/2015    Metabolic Disorder Labs: Lab Results  Component Value Date   HGBA1C 5.2 12/21/2015   MPG 103 12/21/2015   MPG 105 04/09/2015   Lab Results  Component Value Date   PROLACTIN 46.1 (H) 05/08/2015   Lab Results  Component Value Date   CHOL 153 12/21/2015   TRIG 122 12/21/2015   HDL 31 (L) 12/21/2015   CHOLHDL 4.9 12/21/2015   VLDL 24 12/21/2015   LDLCALC 98 12/21/2015   LDLCALC 85 04/09/2015    Physical Findings: AIMS: Facial and Oral Movements Muscles of Facial Expression: None, normal Lips and Perioral Area: None, normal Jaw: None, normal Tongue: None, normal,Extremity Movements Upper (arms, wrists, hands, fingers): None, normal Lower (legs, knees, ankles, toes): None, normal, Trunk Movements Neck, shoulders, hips: None, normal, Overall Severity Severity of abnormal movements (highest score from questions above):  None, normal Incapacitation due to abnormal movements: None, normal Patient's awareness of abnormal movements (rate only patient's report): No Awareness, Dental Status Current problems with teeth and/or dentures?: Yes Does patient usually wear dentures?: No  CIWA:  CIWA-Ar Total: 0 COWS:  COWS Total Score: 0  Musculoskeletal: Strength & Muscle Tone: within normal limits Gait & Station: normal Patient leans: N/A  Psychiatric Specialty Exam: Physical Exam  Nursing note and vitals reviewed. Constitutional: He is oriented to person, place, and time. He appears well-developed.  Neurological: He is alert and oriented to person, place, and time.  Psychiatric: He has a normal mood and affect. His behavior is normal.    Review of Systems  Psychiatric/Behavioral: Positive for depression and hallucinations. The patient is nervous/anxious.     Blood pressure 111/79, pulse (!) 105, temperature 98.4 F (36.9 C), resp. rate 17, height 5\' 6"  (1.676 m), weight 67.1 kg (148 lb), SpO2 99 %.Body mass index is 23.89 kg/m.  General Appearance: Casual  Eye Contact:  Fair  Speech:  Clear and Coherent  Volume:  Normal  Mood:  Anxious and Depressed  Affect:  Appropriate  Thought Process:  Coherent  Orientation:  Full (Time, Place, and Person)  Thought Content:  Hallucinations: Auditory  Suicidal Thoughts:  No  Homicidal Thoughts:  No  Memory:  Immediate;   Fair Recent;   Fair Remote;   Fair  Judgement:  Fair  Insight:  Present  Psychomotor Activity:  Restlessness  Concentration:  Concentration: Fair  Recall:  [employer] of Knowledge:  Fair  Language:  Fair  Akathisia:  No  Handed:  Right  AIMS (if indicated):     Assets:  Communication Skills Resilience Social Support  ADL's:  Intact  Cognition:  WNL  Sleep:  Number of Hours: 6.25     I agree with current treatment plan on 12/22/2015, Patient seen face-to-face for psychiatric evaluation follow-up, chart reviewed. Reviewed the  information documented and agree with the treatment plan.  Treatment Plan Summary: Daily contact with patient to assess and evaluate symptoms and progress in treatment and Medication management  Continue with Prozac 20 mg, Risperdal mg for mood stabilization. Continue with Trazodone 50 mg for insomnia Will continue to monitor vitals ,medication compliance and treatment side effects while patient is here.  Reviewed labs CSW will start working on disposition.  Patient to participate in therapeutic milieu    Oneta Rack, NP 12/22/2015, 3:24 PM   Agree with NP Progress Note as above

## 2015-12-22 NOTE — Progress Notes (Signed)
D Tanya slept from 7a-1200, wen he got up, ate lunch and then he has been in and out of his room the remainder of the day. A He did complete his daily assessment and on it he wrote he has had si, he did contract to not hurt himslef and he rated his depression, hopelessness and anxeity " 4/4/5", respectively. R He cont to complain of " the voices" and is given prn  Ativan this afternoon 9 he asked for) and is scheduled to receive risperidal at [facility] tonight. Safety in place,.

## 2015-12-22 NOTE — Progress Notes (Signed)
Patient did attend the first half of the evening speaker AA meeting. Pt exited group and paced the hallway, checked his laundry, and came in and out of his room several times. Pt did not report a reason for leaving group but seems to be responding to internal stimuli.

## 2015-12-23 DIAGNOSIS — F259 Schizoaffective disorder, unspecified: Secondary | ICD-10-CM

## 2015-12-23 LAB — PROLACTIN: PROLACTIN: 8.6 ng/mL (ref 4.0–15.2)

## 2015-12-23 MED ORDER — HYDROXYZINE HCL 50 MG PO TABS
50.0000 mg | ORAL_TABLET | Freq: Four times a day (QID) | ORAL | Status: DC | PRN
  Administered 2015-12-24: 50 mg via ORAL
  Filled 2015-12-23: qty 1
  Filled 2015-12-23: qty 10

## 2015-12-23 NOTE — [facility] Group Notes (Signed)
Michael Costa was invited to group by facilitator.  He did not attend group.     spiritual care group on grief and loss facilitated by chaplain Michael Costa   Group opened with brief discussion and psycho-social ed around grief and loss in relationships and in relation to self - identifying life patterns, circumstances, changes that cause losses. Established group norm of speaking from own life experience. Group goal of establishing open and affirming space for members to share loss and experience with grief, normalize grief experience and provide psycho social education and grief support.

## 2015-12-23 NOTE — Progress Notes (Signed)
Recreation Therapy Notes  Date: 12/23/15 Time: 0930 Location: [address] Dayroom  Group Topic: Stress Management  Goal Area(s) Addresses:  Patient will verbalize importance of using healthy stress management.  Patient will identify positive emotions associated with healthy stress management.   Intervention: Calm App  Activity :  Managing Stress Meditation.  LRT introduced the stress management technique of stress management.  LRT played a recorded meditation on stress and how to deal with it.  Patients were to follow along with the recording and focus on their breathing and concentration to participate in the technique.  Education:  Stress Management, Discharge Planning.   Education Outcome: Acknowledges edcuation/In group clarification offered/Needs additional education  Clinical Observations/Feedback: Pt did not attend group.     Caroll RancherMarjette Merinda Victorino, LRT/CTRS         Caroll RancherLindsay, Oluwadarasimi Redmon A 12/23/2015 12:01 PM

## 2015-12-23 NOTE — [facility] Group Notes (Signed)
[facility] LCSW Group Therapy 12/23/2015  1:15 PM   Type of Therapy: Group Therapy  Participation Level: Did Not Attend. Patient invited to participate but declined.   Kong Packett, MSW, LCSW Clinical Social Worker [facility] [phone]   

## 2015-12-23 NOTE — Tx Team (Signed)
Interdisciplinary Treatment and Diagnostic Plan Update  12/23/2015 Time of Session: 9:30am Michael Costa MRN: [id number]  Principal Diagnosis: Schizoaffective disorder [name](HCC)  Secondary Diagnoses: Principal Problem:   Schizoaffective disorder (HCC) Active Problems:   Major depressive disorder, recurrent episode, severe (HCC)   Current Medications:  Current Facility-Administered Medications  Medication Dose Route Frequency Provider Last Rate Last Dose  . acetaminophen (TYLENOL) tablet 650 mg  650 mg Oral Q6H PRN Jackelyn PolingJason A Berry, NP      . alum & mag hydroxide-simeth (MAALOX/MYLANTA) 200-200-20 MG/5ML suspension 30 mL  30 mL Oral Q4H PRN Jackelyn PolingJason A Berry, NP      . dicyclomine (BENTYL) tablet 20 mg  20 mg Oral Q6H PRN Jackelyn PolingJason A Berry, NP      . FLUoxetine (PROZAC) capsule 20 mg  20 mg Oral Daily Michael CottaFernando A Cobos, MD   20 mg at 12/23/15 [id number]  . loperamide (IMODIUM) capsule 2-4 mg  2-4 mg Oral PRN Jackelyn PolingJason A Berry, NP      . magnesium hydroxide (MILK OF MAGNESIA) suspension 30 mL  30 mL Oral Daily PRN Jackelyn PolingJason A Berry, NP      . methocarbamol (ROBAXIN) tablet 500 mg  500 mg Oral Q8H PRN Jackelyn PolingJason A Berry, NP   500 mg at 12/22/15 2118  . multivitamin with minerals tablet 1 tablet  1 tablet Oral Daily Michael CottaFernando A Cobos, MD   1 tablet at 12/23/15 [id number]  . naproxen (NAPROSYN) tablet 500 mg  500 mg Oral BID PRN Jackelyn PolingJason A Berry, NP      . nicotine polacrilex (NICORETTE) gum 2 mg  2 mg Oral PRN Michael CottaFernando A Cobos, MD   2 mg at 12/22/15 2119  . ondansetron (ZOFRAN-ODT) disintegrating tablet 4 mg  4 mg Oral Q6H PRN Jackelyn PolingJason A Berry, NP      . risperiDONE (RISPERDAL) tablet 2 mg  2 mg Oral QHS Michael CottaFernando A Cobos, MD   2 mg at 12/22/15 2118  . traZODone (DESYREL) tablet 50 mg  50 mg Oral QHS PRN Jackelyn PolingJason A Berry, NP   50 mg at 12/22/15 2118   PTA Medications: Prescriptions Prior to Admission  Medication Sig Dispense Refill Last Dose  . traZODone (DESYREL) 50 MG tablet Take 1 tablet (50 mg total) by mouth at bedtime as needed for  sleep. 30 tablet 0 Past Week at Unknown time  . benztropine (COGENTIN) 0.5 MG tablet Take 1 tablet (0.5 mg total) by mouth at bedtime. For prevention of drug induced tremors 30 tablet 0 More than a month at Unknown time  . FLUoxetine (PROZAC) 20 MG capsule Take 3 capsules (60 mg total) by mouth daily. For depression 90 capsule 0 More than a month at Unknown time  . hydrOXYzine (ATARAX/VISTARIL) 50 MG tablet Take 1 tablet (50 mg total) by mouth every 6 (six) hours as needed for anxiety. 60 tablet 0 More than a month at Unknown time  . nicotine (NICODERM CQ - DOSED IN MG/24 HOURS) 21 mg/24hr patch Place 1 patch (21 mg total) onto the skin daily. For nicotine addiction 28 patch 0 Past Month at Unknown time  . OLANZapine (ZYPREXA) 5 MG tablet Take 5 tablets (25 mg total) by mouth at bedtime. For mood control 150 tablet 0 More than a month at Unknown time    Patient Stressors: Financial difficulties Legal issue Substance abuse  Patient Strengths: Average or above average intelligence General fund of knowledge Supportive family/friends  Treatment Modalities: Medication Management, Group therapy, Case management,  1 to 1 session  with clinician, Psychoeducation, Recreational therapy.   Physician Treatment Plan for Primary Diagnosis: Schizoaffective disorder (HCC) Long Term Goal(s): Improvement in symptoms so as ready for discharge Improvement in symptoms so as ready for discharge   Short Term Goals: Ability to identify changes in lifestyle to reduce recurrence of condition will improve Ability to verbalize feelings will improve Ability to disclose and discuss suicidal ideas Ability to demonstrate self-control will improve Ability to identify and develop effective coping behaviors will improve Ability to maintain clinical measurements within normal limits will improve Compliance with prescribed medications will improve Ability to identify triggers associated with substance abuse/mental health  issues will improve Ability to identify changes in lifestyle to reduce recurrence of condition will improve Ability to verbalize feelings will improve Ability to disclose and discuss suicidal ideas Ability to demonstrate self-control will improve Ability to identify and develop effective coping behaviors will improve Ability to maintain clinical measurements within normal limits will improve Compliance with prescribed medications will improve Ability to identify triggers associated with substance abuse/mental health issues will improve  Medication Management: Evaluate patient's response, side effects, and tolerance of medication regimen.  Therapeutic Interventions: 1 to 1 sessions, Unit Group sessions and Medication administration.  Evaluation of Outcomes: Progressing  Physician Treatment Plan for Secondary Diagnosis: Principal Problem:   Schizoaffective disorder (HCC) Active Problems:   Major depressive disorder, recurrent episode, severe (HCC)  Long Term Goal(s): Improvement in symptoms so as ready for discharge Improvement in symptoms so as ready for discharge   Short Term Goals: Ability to identify changes in lifestyle to reduce recurrence of condition will improve Ability to verbalize feelings will improve Ability to disclose and discuss suicidal ideas Ability to demonstrate self-control will improve Ability to identify and develop effective coping behaviors will improve Ability to maintain clinical measurements within normal limits will improve Compliance with prescribed medications will improve Ability to identify triggers associated with substance abuse/mental health issues will improve Ability to identify changes in lifestyle to reduce recurrence of condition will improve Ability to verbalize feelings will improve Ability to disclose and discuss suicidal ideas Ability to demonstrate self-control will improve Ability to identify and develop effective coping behaviors will  improve Ability to maintain clinical measurements within normal limits will improve Compliance with prescribed medications will improve Ability to identify triggers associated with substance abuse/mental health issues will improve     Medication Management: Evaluate patient's response, side effects, and tolerance of medication regimen.  Therapeutic Interventions: 1 to 1 sessions, Unit Group sessions and Medication administration.  Evaluation of Outcomes: Progressing   RN Treatment Plan for Primary Diagnosis: Schizoaffective disorder (HCC) Long Term Goal(s): Knowledge of disease and therapeutic regimen to maintain health will improve  Short Term Goals: Ability to remain free from injury will improve, Ability to disclose and discuss suicidal ideas, Ability to identify and develop effective coping behaviors will improve and Compliance with prescribed medications will improve  Medication Management: RN will administer medications as ordered by provider, will assess and evaluate patient's response and provide education to patient for prescribed medication. RN will report any adverse and/or side effects to prescribing provider.  Therapeutic Interventions: 1 on 1 counseling sessions, Psychoeducation, Medication administration, Evaluate responses to treatment, Monitor vital signs and CBGs as ordered, Perform/monitor CIWA, COWS, AIMS and Fall Risk screenings as ordered, Perform wound care treatments as ordered.  Evaluation of Outcomes: Progressing   LCSW Treatment Plan for Primary Diagnosis: Schizoaffective disorder (HCC) Long Term Goal(s): Safe transition to appropriate next level of care at  discharge, Engage patient in therapeutic group addressing interpersonal concerns.  Short Term Goals: Engage patient in aftercare planning with referrals and resources, Increase social support, Increase emotional regulation and Increase skills for wellness and recovery  Therapeutic Interventions: Assess for  all discharge needs, 1 to 1 time with Social worker, Explore available resources and support systems, Assess for adequacy in community support network, Educate family and significant other(s) on suicide prevention, Complete Psychosocial Assessment, Interpersonal group therapy.  Evaluation of Outcomes: Progressing    Progress in Treatment :  Attending groups: Continuing to assess  Participating in groups: Continuing to assess  Taking medication as prescribed: Yes, MD continuing to assess for appropriate medication regimen  Toleration medication: Yes  Family/Significant other contact made: Treatment team assessing for appropriate contacts  Patient understands diagnosis: Yes  Discussing patient identified problems/goals with staff: Yes  Medical problems stabilized or resolved: Yes  Denies suicidal/homicidal ideation: Treatment team continuing to asses  Issues/concerns per patient self-inventory: None reported  Other: N/A  New problem(s) identified: None reported at this time    New Short Term/Long Term Goal(s): None at this time    Discharge Plan or Barriers: Treatment team continuing to assess.    Reason for Continuation of Hospitalization: Anxiety Depression Medication stabilization Suicidal Ideations Withdrawal symptoms  Estimated Length of Stay: 3-5 days    Attendees:  Patient:   Physician: Dr. Mckinley Jewel, Dr. Elna Breslow, MD  12/23/2015   9:30am  Nursing: Melissa Noon, Christa Rodman Pickle 12/23/2015 9:30am  RN Care Manager: Onnie Boer, CM  12/23/2015 9:30am  Social Workers: [name], LCSW, Samuella Bruin, LCSW, Heather Smart, LCSW   12/23/2015 9:30am  Nurse Pratictioners: Gray Bernhardt, NP, Armandina Stammer, Hillery Jacks, NP 12/23/2015 9:30am  Other:  12/23/2015 9:30am    Scribe for Treatment Team: Samuella Bruin, LCSW Clinical Social Worker [facility] [phone]

## 2015-12-23 NOTE — Progress Notes (Signed)
Nursing Progress Note 7P-7A  D) Patient presents anxious and fidgety. Patient reports a good day with no complaints. Patient denies SI/HI/AVH or pain at this time. Patient appears preoccupied with internal stimuli. Patient minimal with interaction with Clinical research associatewriter. Patient requests medication for sleep. Patient contracts for safety at this time.  A) PRN trazodone given as prescribed. Emotional support and encouragement given. Patient on Q 15min safety checks. Opportunities for questions or concerns presented to patient. Patient encouraged to continue to work on treatment plan.  R) Patient currently sleeping. Patient remains safe on the unit at this time. Will continue to monitor.

## 2015-12-23 NOTE — Progress Notes (Signed)
[facility]  12/23/2015 2:40 PM  Patient Active Problem List   Diagnosis Date Noted  . Major depressive disorder, recurrent episode, severe (HCC) 12/21/2015  . Schizoaffective disorder (HCC) 12/21/2015  . MDD (major depressive disorder), recurrent, severe, with psychosis (HCC) 04/07/2015  . Opioid use disorder, moderate, dependence (HCC) 03/05/2015  . Alcohol use disorder, mild, abuse 03/05/2015  . Moderate benzodiazepine use disorder (HCC) 03/05/2015  . Tobacco use disorder 03/05/2015    Diagnosis: Opiate use disorder, alcohol use disorder, benzodiazepine use disorder, methamphetamine use disorder, history of mood disorder regulation and aggressive behavior  Subjective: Patient was admitted over the weekend. He reports he is here for the "same old mess." He has been living with his mother and grandmother per chart reports they have put him out and the patient states he is not sure if he can go back there. Patient had complained of depression and anxiety and some vague suicidal ideation on admission and he apparently was being aggressive with family members prior to admission. He is currently been started on Prozac 20 mg by mouth daily risperidone 2 mg B every at bedtime and trazodone 50 mg by mouth every day.  Patient endorses rather vague symptoms that are not consistent with known psychotic or organic pathology he reports a feeling of something "stinging me on the leg" he states in "different places" but can't specify he states this happens 4-5 times a day and is a feeling as if electricity has suddenly touched him. He also endorses "seeing lights and stuff like that." He endorses that his mood is "crappy." He does deny any current acute suicidal or homicidal ideation, plan or intent.  Patient has had at least 4 inpatient stays this year he was here on 03/04/15-03/07/15, 04/07/15-04/15/15, for/3/17-4/12/17 and also reports that [facility].  Asian  has a history of using multiple substances however UDS and alcohol levels were negative on admission. He did report no use for 1 month, probably because he was inpatient or else possibly in jail.  Objective: Well-developed well-nourished somewhat reserved and slightly disheveled young man in no apparent distress, pleasant and cooperative if not very forthcoming, speech and motor within normal limits, mood is described as "crappy" and affect is a bit dysphoric, thought processes are linear and goal-directed thought content endorses some visual and tactile phenomena, alert and oriented, insight and judgment are limited IQ appears an average range         Current Facility-Administered Medications (Analgesics):  .  acetaminophen (TYLENOL) tablet 650 mg .  naproxen (NAPROSYN) tablet 500 mg     Current Facility-Administered Medications (Other):  .  alum & mag hydroxide-simeth (MAALOX/MYLANTA) 200-200-20 MG/5ML suspension 30 mL .  dicyclomine (BENTYL) tablet 20 mg .  FLUoxetine (PROZAC) capsule 20 mg .  hydrOXYzine (ATARAX/VISTARIL) tablet 50 mg .  loperamide (IMODIUM) capsule 2-4 mg .  magnesium hydroxide (MILK OF MAGNESIA) suspension 30 mL .  methocarbamol (ROBAXIN) tablet 500 mg .  multivitamin with minerals tablet 1 tablet .  nicotine polacrilex (NICORETTE) gum 2 mg .  ondansetron (ZOFRAN-ODT) disintegrating tablet 4 mg .  risperiDONE (RISPERDAL) tablet 2 mg .  traZODone (DESYREL) tablet 50 mg  No current outpatient prescriptions on file.  Vital Signs:Blood pressure 105/74, pulse 97, temperature 98.4 F (36.9 C), resp. rate 17, height 5\' 6"  (1.676 m), weight 67.1 kg (148 lb), SpO2 99 %.    Lab Results: No results found for this or any  previous visit (from the past 48 hour(s)).  Physical Findings: AIMS: Facial and Oral Movements Muscles of Facial Expression: None, normal Lips and Perioral Area: None, normal Jaw: None, normal Tongue: None, normal,Extremity Movements Upper  (arms, wrists, hands, fingers): None, normal Lower (legs, knees, ankles, toes): None, normal, Trunk Movements Neck, shoulders, hips: None, normal, Overall Severity Severity of abnormal movements (highest score from questions above): None, normal Incapacitation due to abnormal movements: None, normal Patient's awareness of abnormal movements (rate only patient's report): No Awareness, Dental Status Current problems with teeth and/or dentures?: Yes Does patient usually wear dentures?: No  CIWA:  CIWA-Ar Total: 0 COWS:  COWS Total Score: 1   Assessment/Plan: Substance use disorder, no current use reported and patient does not endorse any withdrawal continue to monitor. Frequent hospitalizations social work will explore referring patient to an act team. Patient appears to have some emotion dysregulation and acting out behavior especially towards family members, currently it appears that he does not endorse actual psychotic symptoms and may in fact be feigning some symptoms perhaps feeling that he needs to do so to obtain needed services. Patient is willing to continue on his current medications and denies any acute side effects aims exam was done today it is 0.  Acquanetta SitElizabeth Woods Loreta Blouch, MD 12/23/2015, 2:40 PM

## 2015-12-24 DIAGNOSIS — F1920 Other psychoactive substance dependence, uncomplicated: Secondary | ICD-10-CM | POA: Diagnosis present

## 2015-12-24 NOTE — Progress Notes (Signed)
Advance Directive booklet given to patient per MD instructions.   

## 2015-12-24 NOTE — Progress Notes (Signed)
D:  Patient's self inventory sheet, patient sleeps good, sleep medication is helpful.  Good appetite, low energy level, poor concentration.  Rated depression and hopeless 5, anxiety 10.   Withdrawals, cravings.  SI off/on, no plan, contracts for safety.  Physical problems, achy body  Denied pain.  Goal is to feel better.  [place] A:  Medications administered per MD orders.  Emotional support and encouragement given patient. R:  Denied HI.  Denied A/V hallucinations.  SI off/on, contracts for safety.  Denied plan.  Safety maintained with 15 minute checks.

## 2015-12-24 NOTE — [facility] Group Notes (Signed)
[facility] LCSW Group Therapy 12/24/2015  1:15 PM   Type of Therapy: Group Therapy  Participation Level: Did Not Attend. Patient invited to participate but declined.   Jaquilla Woodroof, MSW, LCSW Clinical Social Worker [facility] [phone]   

## 2015-12-24 NOTE — Plan of Care (Signed)
Problem: Education: Goal: Ability to make informed decisions regarding treatment will improve Outcome: Progressing Nurse discussed suicide, depression/coping skills with patient.

## 2015-12-24 NOTE — Progress Notes (Signed)
[facility]  12/24/2015 2:09 PM  Patient Active Problem List   Diagnosis Date Noted  . Polysubstance (including opioids) dependence with physiol dependence (HCC) 12/24/2015  . Major depressive disorder, recurrent episode, severe (HCC) 12/21/2015  . Schizoaffective disorder (HCC) 12/21/2015  . MDD (major depressive disorder), recurrent, severe, with psychosis (HCC) 04/07/2015  . Opioid use disorder, moderate, dependence (HCC) 03/05/2015  . Alcohol use disorder, mild, abuse 03/05/2015  . Moderate benzodiazepine use disorder (HCC) 03/05/2015  . Tobacco use disorder 03/05/2015    Diagnosis: Polysubstance use disorder including methamphetamine, opiates, benzodiazepines and alcohol  Subjective: Patient denies any acute concerns, denies any current suicidal or homicidal ideation, plan or intent requests ensure apparently simply because he wants it. Patient was informed that he is not a candidate for an sure.  Objective: Well developed well nourished man in no apparent distress he is appropriate in behavior although he does not appear to be completely appropriately engaged in the milieu, processes appear linear and goal-directed, thought content no current suicidal or homicidal ideation, plan or intent, no psychotic symptoms endorsed or observed currently, alert and oriented, insight and judgment are limited, IQ appears an average range         Current Facility-Administered Medications (Analgesics):  .  acetaminophen (TYLENOL) tablet 650 mg .  naproxen (NAPROSYN) tablet 500 mg     Current Facility-Administered Medications (Other):  .  alum & mag hydroxide-simeth (MAALOX/MYLANTA) 200-200-20 MG/5ML suspension 30 mL .  dicyclomine (BENTYL) tablet 20 mg .  FLUoxetine (PROZAC) capsule 20 mg .  hydrOXYzine (ATARAX/VISTARIL) tablet 50 mg .  loperamide (IMODIUM) capsule 2-4 mg .  magnesium hydroxide (MILK OF MAGNESIA) suspension 30 mL .  methocarbamol (ROBAXIN) tablet 500 mg .   multivitamin with minerals tablet 1 tablet .  nicotine polacrilex (NICORETTE) gum 2 mg .  ondansetron (ZOFRAN-ODT) disintegrating tablet 4 mg .  risperiDONE (RISPERDAL) tablet 2 mg .  traZODone (DESYREL) tablet 50 mg  No current outpatient prescriptions on file.  Vital Signs:Blood pressure 122/69, pulse 73, temperature 97.9 F (36.6 C), temperature source Oral, resp. rate 18, height 5\' 6"  (1.676 m), weight 67.1 kg (148 lb), SpO2 99 %.    Lab Results: No results found for this or any previous visit (from the past 48 hour(s)).  Physical Findings: AIMS: Facial and Oral Movements Muscles of Facial Expression: None, normal Lips and Perioral Area: None, normal Jaw: None, normal Tongue: None, normal,Extremity Movements Upper (arms, wrists, hands, fingers): None, normal Lower (legs, knees, ankles, toes): None, normal, Trunk Movements Neck, shoulders, hips: None, normal, Overall Severity Severity of abnormal movements (highest score from questions above): None, normal Incapacitation due to abnormal movements: None, normal Patient's awareness of abnormal movements (rate only patient's report): No Awareness, Dental Status Current problems with teeth and/or dentures?: Yes Does patient usually wear dentures?: No  CIWA:  CIWA-Ar Total: 0 COWS:  COWS Total Score: 1   Assessment/Plan: Patient has done well with mood regulation so far on the ward and appears to be tolerating his current medications well without reports of acute symptoms. He will work with social work to discuss possible follow-up for treatment of his substance use disorders and mental health issues. If present course continues patient may be discharged within the next day or so.  Acquanetta SitElizabeth Woods Jaquanna Ballentine, MD 12/24/2015, 2:09 PM

## 2015-12-24 NOTE — Plan of Care (Signed)
Problem: Activity: Goal: Interest or engagement in activities will improve Outcome: Not Progressing Patient not attending groups on own accord. Patient attends after staff encouragement.   Problem: Safety: Goal: Periods of time without injury will increase Outcome: Progressing Patient remains safe on the unit at this time. Patient on Q 15 min safety checks. VSS.

## 2015-12-24 NOTE — Progress Notes (Signed)
The patient was in and out of the A.A.meeting last evening.

## 2015-12-24 NOTE — [facility] Suicide Risk Assessment (Signed)
[facility] INPATIENT:  Family/Significant Other Suicide Prevention Education  Suicide Prevention Education:  Education Completed; mother April Fulp [phone],  (name of family member/significant other) has been identified by the patient as the family member/significant other with whom the patient will be residing, and identified as the person(s) who will aid the patient in the event of a mental health crisis (suicidal ideations/suicide attempt).  With written consent from the patient, the family member/significant other has been provided the following suicide prevention education, prior to the and/or following the discharge of the patient.  The suicide prevention education provided includes the following:  Suicide risk factors  Suicide prevention and interventions  National Suicide Hotline telephone number  [facility] assessment telephone number  [facility] Emergency Assistance 911  [facility] and/or [facility]idential Mobile Crisis [facility] telephone number  Request made of family/significant other to:  Remove weapons (e.g., guns, rifles, knives), all items previously/currently identified as safety concern.    Remove drugs/medications (over-the-counter, prescriptions, illicit drugs), all items previously/currently identified as a safety concern.  The family member/significant other verbalizes understanding of the suicide prevention education information provided.  The family member/significant other agrees to remove the items of safety concern listed above.  Michael Costa 12/24/2015, 11:23 AM

## 2015-12-24 NOTE — [facility] Group Notes (Signed)
Adult Psychoeducational Group Note  Date:  12/24/2015 Time:  0900-0930  Group Topic/Focus: Recovery Goals  Patient invited to join but did not attend.  Shailey Butterbaugh P Jeromy Borcherding 12/24/2015, 10:35 AM 

## 2015-12-25 MED ORDER — HYDROXYZINE HCL 50 MG PO TABS: 50.0000 mg | ORAL_TABLET | Freq: Four times a day (QID) | ORAL | 0 refills | Status: AC | PRN

## 2015-12-25 MED ORDER — RISPERIDONE 2 MG PO TABS: 2.0000 mg | ORAL_TABLET | Freq: Every day | ORAL | 0 refills | Status: AC

## 2015-12-25 MED ORDER — TRAZODONE HCL 50 MG PO TABS: 50.0000 mg | ORAL_TABLET | Freq: Every evening | ORAL | 0 refills | Status: AC | PRN

## 2015-12-25 MED ORDER — NICOTINE POLACRILEX 2 MG MT GUM: 2.0000 mg | CHEWING_GUM | OROMUCOSAL | 0 refills | Status: AC | PRN

## 2015-12-25 MED ORDER — FLUOXETINE HCL 20 MG PO CAPS: 20.0000 mg | ORAL_CAPSULE | Freq: Every day | ORAL | 0 refills | Status: AC

## 2015-12-25 NOTE — Tx Team (Signed)
Interdisciplinary Treatment and Diagnostic Plan Update  12/25/2015 Time of Session: 9:30am Michael Costa MRN: [id number]  Principal Diagnosis: Polysubstance (including opioids) dependence with physiol dependence ([place])  Secondary Diagnoses: Principal Problem:   Polysubstance (including opioids) dependence with physiol dependence ([place])   Current Medications:  Current Facility-Administered Medications  Medication Dose Route Frequency Provider Last Rate Last Dose  . acetaminophen (TYLENOL) tablet 650 mg  650 mg Oral Q6H PRN Rozetta Nunnery, NP   650 mg at 12/24/15 1508  . alum & mag hydroxide-simeth (MAALOX/MYLANTA) 200-200-20 MG/5ML suspension 30 mL  30 mL Oral Q4H PRN Rozetta Nunnery, NP      . dicyclomine (BENTYL) tablet 20 mg  20 mg Oral Q6H PRN Rozetta Nunnery, NP      . FLUoxetine (PROZAC) capsule 20 mg  20 mg Oral Daily Jenne Campus, MD   20 mg at 12/25/15 0808  . hydrOXYzine (ATARAX/VISTARIL) tablet 50 mg  50 mg Oral Q6H PRN Linard Millers, MD   50 mg at 12/24/15 1508  . loperamide (IMODIUM) capsule 2-4 mg  2-4 mg Oral PRN Rozetta Nunnery, NP      . magnesium hydroxide (MILK OF MAGNESIA) suspension 30 mL  30 mL Oral Daily PRN Rozetta Nunnery, NP      . methocarbamol (ROBAXIN) tablet 500 mg  500 mg Oral Q8H PRN Rozetta Nunnery, NP   500 mg at 12/22/15 2118  . multivitamin with minerals tablet 1 tablet  1 tablet Oral Daily Jenne Campus, MD   1 tablet at 12/25/15 3086  . naproxen (NAPROSYN) tablet 500 mg  500 mg Oral BID PRN Rozetta Nunnery, NP      . nicotine polacrilex (NICORETTE) gum 2 mg  2 mg Oral PRN Jenne Campus, MD   2 mg at 12/24/15 2137  . ondansetron (ZOFRAN-ODT) disintegrating tablet 4 mg  4 mg Oral Q6H PRN Rozetta Nunnery, NP      . risperiDONE (RISPERDAL) tablet 2 mg  2 mg Oral QHS Jenne Campus, MD   2 mg at 12/24/15 2136  . traZODone (DESYREL) tablet 50 mg  50 mg Oral QHS PRN Rozetta Nunnery, NP   50 mg at 12/24/15 2137   PTA Medications: Prescriptions Prior to  Admission  Medication Sig Dispense Refill Last Dose  . traZODone (DESYREL) 50 MG tablet Take 1 tablet (50 mg total) by mouth at bedtime as needed for sleep. 30 tablet 0 Past Week at Unknown time  . benztropine (COGENTIN) 0.5 MG tablet Take 1 tablet (0.5 mg total) by mouth at bedtime. For prevention of drug induced tremors 30 tablet 0 More than a month at Unknown time  . FLUoxetine (PROZAC) 20 MG capsule Take 3 capsules (60 mg total) by mouth daily. For depression 90 capsule 0 More than a month at Unknown time  . hydrOXYzine (ATARAX/VISTARIL) 50 MG tablet Take 1 tablet (50 mg total) by mouth every 6 (six) hours as needed for anxiety. 60 tablet 0 More than a month at Unknown time  . nicotine (NICODERM CQ - DOSED IN MG/24 HOURS) 21 mg/24hr patch Place 1 patch (21 mg total) onto the skin daily. For nicotine addiction 28 patch 0 Past Month at Unknown time  . OLANZapine (ZYPREXA) 5 MG tablet Take 5 tablets (25 mg total) by mouth at bedtime. For mood control 150 tablet 0 More than a month at Unknown time    Patient Stressors: Financial difficulties Legal issue Substance abuse  Patient  Strengths: Average or above average intelligence General fund of knowledge Supportive family/friends  Treatment Modalities: Medication Management, Group therapy, Case management,  1 to 1 session with clinician, Psychoeducation, Recreational therapy.   Physician Treatment Plan for Primary Diagnosis: Polysubstance (including opioids) dependence with physiol dependence ([place]) Long Term Goal(s): Improvement in symptoms so as ready for discharge Improvement in symptoms so as ready for discharge   Short Term Goals: Ability to identify changes in lifestyle to reduce recurrence of condition will improve Ability to verbalize feelings will improve Ability to disclose and discuss suicidal ideas Ability to demonstrate self-control will improve Ability to identify and develop effective coping behaviors will improve Ability to  maintain clinical measurements within normal limits will improve Compliance with prescribed medications will improve Ability to identify triggers associated with substance abuse/mental health issues will improve Ability to identify changes in lifestyle to reduce recurrence of condition will improve Ability to verbalize feelings will improve Ability to disclose and discuss suicidal ideas Ability to demonstrate self-control will improve Ability to identify and develop effective coping behaviors will improve Ability to maintain clinical measurements within normal limits will improve Compliance with prescribed medications will improve Ability to identify triggers associated with substance abuse/mental health issues will improve  Medication Management: Evaluate patient's response, side effects, and tolerance of medication regimen.  Therapeutic Interventions: 1 to 1 sessions, Unit Group sessions and Medication administration.  Evaluation of Outcomes: Met  Physician Treatment Plan for Secondary Diagnosis: Principal Problem:   Polysubstance (including opioids) dependence with physiol dependence ([place])  Long Term Goal(s): Improvement in symptoms so as ready for discharge Improvement in symptoms so as ready for discharge   Short Term Goals: Ability to identify changes in lifestyle to reduce recurrence of condition will improve Ability to verbalize feelings will improve Ability to disclose and discuss suicidal ideas Ability to demonstrate self-control will improve Ability to identify and develop effective coping behaviors will improve Ability to maintain clinical measurements within normal limits will improve Compliance with prescribed medications will improve Ability to identify triggers associated with substance abuse/mental health issues will improve Ability to identify changes in lifestyle to reduce recurrence of condition will improve Ability to verbalize feelings will improve Ability to  disclose and discuss suicidal ideas Ability to demonstrate self-control will improve Ability to identify and develop effective coping behaviors will improve Ability to maintain clinical measurements within normal limits will improve Compliance with prescribed medications will improve Ability to identify triggers associated with substance abuse/mental health issues will improve     Medication Management: Evaluate patient's response, side effects, and tolerance of medication regimen.  Therapeutic Interventions: 1 to 1 sessions, Unit Group sessions and Medication administration.  Evaluation of Outcomes: Met   RN Treatment Plan for Primary Diagnosis: Polysubstance (including opioids) dependence with physiol dependence ([place]) Long Term Goal(s): Knowledge of disease and therapeutic regimen to maintain health will improve  Short Term Goals: Ability to remain free from injury will improve, Ability to disclose and discuss suicidal ideas, Ability to identify and develop effective coping behaviors will improve and Compliance with prescribed medications will improve  Medication Management: RN will administer medications as ordered by provider, will assess and evaluate patient's response and provide education to patient for prescribed medication. RN will report any adverse and/or side effects to prescribing provider.  Therapeutic Interventions: 1 on 1 counseling sessions, Psychoeducation, Medication administration, Evaluate responses to treatment, Monitor vital signs and CBGs as ordered, Perform/monitor CIWA, COWS, AIMS and Fall Risk screenings as ordered, Perform wound care treatments  as ordered.  Evaluation of Outcomes: Met   LCSW Treatment Plan for Primary Diagnosis: Polysubstance (including opioids) dependence with physiol dependence ([place]) Long Term Goal(s): Safe transition to appropriate next level of care at discharge, Engage patient in therapeutic group addressing interpersonal concerns.  Short  Term Goals: Engage patient in aftercare planning with referrals and resources, Increase social support, Increase emotional regulation and Increase skills for wellness and recovery  Therapeutic Interventions: Assess for all discharge needs, 1 to 1 time with Social worker, Explore available resources and support systems, Assess for adequacy in community support network, Educate family and significant other(s) on suicide prevention, Complete Psychosocial Assessment, Interpersonal group therapy.  Evaluation of Outcomes: Met  Progress in Treatment :  Attending groups: Continuing to assess  Participating in groups: Continuing to assess  Taking medication as prescribed: Yes, MD continuing to assess for appropriate medication regimen  Toleration medication: Yes  Family/Significant other contact made: Treatment team assessing for appropriate contacts  Patient understands diagnosis: Yes  Discussing patient identified problems/goals with staff: Yes  Medical problems stabilized or resolved: Yes  Denies suicidal/homicidal ideation: Treatment team continuing to asses  Issues/concerns per patient self-inventory: None reported  Other: N/A  New problem(s) identified: None reported at this time    New Short Term/Long Term Goal(s): None at this time    Discharge Plan or Barriers: Pt referred to [facility] in [facility] and was told to inquire about ACT services at hospital follow-up appt. Pt requesting discharge on Wed.     Reason for Continuation of Hospitalization: none  Estimated Length of Stay: d/c today    Attendees:  Patient:   Physician: Dr. Sharolyn Douglas, Dr. Shea Evans, MD  12/25/2015   9:30am  Nursing: Verdene Rio RN 12/25/2015 9:30am  RN Care Manager: Lars Pinks, [place]  12/25/2015 9:30am  Social Workers:Johntay Doolen Smart, LCSW   12/25/2015 9:30am  Nurse Pratictioners: Lindell Spar NP 12/25/2015 9:30am  Other:  12/25/2015 9:30am    Scribe for Treatment Team: Maxie Better, MSW, LCSW Clinical Social Worker 12/25/2015 11:39 AM

## 2015-12-25 NOTE — [facility] Suicide Risk Assessment (Signed)
[facility]   Principal Problem: Polysubstance (including opioids) dependence with physiol dependence [facility](HCC) Discharge Diagnoses:  Patient Active Problem List   Diagnosis Date Noted  . Polysubstance (including opioids) dependence with physiol dependence (HCC) [F19.20] 12/24/2015  . Major depressive disorder, recurrent episode, severe (HCC) [F33.2] 12/21/2015  . Schizoaffective disorder (HCC) [F25.9] 12/21/2015  . MDD (major depressive disorder), recurrent, severe, with psychosis (HCC) [F33.3] 04/07/2015  . 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 Alcohol use disorder, mild, abuse [F10.10] 03/05/2015  . 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 Tobacco use disorder [F17.200] 03/05/2015    Total Time spent with patient: 15 minutes  Musculoskeletal: Strength & Muscle Tone: within normal limits Gait & Station: normal Patient leans: N/A  Psychiatric Specialty Exam: ROS  Blood pressure 93/61, pulse (!) 104, temperature 97.8 F (36.6 C), temperature source Oral, resp. rate 16, height 5\' 6"  (1.676 m), weight 67.1 kg (148 lb), SpO2 99 %.Body mass index is 23.89 kg/m.  General Appearance: Casual  Eye Contact::  Fair  Speech:  Clear and Coherent409  Volume:  Normal  Mood:  Irritable  Affect:  Congruent  Thought Process:  Coherent  Orientation:  Full (Time, Place, and Person)  Thought Content:  Negative  Suicidal Thoughts:  No  Homicidal Thoughts:  No  Memory:  Negative  Judgement:  Fair  Insight:  Present  Psychomotor Activity:  Normal  Concentration:  Fair  Recall:  Good  Fund of Knowledge:Good  Language: Good  Akathisia:  No  Handed:  Right  AIMS (if indicated):   0  Assets:  Resilience  Sleep:  Number of Hours: 6  Cognition: WNL  ADL's:  Intact   Mental Status Per Nursing Assessment::   On Admission:  NA  Demographic Factors:  Male and Caucasian  Loss Factors: Financial  problems/change in socioeconomic status  Historical Factors: Impulsivity  Risk Reduction Factors:   Living with another person, especially a relative  Continued Clinical Symptoms:  Alcohol/Substance Abuse/Dependencies  Cognitive Features That Contribute To Risk:  None    Suicide Risk:  Mild:  Suicidal ideation of limited frequency, intensity, duration, and specificity.  There are no identifiable plans, no associated intent, mild dysphoria and related symptoms, good self-control (both objective and subjective assessment), few other risk factors, and identifiable protective factors, including available and accessible social support.  Follow-up Information    [facility] Follow up on 12/30/2015.   Why:  Hospital follow up appointment for therapy and medication management services on Monday Nov. 27th between 8-10am. Please bring any updated proof of income and call if you need to reschedule. Ask about [facility] Treatment Team (ACTT) services. Contact information: [address] [place] [id number] [phone]           Plan Of Care/Follow-up recommendations:  Other:  She denies suicidal or homicidal ideation, plan or intent at time of discharge. He is encouraged to continue to follow-up for possible act team placement.  Acquanetta SitElizabeth Woods Anatole Apollo, MD 12/25/2015, 11:36 AM

## 2015-12-25 NOTE — Plan of Care (Signed)
Problem: Medication: Goal: Compliance with prescribed medication regimen will improve Outcome: Progressing Patient taking medications as prescribed without issues or concerns.  Problem: Safety: Goal: Ability to remain free from injury will improve Outcome: Progressing Patient remains safe on the unit as this time. Patient free of injury, on Q 15 min safety checks.

## 2015-12-25 NOTE — Progress Notes (Signed)
Discharge note:  Patient discharged home per MD order.  Patient received all personal belongings from unit and locker.  Patient denies any thoughts of self harm.  [facility]  He received prescriptions and samples of his medications.  Reviewed AVS/transition record with patient and he indicated understanding.  Patient left ambulatory with his mother.

## 2015-12-25 NOTE — Progress Notes (Signed)
Recreation Therapy Notes  Date: 12/25/15 Time: 0930 Location: [address] Dayroom  Group Topic: Stress Management  Goal Area(s) Addresses:  Patient will verbalize importance of using healthy stress management.  Patient will identify positive emotions associated with healthy stress management.   Intervention: Calm App  Activity :  Happiness Meditation.  LRT introduced the stress management technique of meditation.  LRT played a meditation on happiness to allow patients to engage in the technique.  Patients were to follow along and focus on the meditation to experience the benefits of meditation.  Education:  Stress Management, Discharge Planning.   Education Outcome: Acknowledges edcuation/In group clarification offered/Needs additional education  Clinical Observations/Feedback: Pt did not attend group.     Caroll RancherMarjette Brigido Mera, LRT/CTRS         Caroll RancherLindsay, Ilianna Bown A 12/25/2015 12:13 PM

## 2015-12-25 NOTE — [facility] Group Notes (Signed)
Patient attend group. His day was a 5. His goal was to leave t today. He did not go home. He did not talk to the doctor. He did not talk to the nurse today to find out why. He will talk to his nurse tonight.

## 2015-12-25 NOTE — Discharge Summary (Signed)
Physician Discharge Summary Note  Patient:  Michael ClayJustin Priest is an 25 y.o., male MRN:  [id number] DOB:  15-Oct-1990 Patient phone:  [phone] (home)  Patient address[place]   [address] [place] [address],  Total Time spent with patient: 30 minutes  Date of Admission:  12/20/2015 Date of Discharge: 12/25/2015  Reason for Admission:  Worsening depression  Principal Problem: Polysubstance (including opioids) dependence with physiol dependence [facility](HCC) Discharge Diagnoses: Patient Active Problem List   Diagnosis Date Noted  . Major depressive disorder, recurrent episode, severe (HCC) [F33.2] 12/21/2015    Priority: High  . MDD (major depressive disorder), recurrent, severe, with psychosis (HCC) [F33.3] 04/07/2015    Priority: High  . Polysubstance (including opioids) dependence with physiol dependence (HCC) [F19.20] 12/24/2015  . Schizoaffective disorder (HCC) [F25.9] 12/21/2015  . 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 Alcohol use disorder, mild, abuse [F10.10] 03/05/2015  . 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 Tobacco use disorder [F17.200] 03/05/2015    Past Psychiatric History:  See HPI  Past Medical History:  Past Medical History:  Diagnosis Date  . Medical history non-contributory   . Polysubstance abuse    opiates, benzos, marijuana    Past Surgical History:  Procedure Laterality Date  . NO PAST SURGERIES     Family History:  Family History  Problem Relation Age of Onset  . Drug abuse Other   . Mental illness Neg Hx    Family Psychiatric  History:  See HPI Social History:  History  Alcohol Use  . 3.0 oz/week  . 5 Cans of beer per week    Comment: occasional     History  Drug Use  . Types: Marijuana, Hydrocodone, Oxycodone, Benzodiazepines    Comment: pain pills, opiates, benzos    Social History   Social History  . Marital status: Single    Spouse name: N/A  . Number of children: N/A  . Years of  education: N/A   Social History Main Topics  . Smoking status: Current Every Day Smoker    Packs/day: 2.00    Years: 7.00    Types: Cigarettes  . Smokeless tobacco: Never Used  . Alcohol use 3.0 oz/week    5 Cans of beer per week     Comment: occasional  . Drug use:     Types: Marijuana, Hydrocodone, Oxycodone, Benzodiazepines     Comment: pain pills, opiates, benzos  . Sexual activity: Not Currently    Birth control/ protection: None   Other Topics Concern  . None   Social History Narrative  . None    Hospital Course:   Aloha GellJustin Smithis an 25 y.o.single malewho presented unaccompanied [facility] reporting symptoms of depression and anxiety. Pt has a history of depression, anxiety and substance abuse and states he has not taken his medications consistently and not [facility].  Michael ClayJustin Kiedrowski was admitted for Polysubstance (including opioids) dependence with physiol dependence (HCC) and crisis management.  Patient was treated with medications with their indications listed below in detail under Medication List.  Medical problems were identified and treated as needed.  Home medications were restarted as appropriate.  Improvement was monitored by observation and Michael ClayJustin Barbuto daily report of symptom reduction.  Emotional and mental status was monitored by daily self inventory reports completed by Michael ClayJustin Swicegood and clinical staff.  Patient reported continued improvement, denied any new concerns.  Patient had been compliant on medications and denied side effects.  Support and encouragement was provided.    Patient encouraged to attend groups to help with recognizing triggers of emotional crises and de-stabilizations.  Patient encouraged to attend group to help identify the positive things in life that would help in dealing with feelings of loss, depression and unhealthy or abusive tendencies.         Michael ClayJustin Louis was evaluated by the treatment team for  stability and plans for continued recovery upon discharge.  Patient was offered further treatment options upon discharge including Residential, Intensive Outpatient and Outpatient treatment. Patient will follow up with agency listed below for medication management and counseling.  Encouraged patient to maintain satisfactory support network and home environment.  Advised to adhere to medication compliance and outpatient treatment follow up.  Prescriptions provided.       Michael ClayJustin Lindon motivation was an integral factor for scheduling further treatment.  Employment, transportation, bed availability, health status, family support, and any pending legal issues were also considered during patient's hospital stay.  Upon completion of this admission the patient was both mentally and medically stable for discharge denying suicidal/homicidal ideation, auditory/visual/tactile hallucinations, delusional thoughts and paranoia.      Physical Findings: AIMS: Facial and Oral Movements Muscles of Facial Expression: None, normal Lips and Perioral Area: None, normal Jaw: None, normal Tongue: None, normal,Extremity Movements Upper (arms, wrists, hands, fingers): None, normal Lower (legs, knees, ankles, toes): None, normal, Trunk Movements Neck, shoulders, hips: None, normal, Overall Severity Severity of abnormal movements (highest score from questions above): None, normal Incapacitation due to abnormal movements: None, normal Patient's awareness of abnormal movements (rate only patient's report): No Awareness, Dental Status Current problems with teeth and/or dentures?: Yes Does patient usually wear dentures?: No  CIWA:  CIWA-Ar Total: 1 COWS:  COWS Total Score: 0  Musculoskeletal: Strength & Muscle Tone: within normal limits Gait & Station: normal Patient leans: N/A  Psychiatric Specialty Exam: Physical Exam  Nursing note and vitals reviewed.   ROS  Blood pressure 93/61, pulse (!) 104, temperature 97.8 F  (36.6 C), temperature source Oral, resp. rate 16, height 5\' 6"  (1.676 m), weight 67.1 kg (148 lb), SpO2 99 %.Body mass index is 23.89 kg/m.    Have you used any form of tobacco in the last 30 days? (Cigarettes, Smokeless Tobacco, Cigars, and/or Pipes): Yes  Has this patient used any form of tobacco in the last 30 days? (Cigarettes, Smokeless Tobacco, Cigars, and/or Pipes) Yes, N/A  Blood Alcohol level:  Lab Results  Component Value Date   ETH 5 (H) 12/20/2015   ETH <5 05/04/2015    Metabolic Disorder Labs:  Lab Results  Component Value Date   HGBA1C 5.2 12/21/2015   MPG 103 12/21/2015   MPG 105 04/09/2015   Lab Results  Component Value Date   PROLACTIN 8.6 12/21/2015   PROLACTIN 46.1 (H) 05/08/2015   Lab Results  Component Value Date   CHOL 153 12/21/2015   TRIG 122 12/21/2015   HDL 31 (L) 12/21/2015   CHOLHDL 4.9 12/21/2015   VLDL 24 12/21/2015   LDLCALC 98 12/21/2015   LDLCALC 85 04/09/2015    See Psychiatric Specialty Exam and Suicide Risk Assessment completed by Attending Physician prior to discharge.  Discharge destination:  Home  Is patient on multiple antipsychotic therapies at discharge:  No   Has Patient had three or more failed trials of antipsychotic monotherapy by history:  No  Recommended Plan for Multiple Antipsychotic Therapies: NA     Medication List    STOP  taking these medications   benztropine 0.5 MG tablet Commonly known as:  COGENTIN   nicotine 21 mg/24hr patch Commonly known as:  NICODERM CQ - dosed in mg/24 hours   OLANZapine 5 MG tablet Commonly known as:  ZYPREXA     TAKE these medications     Indication  FLUoxetine 20 MG capsule Commonly known as:  PROZAC Take 1 capsule (20 mg total) by mouth daily. Start taking on:  12/26/2015 What changed:  how much to take  additional instructions  Indication:  Depression   hydrOXYzine 50 MG tablet Commonly known as:  ATARAX/VISTARIL Take 1 tablet (50 mg total) by mouth every 6  (six) hours as needed for anxiety.  Indication:  Anxiety Neurosis   nicotine polacrilex 2 MG gum Commonly known as:  NICORETTE Take 1 each (2 mg total) by mouth as needed for smoking cessation.  Indication:  Nicotine Addiction   risperiDONE 2 MG tablet Commonly known as:  RISPERDAL Take 1 tablet (2 mg total) by mouth at bedtime.  Indication:  Major Depressive Disorder   traZODone 50 MG tablet Commonly known as:  DESYREL Take 1 tablet (50 mg total) by mouth at bedtime as needed for sleep.  Indication:  Trouble Sleeping      Follow-up Information    [facility] Follow up on 12/30/2015.   Why:  Hospital follow up appointment for therapy and medication management services on Monday Nov. 27th between 8-10am. Please bring any updated proof of income and call if you need to reschedule. Ask about [employer] copywriter (ACTT) services. Contact information: [address] [place] [address] [phone]           Follow-up recommendations:  Activity:  as tol Diet:  as tol  Comments:  1.  Take all your medications as prescribed.   2.  Report any adverse side effects to outpatient provider. 3.  Patient instructed to not use alcohol or illegal drugs while on prescription medicines. 4.  In the event of worsening symptoms, instructed patient to call 911, the crisis hotline or go to nearest emergency room for evaluation of symptoms.  Signed: Lindwood Qua, NP [facility] 12/25/2015, 1:13 PM

## 2015-12-25 NOTE — Progress Notes (Signed)
Nursing Progress Note 7P-7A  D) Patient presents preoccupied and anxious. Patient is minimal with interaction and forwards very little when prompted. Patient denies SI/HI and AVH. Patient denies pain. Patient without complaints or concerns. Patient contracts for safety at this time. Medication for sleep requested.  A) PRN trazodone given as prescribed. Emotional support given. Patient on Q safety checks. Opportunities for questions or concerns presented to patient.  R) Patient resting in bed without complaint. Patient remains safe on the unit at this time. Will continue to monitor.

## 2015-12-25 NOTE — Progress Notes (Signed)
  [facility] Adult [facility] Management Discharge Plan :  Will you be returning to the same living situation after discharge:  Yes,  home At discharge, do you have transportation home?: Yes,  family member Do you have the ability to pay for your medications: Yes,  mental health  Release of information consent forms completed and submitted to medical records by CSW.  Patient to Follow up at: Follow-up Information    [facility] Follow up on 12/30/2015.   Why:  Hospital follow up appointment for therapy and medication management services on Monday Nov. 27th between 8-10am. Please bring any updated proof of income and call if you need to reschedule. Ask about [employer] copywriter[employer] Treatment Team (ACTT) services. Contact information: [address] [place] [address] [phone]           Next level of care provider has access to [facility] Link:no  Safety Planning and Suicide Prevention discussed: Yes,  SPE completed with pt's mother. SPI pamphlet and Mobile Crisis information provided to pt.  Have you used any form of tobacco in the last 30 days? (Cigarettes, Smokeless Tobacco, Cigars, and/or Pipes): Yes  Has patient been referred to the Quitline?: Patient refused referral  Patient has been referred for addiction treatment: Yes  Rhoda Waldvogel N Smart LCSW 12/25/2015, 11:36 AM

## 2016-01-03 DEATH — deceased
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
